# Patient Record
Sex: Female | Born: 1935 | Race: White | Hispanic: No | Smoking: Former smoker
Health system: Southern US, Community
[De-identification: ages and names within clinical notes are randomized; demographics above are authoritative.]

## PROBLEM LIST (undated history)

## (undated) DIAGNOSIS — E785 Hyperlipidemia, unspecified: Secondary | ICD-10-CM

## (undated) DIAGNOSIS — R32 Unspecified urinary incontinence: Secondary | ICD-10-CM

## (undated) DIAGNOSIS — J449 Chronic obstructive pulmonary disease, unspecified: Secondary | ICD-10-CM

## (undated) DIAGNOSIS — E039 Hypothyroidism, unspecified: Secondary | ICD-10-CM

## (undated) DIAGNOSIS — I5032 Chronic diastolic (congestive) heart failure: Secondary | ICD-10-CM

## (undated) DIAGNOSIS — I10 Essential (primary) hypertension: Secondary | ICD-10-CM

---

## 2020-01-04 LAB — PULMONARY FUNCTION TEST

## 2021-02-20 ENCOUNTER — Ambulatory Visit (INDEPENDENT_AMBULATORY_CARE_PROVIDER_SITE_OTHER): Payer: Medicare Other | Admitting: Orthopaedic Surgery

## 2021-02-20 ENCOUNTER — Encounter: Payer: Self-pay | Admitting: Orthopaedic Surgery

## 2021-02-20 ENCOUNTER — Ambulatory Visit (INDEPENDENT_AMBULATORY_CARE_PROVIDER_SITE_OTHER): Payer: Medicare Other

## 2021-02-20 ENCOUNTER — Other Ambulatory Visit: Payer: Self-pay

## 2021-02-20 DIAGNOSIS — G8929 Other chronic pain: Secondary | ICD-10-CM | POA: Diagnosis not present

## 2021-02-20 DIAGNOSIS — M545 Low back pain, unspecified: Secondary | ICD-10-CM

## 2021-02-20 NOTE — Progress Notes (Signed)
Office Visit Note   Patient: Valerie Bowman           Date of Birth: October 17, 1935           MRN: [id number] Visit Date: 02/20/2021              Requested by: No referring provider defined for this encounter. PCP: No primary care provider on file.   Assessment & Plan: Visit Diagnoses:  1. Chronic right-sided low back pain, unspecified whether sciatica present   2. Chronic right-sided low back pain without sciatica     Plan: Valerie Bowman is 85 years old and accompanied by her daughter.  She is presently a resident at assisted living at the [facility] facility.  She notes she has a chronic history of instability and balance issues and uses a walker but is complaining of right-sided low back pain.  She does not have any radicular pain.  X-rays demonstrate diffuse degenerative changes lumbar spine with significant narrowing of the disc spaces without listhesis.  I suspect her pain is related to her lumbar spine and the degenerative changes.  We will try a course of physical therapy at [facility] and obtain an MRI scan of the lumbar spine as she might be a candidate for an epidural steroid injection.  All questions were answered  Follow-Up Instructions: Return After MRI scan lumbar spine.   Orders:  Orders Placed This Encounter  Procedures   XR Lumbar Spine 2-3 Views   XR Pelvis 1-2 Views   No orders of the defined types were placed in this encounter.     Procedures: No procedures performed   Clinical Data: No additional findings.   Subjective: Chief Complaint  Patient presents with   Lower Back - Pain  Patient presents today for right lower back pain. She said that it has been hurting for a year. No known injury. Her pain stays in her back, but is worse with standing. She get relief with sitting and lying down. No previous back surgery. She lives at [facility]. She takes over the counter pain medicine as needed.   HPI  Review of Systems   Objective: Vital Signs:  There were no vitals taken for this visit.  Physical Exam Constitutional:      Appearance: She is well-developed.  Eyes:     Pupils: Pupils are equal, round, and reactive to light.  Pulmonary:     Effort: Pulmonary effort is normal.  Skin:    General: Skin is warm and dry.  Neurological:     Mental Status: She is alert and oriented to person, place, and time.  Psychiatric:        Behavior: Behavior normal.    Ortho Exam awake alert and oriented x3.  Comfortable sitting.  Does use in a walker for ambulation for balance problems.  Straight leg raise negative.  Reflexes are symmetrical.  Does have some altered sensibility at the tips of her toes related to neuropathy but appear to have good strength.  No pain with range of motion of either hip or loss of motion.  No knee pain.  No localized areas of tenderness about either thigh or leg.  Very minimal percussible tenderness of lumbar spine.  No pain over either SI joint  Specialty Comments:  No specialty comments available.  Imaging: XR Lumbar Spine 2-3 Views  Result Date: 02/20/2021 AP and lateral lumbar spine demonstrates a very minimal right-sided degenerative scoliosis.  There is diffuse degenerative disc disease throughout the  lumbar spine with anterior and posterior osteophytes and subchondral sclerosis.  All consistent with advanced degenerative arthritis.  No evidence of compression fracture.  Diffuse calcification of the abdominal aorta without aneurysmal dilatation.  XR Pelvis 1-2 Views  Result Date: 02/20/2021 AP pelvis demonstrates mild degenerative changes of the right and left hip.  No ectopic calcification or acute changes.  Very minimal decrease in the joint space on the right.  There is some bony protuberance of the acetabulum    PMFS History: Patient Active Problem List   Diagnosis Date Noted   Low back pain 02/20/2021   History reviewed. No pertinent past medical history.  History reviewed. No pertinent family  history.  History reviewed. No pertinent surgical history. Social History   Occupational History   Not on file  Tobacco Use   Smoking status: Not on file   Smokeless tobacco: Not on file  Substance and Sexual Activity   Alcohol use: Not on file   Drug use: Not on file   Sexual activity: Not on file

## 2021-02-20 NOTE — Addendum Note (Signed)
Addended by: Wendi Maya on: 02/20/2021 04:45 PM   Modules accepted: Orders

## 2021-02-27 ENCOUNTER — Other Ambulatory Visit: Payer: Self-pay

## 2021-02-27 ENCOUNTER — Encounter (HOSPITAL_COMMUNITY): Payer: Self-pay

## 2021-02-27 ENCOUNTER — Inpatient Hospital Stay (HOSPITAL_COMMUNITY)
Admission: EM | Admit: 2021-02-27 | Discharge: 2021-03-01 | DRG: 190 | Disposition: A | Discharge status: Home / Self Care | Payer: Medicare Other | Attending: Family Medicine | Admitting: Family Medicine

## 2021-02-27 ENCOUNTER — Emergency Department (HOSPITAL_COMMUNITY): Payer: Medicare Other

## 2021-02-27 DIAGNOSIS — E785 Hyperlipidemia, unspecified: Secondary | ICD-10-CM | POA: Diagnosis present

## 2021-02-27 DIAGNOSIS — I10 Essential (primary) hypertension: Secondary | ICD-10-CM | POA: Diagnosis present

## 2021-02-27 DIAGNOSIS — I110 Hypertensive heart disease with heart failure: Secondary | ICD-10-CM | POA: Diagnosis present

## 2021-02-27 DIAGNOSIS — Z87891 Personal history of nicotine dependence: Secondary | ICD-10-CM | POA: Diagnosis not present

## 2021-02-27 DIAGNOSIS — J9601 Acute respiratory failure with hypoxia: Secondary | ICD-10-CM | POA: Diagnosis present

## 2021-02-27 DIAGNOSIS — J449 Chronic obstructive pulmonary disease, unspecified: Secondary | ICD-10-CM | POA: Diagnosis present

## 2021-02-27 DIAGNOSIS — Z79890 Hormone replacement therapy (postmenopausal): Secondary | ICD-10-CM | POA: Diagnosis not present

## 2021-02-27 DIAGNOSIS — E039 Hypothyroidism, unspecified: Secondary | ICD-10-CM | POA: Diagnosis present

## 2021-02-27 DIAGNOSIS — J00 Acute nasopharyngitis [common cold]: Secondary | ICD-10-CM | POA: Diagnosis present

## 2021-02-27 DIAGNOSIS — Z66 Do not resuscitate: Secondary | ICD-10-CM | POA: Diagnosis present

## 2021-02-27 DIAGNOSIS — I5032 Chronic diastolic (congestive) heart failure: Secondary | ICD-10-CM | POA: Diagnosis present

## 2021-02-27 DIAGNOSIS — Z9981 Dependence on supplemental oxygen: Secondary | ICD-10-CM

## 2021-02-27 DIAGNOSIS — R0602 Shortness of breath: Secondary | ICD-10-CM | POA: Diagnosis present

## 2021-02-27 DIAGNOSIS — Z20822 Contact with and (suspected) exposure to covid-19: Secondary | ICD-10-CM | POA: Diagnosis present

## 2021-02-27 DIAGNOSIS — J441 Chronic obstructive pulmonary disease with (acute) exacerbation: Principal | ICD-10-CM | POA: Diagnosis present

## 2021-02-27 HISTORY — DX: Hyperlipidemia, unspecified: E78.5

## 2021-02-27 HISTORY — DX: Unspecified urinary incontinence: R32

## 2021-02-27 HISTORY — DX: Chronic obstructive pulmonary disease, unspecified: J44.9

## 2021-02-27 HISTORY — DX: Chronic diastolic (congestive) heart failure: I50.32

## 2021-02-27 HISTORY — DX: Hypothyroidism, unspecified: E03.9

## 2021-02-27 HISTORY — DX: Essential (primary) hypertension: I10

## 2021-02-27 LAB — RESP PANEL BY RT-PCR (FLU A&B, COVID) ARPGX2
Influenza A by PCR: NEGATIVE
Influenza B by PCR: NEGATIVE
SARS Coronavirus 2 by RT PCR: NEGATIVE

## 2021-02-27 LAB — CBC WITH DIFFERENTIAL/PLATELET
Abs Immature Granulocytes: 0.03 10*3/uL (ref 0.00–0.07)
Basophils Absolute: 0.1 10*3/uL (ref 0.0–0.1)
Basophils Relative: 1 %
Eosinophils Absolute: 0.4 10*3/uL (ref 0.0–0.5)
Eosinophils Relative: 4 %
HCT: 37.7 % (ref 36.0–46.0)
Hemoglobin: 12.2 g/dL (ref 12.0–15.0)
Immature Granulocytes: 0 %
Lymphocytes Relative: 17 %
Lymphs Abs: 1.6 10*3/uL (ref 0.7–4.0)
MCH: 31.4 pg (ref 26.0–34.0)
MCHC: 32.4 g/dL (ref 30.0–36.0)
MCV: 96.9 fL (ref 80.0–100.0)
Monocytes Absolute: 0.7 10*3/uL (ref 0.1–1.0)
Monocytes Relative: 8 %
Neutro Abs: 6.5 10*3/uL (ref 1.7–7.7)
Neutrophils Relative %: 70 %
Platelets: 237 10*3/uL (ref 150–400)
RBC: 3.89 MIL/uL (ref 3.87–5.11)
RDW: 14.2 % (ref 11.5–15.5)
WBC: 9.3 10*3/uL (ref 4.0–10.5)
nRBC: 0 % (ref 0.0–0.2)

## 2021-02-27 LAB — BASIC METABOLIC PANEL
Anion gap: 7 (ref 5–15)
BUN: 21 mg/dL (ref 8–23)
CO2: 32 mmol/L (ref 22–32)
Calcium: 9 mg/dL (ref 8.9–10.3)
Chloride: 96 mmol/L — ABNORMAL LOW (ref 98–111)
Creatinine, Ser: 0.64 mg/dL (ref 0.44–1.00)
GFR, Estimated: >60 mL/min (ref >60)
Glucose, Bld: 116 mg/dL — ABNORMAL HIGH (ref 70–99)
Potassium: 3.8 mmol/L (ref 3.5–5.1)
Sodium: 135 mmol/L (ref 135–145)

## 2021-02-27 LAB — BRAIN NATRIURETIC PEPTIDE: B Natriuretic Peptide: 72.7 pg/mL (ref 0.0–100.0)

## 2021-02-27 LAB — TROPONIN I (HIGH SENSITIVITY)
Troponin I (High Sensitivity): 4 ng/L (ref <18)
Troponin I (High Sensitivity): 4 ng/L (ref <18)

## 2021-02-27 IMAGING — DX DG CHEST 1V PORT
1 series · 1 of 1 positions shown · non-contrast
Comparison: None.

CLINICAL DATA: Shortness of breath and wheezing

EXAM:
PORTABLE CHEST 1 VIEW

[chest ap]
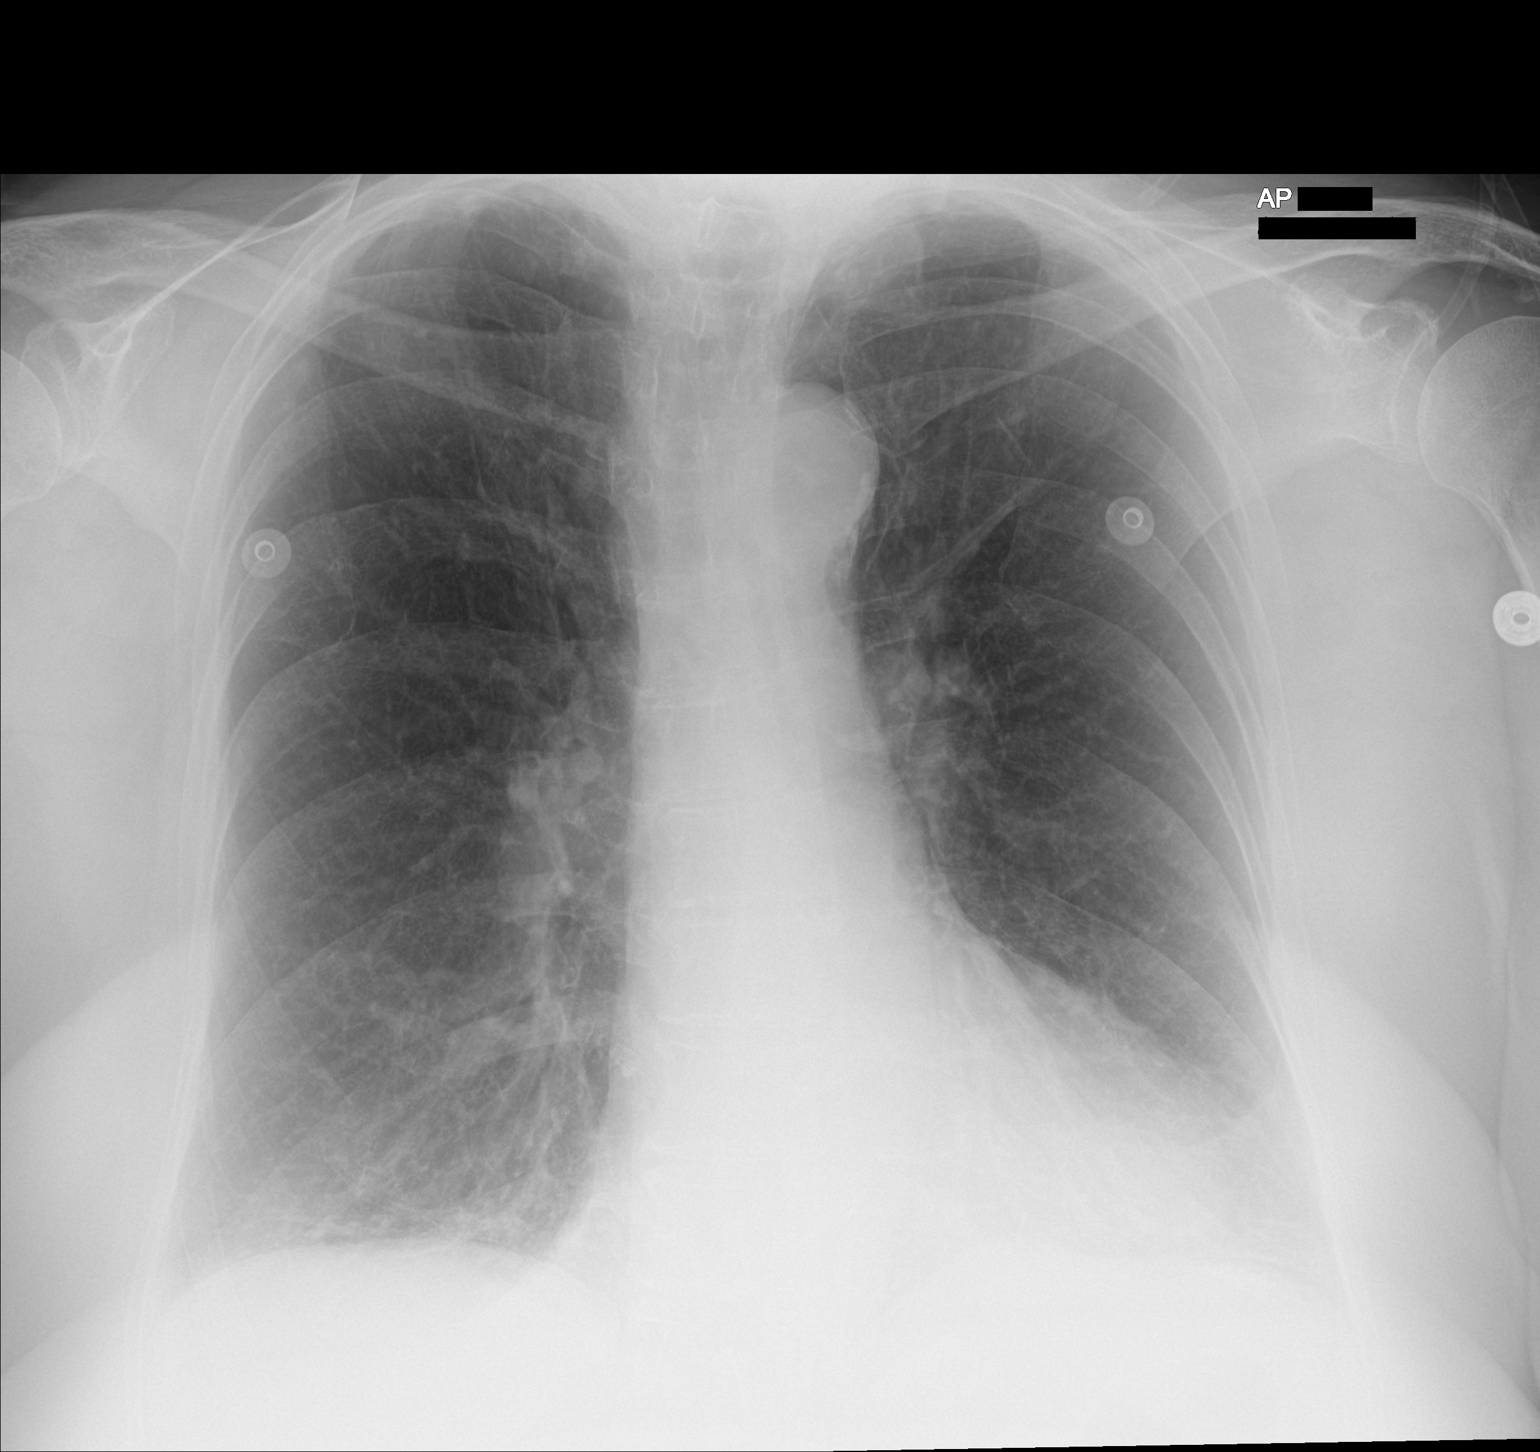

[1 of 1 positions shown; findings below may reference images not displayed]

FINDINGS: Cardiac shadow is within normal limits. Aortic calcifications are
noted. Lungs are hyper aerated bilaterally. No focal infiltrate or
sizable effusion is seen. Mild apical scarring is noted bilaterally.
IMPRESSION: No acute abnormality noted. Changes of COPD without acute
abnormality.

## 2021-02-27 MED ORDER — SODIUM CHLORIDE 0.9 % IV SOLN
500.0000 mg | INTRAVENOUS | Status: DC
  Administered 2021-02-27: 23:00:00 500 mg via INTRAVENOUS
  Filled 2021-02-27: qty 5

## 2021-02-27 MED ORDER — ASPIRIN 81 MG PO CHEW: 81.0000 mg | CHEWABLE_TABLET | Freq: Once | ORAL | Status: DC

## 2021-02-27 MED ORDER — ACETAMINOPHEN 650 MG RE SUPP: 650.0000 mg | Freq: Four times a day (QID) | RECTAL | Status: DC | PRN

## 2021-02-27 MED ORDER — ALBUTEROL SULFATE (2.5 MG/3ML) 0.083% IN NEBU
2.5000 mg | INHALATION_SOLUTION | Freq: Once | RESPIRATORY_TRACT | Status: AC
  Administered 2021-02-27: 21:00:00 2.5 mg via RESPIRATORY_TRACT
  Filled 2021-02-27: qty 3

## 2021-02-27 MED ORDER — METHYLPREDNISOLONE SODIUM SUCC 125 MG IJ SOLR
80.0000 mg | Freq: Four times a day (QID) | INTRAMUSCULAR | Status: DC
  Administered 2021-02-28 (×2): 80 mg via INTRAVENOUS
  Filled 2021-02-27 (×2): qty 2

## 2021-02-27 MED ORDER — IPRATROPIUM-ALBUTEROL 0.5-2.5 (3) MG/3ML IN SOLN
3.0000 mL | Freq: Four times a day (QID) | RESPIRATORY_TRACT | Status: DC
  Filled 2021-02-27: qty 3

## 2021-02-27 MED ORDER — ENALAPRIL MALEATE 10 MG PO TABS
20.0000 mg | ORAL_TABLET | Freq: Two times a day (BID) | ORAL | Status: DC
  Administered 2021-02-28 – 2021-03-01 (×3): 20 mg via ORAL
  Filled 2021-02-27 (×4): qty 2

## 2021-02-27 MED ORDER — ACETAMINOPHEN 325 MG PO TABS: 650.0000 mg | ORAL_TABLET | Freq: Four times a day (QID) | ORAL | Status: DC | PRN

## 2021-02-27 MED ORDER — ALBUTEROL SULFATE (2.5 MG/3ML) 0.083% IN NEBU: 2.5000 mg | INHALATION_SOLUTION | RESPIRATORY_TRACT | Status: DC | PRN

## 2021-02-27 NOTE — ED Triage Notes (Signed)
EMS reports from [facility], c/o SOB since last night, Hx of COPD. Used inhalers today with some relief. Pt uses 2ltrs at night. Wheezes noted.  BP 170/74 HR 90 RR 24 Sp02 96  18ga LAC 5mg  Albuterol 0.5mg  Atrovent 125mg  Solumedrol given enroute with effect

## 2021-02-27 NOTE — ED Provider Notes (Signed)
[facility] EMERGENCY DEPARTMENT Provider Note  CSN: [id number] Arrival date & time: 02/27/21 1803    History Chief Complaint  Patient presents with   Shortness of Breath    Valerie Bowman is a 85 y.o. female who recently moved from [place] to ALF at [facility] here with increasing SOB and wheezing since last night. She is unsure if she was running a fever or coughing. She has history of COPD, on oxygen at night, Stiolto and albuterol. She was given DuoNeb and solumedrol with EMS and reports improvement. Per paperwork from ALF, she was hypoxic on room air earlier today, now 93% on RA. Denies CP, no prior cardiac history.    History reviewed. No pertinent past medical history.  History reviewed. No pertinent surgical history.  History reviewed. No pertinent family history.      Home Medications Prior to Admission medications   Not on File     Allergies    Patient has no allergy information on record.   Review of Systems   Review of Systems A comprehensive review of systems was completed and negative except as noted in HPI.    Physical Exam BP (!) 151/76    Pulse 85    Temp 97.9 F (36.6 C) (Oral)    Resp 19    Ht 5\' 7"  (1.702 m)    Wt 85.3 kg    SpO2 93%    BMI 29.44 kg/m   Physical Exam Vitals and nursing note reviewed.  Constitutional:      Appearance: Normal appearance.  HENT:     Head: Normocephalic and atraumatic.     Nose: Nose normal.     Mouth/Throat:     Mouth: Mucous membranes are moist.  Eyes:     Extraocular Movements: Extraocular movements intact.     Conjunctiva/sclera: Conjunctivae normal.  Cardiovascular:     Rate and Rhythm: Normal rate.  Pulmonary:     Effort: Pulmonary effort is normal.     Breath sounds: Decreased breath sounds, wheezing and rhonchi present.  Abdominal:     General: Abdomen is flat.     Palpations: Abdomen is soft.     Tenderness: There is no abdominal tenderness.  Musculoskeletal:        General: No swelling.  Normal range of motion.     Cervical back: Neck supple.     Right lower leg: No edema.     Left lower leg: No edema.  Skin:    General: Skin is warm and dry.  Neurological:     General: No focal deficit present.     Mental Status: She is alert.  Psychiatric:        Mood and Affect: Mood normal.     ED Results / Procedures / Treatments   Labs (all labs ordered are listed, but only abnormal results are displayed) Labs Reviewed  BASIC METABOLIC PANEL - Abnormal; Notable for the following components:      Result Value   Chloride 96 (*)    Glucose, Bld 116 (*)    All other components within normal limits  RESP PANEL BY RT-PCR (FLU A&B, COVID) [id number]  BRAIN NATRIURETIC PEPTIDE  CBC WITH DIFFERENTIAL/PLATELET  TROPONIN I (HIGH SENSITIVITY)  TROPONIN I (HIGH SENSITIVITY)    EKG EKG Interpretation  Date/Time:  Tuesday February 27 2021 19:39:36 EST Ventricular Rate:  87 PR Interval:  175 QRS Duration: 96 QT Interval:  367 QTC Calculation: 442 R Axis:   64 Text Interpretation: Sinus rhythm  Normal ECG No old tracing to compare Confirmed by Susy Frizzle [phone]) on 02/27/2021 8:25:33 PM  Radiology DG Chest Port 1 View  Result Date: 02/27/2021 CLINICAL DATA:  Shortness of breath and wheezing EXAM: PORTABLE CHEST 1 VIEW COMPARISON:  None. FINDINGS: Cardiac shadow is within normal limits. Aortic calcifications are noted. Lungs are hyper aerated bilaterally. No focal infiltrate or sizable effusion is seen. Mild apical scarring is noted bilaterally. IMPRESSION: No acute abnormality noted. Changes of COPD without acute abnormality. Electronically Signed   By: Alcide Clever M.D.   On: 02/27/2021 19:18    Procedures Procedures  Medications Ordered in the ED Medications  albuterol (PROVENTIL) (2.5 MG/3ML) 0.083% nebulizer solution 2.5 mg (2.5 mg Nebulization Given 02/27/21 2054)     MDM Rules/Calculators/A&P MDM Patient with SOB and cough. Will check labs, CXR, EKG, Covid swab.  She appears more comfortable now after nebs enroute.   ED Course  I have reviewed the triage vital signs and the nursing notes.  Pertinent labs & imaging results that were available during my care of the patient were reviewed by me and considered in my medical decision making (see chart for details).  Clinical Course as of 02/27/21 2207  Tue Feb 27, 2021  1930 CXR is clear. CBC is normal.  [CS]  2024 BMP, Trop and BNP are neg. Covid/Flu neg.  [CS]  2033 Wheezing is improved, borderline SpO2 93% on 2L [place]. Air movement is diminished. Will give another neb and then evaluate for dispo.  [CS]  2137 Repeat Trop remains neg.  [CS]  2142 Patient reports dyspnea with walking, SpO2 down to 88% with ambulation. She typically does not require oxygen except when asleep so this is an increase in her usual O2 requirement. Will discuss admission with Hospitalist.  [CS]  2206 Spoke with Dr. Arlean Hopping, Hospitalist, who will evaluate for admission.  [CS]    Clinical Course User Index [CS] [name], MD    Final Clinical Impression(s) / ED Diagnoses Final diagnoses:  COPD exacerbation [facility])    Rx / DC Orders ED Discharge Orders     None        [name], MD 02/27/21 2207

## 2021-02-27 NOTE — H&P (Signed)
History and Physical    PLEASE NOTE THAT DRAGON DICTATION SOFTWARE WAS USED IN THE CONSTRUCTION OF THIS NOTE.   Valerie Bowman ZOX:[id number] DOB: 1935/07/04 DOA: 02/27/2021  PCP: Herschel Senegal, MD  Patient coming from: home   I have personally briefly reviewed patient's old medical records in [facility]  Chief Complaint: Shortness of breath  HPI: Valerie Bowman is a 85 y.o. female with medical history significant for COPD, essential hypertension, chronic diastolic heart failure, hyperlipidemia, who is admitted to [facility] on 02/27/2021 with acute COPD exacerbation associated with acute hypoxic respiratory distress after presenting from home to [facility] ED complaining of shortness of breath.   The patient reports 2 days of progressive shortness of breath associated with new onset wheezing as well as new onset nonproductive cough.  Denies any associated orthopnea, PND, or new onset peripheral edema.  She also denies any associated chest pain, diaphoresis, palpitations, nausea, vomiting.  No recent hemoptysis, new lower extremity erythema, or calf tenderness.  She also denies any associated subjective fever, chills, rigors, or generalized myalgias.  No recent trauma or rash.   She confirms a known history of COPD and reports good compliance with her outpatient respiratory regimen which includes scheduled Stioloto as well as as needed albuterol inhaler.  She confirms that she is a former smoker, having completely quit smoking in 1995 after previously smoking approximately pack per day over the preceding 30 years.  Over the last 2 days, she notes increasing frequency of use of her prn albuterol inhaler, but notes no significant improvement in her shortness of breath with this measure.    The patient reports that she uses 2 L nasal cannula at night only, denying any baseline diurnal supplemental oxygen requirements.  Of note, the patient recently moved to [place] from the state of  [place] in order to be closer to her daughter.      ED Course:  Vital signs in the ED were notable for the following: Afebrile; heart rate 82-95; blood pressure 126/67 -151/76; respiratory rate 15-20, oxygen saturation initially noted to be 88% on room air, with subsequent provement to 93 to 97% on 2 L nasal cannula.  Labs were notable for the following: BMP notable for bicarbonate 3.  BNP 73, without prior data point of the lower point of comparison.  High-sensitivity troponin I x2 values were both found to be 4.  CBC notable for platelets of 2300, hemoglobin 12.2.  COVID-19/[place] PCR found to be negative.  Imaging and additional notable ED work-up: Sinus rhythm with heart rate 87, normal intervals, no evidence of T wave or ST changes.  Chest x-ray shows no evidence of acute cardiopulmonary process, including no evidence of infiltrate, edema, effusion, or pneumothorax.  While in the ED, the following were administered: Albuterol nebulizer x1.  Subsequently, the patient is admitted to the med telemetry unit at [facility] for further evaluation management of acute COPD exacerbation associated with acute hypoxic respiratory distress.     Review of Systems: As per HPI otherwise 10 point review of systems negative.   Past Medical History:  Diagnosis Date   Acquired hypothyroidism    Chronic diastolic heart failure (HCC)    COPD (chronic obstructive pulmonary disease) (HCC)    Essential hypertension    Hyperlipidemia    Urinary incontinence     History reviewed. No pertinent surgical history.  Social History:  reports that she quit smoking about 27 years ago. Her smoking use included cigarettes. She has a  30.00 pack-year smoking history. She has never used smokeless tobacco. She reports that she does not currently use alcohol. She reports that she does not use drugs.   Not on File  History reviewed. No pertinent family history.   Home medications: Outpatient medications include  prn albuterol, enalapril, Norvasc, daily baby aspirin, Synthroid, Toprol-XL.   Objective    Physical Exam: Vitals:   02/27/21 2000 02/27/21 2030 02/27/21 2100 02/27/21 2134  BP: 126/67 (!) 147/72 129/64 (!) 151/76  Pulse: 85 84 84 85  Resp: 20 15 19 19   Temp:      TempSrc:      SpO2: 92% 94% 97% 93%  Weight:      Height:        General: appears to be stated age; alert, oriented; mildly increased work of breathing noted Skin: warm, dry, no rash Head:  AT/[place] Mouth:  Oral mucosa membranes appear moist, normal dentition Neck: supple; trachea midline Heart:  RRR; did not appreciate any M/R/G Lungs: Bilateral expiratory wheezes appreciated, otherwise, CTAB, did not appreciate any, rales, or rhonchi Abdomen: + BS; soft, ND, NT Vascular: 2+ pedal pulses b/l; 2+ radial pulses b/l Extremities: no peripheral edema, no muscle wasting Neuro: strength and sensation intact in upper and lower extremities b/l     Labs on Admission: I have personally reviewed following labs and imaging studies  CBC: Recent Labs  Lab 02/27/21 1829  WBC 9.3  NEUTROABS 6.5  HGB 12.2  HCT 37.7  MCV 96.9  PLT 237   Basic Metabolic Panel: Recent Labs  Lab 02/27/21 1829  NA 135  K 3.8  CL 96*  CO2 32  GLUCOSE 116*  BUN 21  CREATININE 0.64  CALCIUM 9.0   GFR: Estimated Creatinine Clearance: 59.8 mL/min (by C-G formula based on SCr of 0.64 mg/dL). Liver Function Tests: No results for input(s): AST, ALT, ALKPHOS, BILITOT, PROT, ALBUMIN in the last 168 hours. No results for input(s): LIPASE, AMYLASE in the last 168 hours. No results for input(s): AMMONIA in the last 168 hours. Coagulation Profile: No results for input(s): INR, PROTIME in the last 168 hours. Cardiac Enzymes: No results for input(s): CKTOTAL, CKMB, CKMBINDEX, TROPONINI in the last 168 hours. BNP (last 3 results) No results for input(s): PROBNP in the last 8760 hours. HbA1C: No results for input(s): HGBA1C in the last 72  hours. CBG: No results for input(s): GLUCAP in the last 168 hours. Lipid Profile: No results for input(s): CHOL, HDL, LDLCALC, TRIG, CHOLHDL, LDLDIRECT in the last 72 hours. Thyroid Function Tests: No results for input(s): TSH, T4TOTAL, FREET4, T3FREE, THYROIDAB in the last 72 hours. Anemia Panel: No results for input(s): VITAMINB12, FOLATE, FERRITIN, TIBC, IRON, RETICCTPCT in the last 72 hours. Urine analysis: No results found for: COLORURINE, APPEARANCEUR, LABSPEC, PHURINE, GLUCOSEU, HGBUR, BILIRUBINUR, KETONESUR, PROTEINUR, UROBILINOGEN, NITRITE, LEUKOCYTESUR  Radiological Exams on Admission: DG Chest Port 1 View  Result Date: 02/27/2021 CLINICAL DATA:  Shortness of breath and wheezing EXAM: PORTABLE CHEST 1 VIEW COMPARISON:  None. FINDINGS: Cardiac shadow is within normal limits. Aortic calcifications are noted. Lungs are hyper aerated bilaterally. No focal infiltrate or sizable effusion is seen. Mild apical scarring is noted bilaterally. IMPRESSION: No acute abnormality noted. Changes of COPD without acute abnormality. Electronically Signed   By: 03/01/2021 M.D.   On: 02/27/2021 19:18     EKG: Independently reviewed, with result as described above.    Assessment/Plan    Principal Problem:   Acute exacerbation of chronic obstructive pulmonary disease (  COPD) (HCC) Active Problems:   SOB (shortness of breath)   Acquired hypothyroidism   Essential hypertension   Chronic diastolic heart failure (HCC)     #) Acute COPD exacerbation: In the setting of a known history of COPD, the patient presents with 2 days of progressive shortness of breath with associated new onset wheezing, with chest x-ray showed no evidence of acute cardiac pulmonary process.  In the context of no known baseline diet enteral supplemental oxygen requirements, this presentation is also associated with acute hypoxic respiratory distress, as further detailed below.  Unclear etiology leading to her exacerbation,  with clear chest x-ray on presentation, underlying infectious etiology, and compliance with home respiratory regimen, as above.  She confirms that she is a former smoker, having completely quit smoking greater than 25 years ago.   Plan: Scheduled DuoNebs, as needed albuterol nebulizers, Solu-Medrol, azithromycin.  Check VBG.  Monitor on telemetry.  Monitor continuous pulse oximetry.  Add on procalcitonin.  Repeat BMP/CBC in the morning.  Add on serum magnesium level.  Check serum phosphorus level.       #) Acute hypoxic respiratory distress: in the context of acute respiratory symptoms and no known baseline diurnal supplemental O2 requirements, presenting O2 sat of 88% on room air, with subsequent improvement into the mid 90s on 2 L nasal cannula, thereby meeting criteria for acute hypoxic respiratory distress as opposed to acute hypoxic respiratory failure at this time. Appears to be on basis of acute COPD exacerbation, as above.  Presenting CXR shows no evidence of acute cardiopulmonary process, including no evidence of infiltrate, edema, effusion, or pneumothorax.   In terms of other considered etiologies, ACS appears less likely at this time in the absence of any recent CP and in the context of negative troponin and presenting EKG showing no e/o acute ischemic process. No clinical or radiographic evidence to suggest acutely decompensated heart failure at this time. Clinically, presentation is less suggestive of acute PE at this time. COVID-19/Influenza PCR were negative when checked today.   Plan: further evaluation/management of presenting acute COPD exacerbation, as above, including scheduled nebs, as needed nebs, steroids. Monitor continuous pulse ox with prn supplemental O2 to maintain O2 sats greater than or equal to 92%. monitor on telemetry. CMP/CBC in the AM. Check serum Mg and Phos levels. Check blood gas.  incentive spirometry.  Add on procalcitonin level.       #) Essential  Hypertension: documented h/o such, with outpatient antihypertensive regimen including enalapril and Norvasc.  SBP's in the ED today: 120s to 150s mmHg.   Plan: Close monitoring of subsequent BP via routine VS. resume home antihypertensive medications       #) Chronic diastolic heart failure: Per chart review, I found documentation of a history of chronic diastolic heart failure although been unable to locate specific prior echocardiogram results.  Patient conveys that she is on no scheduled diuretic medications at home. No clinical, laboratory, or radiographic evidence to suggest acutely decompensated heart failure at this time.     Plan: monitor strict I's & O's and daily weights. Repeat BMP in AM. Check serum mag level.        #) acquired hypothyroidism: documented h/o such, on Synthroid as outpatient.   Plan: cont home Synthroid.      DVT prophylaxis: SCD's   Code Status: DNR (per my discussions with the patient this evening) Family Communication: I discussed the patient's case with her daughter, who is present at bedside Disposition Plan: Per Rounding  Team Consults called: none;  Admission status: Inpatient; med telemetry  Warrants inpatient status on basis of further evaluation and management of acute hypoxic respiratory distress in setting of acute COPD exacerbation requiring escalation of respiratory management given progression of her shortness of breath as an outpatient and spite of increased frequency of prn albuterol use, including provision of supplemental oxygen in the context of no known baseline diet and renal O2 demands.   PLEASE NOTE THAT DRAGON DICTATION SOFTWARE WAS USED IN THE CONSTRUCTION OF THIS NOTE.   Chaney Born Nickholas Goldston DO [facility] From 7PM - 7AM   02/27/2021, 10:12 PM

## 2021-02-27 NOTE — ED Notes (Signed)
Per pt, pt ambulates w/ RW on RA @ baseline. Pt able to ambulate w limited assist on RA. O2 sat > 91%. Noted O2 desat to 88% post-ambulation. Pt placed back on 2L. O2 sat up to 95%.

## 2021-02-28 DIAGNOSIS — I10 Essential (primary) hypertension: Secondary | ICD-10-CM | POA: Diagnosis present

## 2021-02-28 DIAGNOSIS — E039 Hypothyroidism, unspecified: Secondary | ICD-10-CM | POA: Diagnosis present

## 2021-02-28 DIAGNOSIS — I5032 Chronic diastolic (congestive) heart failure: Secondary | ICD-10-CM | POA: Diagnosis present

## 2021-02-28 DIAGNOSIS — R0602 Shortness of breath: Secondary | ICD-10-CM | POA: Diagnosis present

## 2021-02-28 LAB — COMPREHENSIVE METABOLIC PANEL
ALT: 16 U/L (ref 0–44)
AST: 19 U/L (ref 15–41)
Albumin: 3.6 g/dL (ref 3.5–5.0)
Alkaline Phosphatase: 59 U/L (ref 38–126)
Anion gap: 7 (ref 5–15)
BUN: 24 mg/dL — ABNORMAL HIGH (ref 8–23)
CO2: 32 mmol/L (ref 22–32)
Calcium: 8.9 mg/dL (ref 8.9–10.3)
Chloride: 97 mmol/L — ABNORMAL LOW (ref 98–111)
Creatinine, Ser: 0.68 mg/dL (ref 0.44–1.00)
GFR, Estimated: >60 mL/min (ref >60)
Glucose, Bld: 146 mg/dL — ABNORMAL HIGH (ref 70–99)
Potassium: 4.2 mmol/L (ref 3.5–5.1)
Sodium: 136 mmol/L (ref 135–145)
Total Bilirubin: 0.7 mg/dL (ref 0.3–1.2)
Total Protein: 6.7 g/dL (ref 6.5–8.1)

## 2021-02-28 LAB — CBC WITH DIFFERENTIAL/PLATELET
Abs Immature Granulocytes: 0.02 10*3/uL (ref 0.00–0.07)
Basophils Absolute: 0 10*3/uL (ref 0.0–0.1)
Basophils Relative: 0 %
Eosinophils Absolute: 0 10*3/uL (ref 0.0–0.5)
Eosinophils Relative: 0 %
HCT: 37.6 % (ref 36.0–46.0)
Hemoglobin: 12.1 g/dL (ref 12.0–15.0)
Immature Granulocytes: 0 %
Lymphocytes Relative: 5 %
Lymphs Abs: 0.4 10*3/uL — ABNORMAL LOW (ref 0.7–4.0)
MCH: 30.9 pg (ref 26.0–34.0)
MCHC: 32.2 g/dL (ref 30.0–36.0)
MCV: 96.2 fL (ref 80.0–100.0)
Monocytes Absolute: 0.1 10*3/uL (ref 0.1–1.0)
Monocytes Relative: 1 %
Neutro Abs: 7.3 10*3/uL (ref 1.7–7.7)
Neutrophils Relative %: 94 %
Platelets: 190 10*3/uL (ref 150–400)
RBC: 3.91 MIL/uL (ref 3.87–5.11)
RDW: 14.1 % (ref 11.5–15.5)
WBC: 7.8 10*3/uL (ref 4.0–10.5)
nRBC: 0 % (ref 0.0–0.2)

## 2021-02-28 LAB — BLOOD GAS, VENOUS
Acid-Base Excess: 5.9 mmol/L — ABNORMAL HIGH (ref 0.0–2.0)
Bicarbonate: 32.1 mmol/L — ABNORMAL HIGH (ref 20.0–28.0)
O2 Saturation: 96 %
Patient temperature: 98.6
pCO2, Ven: 56 mmHg (ref 44.0–60.0)
pH, Ven: 7.376 (ref 7.250–7.430)
pO2, Ven: 90.3 mmHg — ABNORMAL HIGH (ref 32.0–45.0)

## 2021-02-28 LAB — PROCALCITONIN: Procalcitonin: <0.1 ng/mL

## 2021-02-28 LAB — PHOSPHORUS: Phosphorus: 3.2 mg/dL (ref 2.5–4.6)

## 2021-02-28 LAB — MAGNESIUM: Magnesium: 2.1 mg/dL (ref 1.7–2.4)

## 2021-02-28 MED ORDER — ORAL CARE MOUTH RINSE
15.0000 mL | Freq: Two times a day (BID) | OROMUCOSAL | Status: DC
  Administered 2021-03-01: 12:00:00 15 mL via OROMUCOSAL

## 2021-02-28 MED ORDER — AZITHROMYCIN 250 MG PO TABS
500.0000 mg | ORAL_TABLET | Freq: Every day | ORAL | Status: DC
  Administered 2021-02-28: 21:00:00 500 mg via ORAL
  Filled 2021-02-28: qty 2

## 2021-02-28 MED ORDER — SENNA 8.6 MG PO TABS
1.0000 | ORAL_TABLET | Freq: Two times a day (BID) | ORAL | Status: DC
Start: 2021-02-28 — End: 2021-03-01
  Administered 2021-02-28 – 2021-03-01 (×3): 8.6 mg via ORAL
  Filled 2021-02-28 (×3): qty 1

## 2021-02-28 MED ORDER — METOPROLOL SUCCINATE ER 50 MG PO TB24
50.0000 mg | ORAL_TABLET | Freq: Every day | ORAL | Status: DC
  Administered 2021-02-28: 10:00:00 50 mg via ORAL
  Filled 2021-02-28 (×2): qty 1

## 2021-02-28 MED ORDER — ASPIRIN EC 81 MG PO TBEC
81.0000 mg | DELAYED_RELEASE_TABLET | Freq: Every day | ORAL | Status: DC
Start: 2021-02-28 — End: 2021-02-28

## 2021-02-28 MED ORDER — METHYLPREDNISOLONE SODIUM SUCC 125 MG IJ SOLR
80.0000 mg | Freq: Two times a day (BID) | INTRAMUSCULAR | Status: DC
  Administered 2021-02-28 – 2021-03-01 (×2): 80 mg via INTRAVENOUS
  Filled 2021-02-28 (×2): qty 2

## 2021-02-28 MED ORDER — POLYETHYLENE GLYCOL 3350 17 G PO PACK
17.0000 g | PACK | Freq: Every day | ORAL | Status: DC
  Filled 2021-02-28: qty 1

## 2021-02-28 MED ORDER — CHLORHEXIDINE GLUCONATE 0.12 % MT SOLN
15.0000 mL | Freq: Two times a day (BID) | OROMUCOSAL | Status: DC
  Administered 2021-02-28 – 2021-03-01 (×2): 15 mL via OROMUCOSAL
  Filled 2021-02-28 (×2): qty 15

## 2021-02-28 MED ORDER — AMLODIPINE BESYLATE 5 MG PO TABS
5.0000 mg | ORAL_TABLET | Freq: Every day | ORAL | Status: DC
  Administered 2021-02-28 – 2021-03-01 (×2): 5 mg via ORAL
  Filled 2021-02-28 (×2): qty 1

## 2021-02-28 MED ORDER — IPRATROPIUM-ALBUTEROL 0.5-2.5 (3) MG/3ML IN SOLN
3.0000 mL | Freq: Three times a day (TID) | RESPIRATORY_TRACT | Status: DC
  Administered 2021-02-28 – 2021-03-01 (×4): 3 mL via RESPIRATORY_TRACT
  Filled 2021-02-28 (×4): qty 3

## 2021-02-28 MED ORDER — LEVOTHYROXINE SODIUM 125 MCG PO TABS
125.0000 ug | ORAL_TABLET | Freq: Every day | ORAL | Status: DC
  Administered 2021-02-28 – 2021-03-01 (×2): 125 ug via ORAL
  Filled 2021-02-28 (×2): qty 1

## 2021-02-28 MED ORDER — METHYLPREDNISOLONE SODIUM SUCC 40 MG IJ SOLR: 40.0000 mg | Freq: Four times a day (QID) | INTRAMUSCULAR | Status: DC

## 2021-02-28 MED ORDER — ENALAPRIL MALEATE 10 MG PO TABS: 20.0000 mg | ORAL_TABLET | Freq: Two times a day (BID) | ORAL | Status: DC

## 2021-02-28 NOTE — ED Notes (Signed)
ED TO INPATIENT HANDOFF REPORT  ED Nurse Name and Phone #: Ned Clines Name/Age/Gender Valerie Bowman 85 y.o. female Room/Bed: RESB/RESB  Code Status   Code Status: DNR  Home/SNF/Other Nursing Home Patient oriented to: self, place, and time Is this baseline? Yes   Triage Complete: Triage complete  Chief Complaint Acute exacerbation of chronic obstructive pulmonary disease (COPD) (HCC) [J44.1]  Triage Note EMS reports from [facility] [place], c/o SOB since last night, Hx of COPD. Used inhalers today with some relief. Pt uses 2ltrs at night. Wheezes noted.  BP 170/74 HR 90 RR 24 Sp02 96  18ga LAC 5mg  Albuterol 0.5mg  Atrovent 125mg  Solumedrol given enroute with effect   Allergies Allergies  Allergen Reactions   Penicillins     Other reaction(s): Other (see comments) Childhood    Sulfa Antibiotics     Level of Care/Admitting Diagnosis ED Disposition     ED Disposition  Admit   Condition  --   Comment  Hospital Area: [facility] [[id number]]  Level of Care: Telemetry [5]  Admit to tele based on following criteria: Monitor for Ischemic changes  May admit patient to or [facility] if equivalent level of care is available:: No  Covid Evaluation: Confirmed COVID Negative  Diagnosis: Acute exacerbation of chronic obstructive pulmonary disease (COPD) [facility]) [phone]  Admitting Physician: [facility] [[id number]]  Attending Physician: Angie Fava [[id number]]  Estimated length of stay: past midnight tomorrow  Certification:: I certify this patient will need inpatient services for at least 2 midnights          B Medical/Surgery History Past Medical History:  Diagnosis Date   Acquired hypothyroidism    Chronic diastolic heart failure (HCC)    COPD (chronic obstructive pulmonary disease) (HCC)    Essential hypertension    Hyperlipidemia    Urinary incontinence    History reviewed. No pertinent surgical history.   A IV  Location/Drains/Wounds Patient Lines/Drains/Airways Status     Active Line/Drains/Airways     Name Placement date Placement time Site Days   Peripheral IV 02/27/21 18 G Anterior;Left;Proximal Forearm 02/27/21  1816  Forearm  1   External Urinary Catheter 02/28/21  0218  --  less than 1            Intake/Output Last 24 hours  Intake/Output Summary (Last 24 hours) at 02/28/2021 1359 Last data filed at 02/28/2021 03/02/2021 Gross per 24 hour  Intake 250 ml  Output --  Net 250 ml    Labs/Imaging Results for orders placed or performed during the hospital encounter of 02/27/21 (from the past 48 hour(s))  Brain natriuretic peptide     Status: None   Collection Time: 02/27/21  6:29 PM  Result Value Ref Range   B Natriuretic Peptide 72.7 0.0 - 100.0 pg/mL    Comment: Performed at [facility], [address] [place], [place]  Basic metabolic panel     Status: Abnormal   Collection Time: 02/27/21  6:29 PM  Result Value Ref Range   Sodium 135 135 - 145 mmol/L   Potassium 3.8 3.5 - 5.1 mmol/L   Chloride 96 (L) 98 - 111 mmol/L   CO2 32 22 - 32 mmol/L   Glucose, Bld 116 (H) 70 - 99 mg/dL    Comment: Glucose reference range applies only to samples taken after fasting for at least 8 hours.   BUN 21 8 - 23 mg/dL   Creatinine, Ser 37106 0.44 -  1.00 mg/dL   Calcium 9.0 8.9 - 16.1 mg/dL   GFR, Estimated >09 >60 mL/min    Comment: (NOTE) Calculated using the CKD-EPI Creatinine Equation (2021)    Anion gap 7 5 - 15    Comment: Performed at [facility], [address] [place], [place] [address]  Troponin I (High Sensitivity)     Status: None   Collection Time: 02/27/21  6:29 PM  Result Value Ref Range   Troponin I (High Sensitivity) 4 <18 ng/L    Comment: (NOTE) Elevated high sensitivity troponin I (hsTnI) values and significant  changes across serial measurements may suggest ACS but many other  chronic and acute conditions are known to  elevate hsTnI results.  Refer to the "Links" section for chest pain algorithms and additional  guidance. Performed at [facility], [address] [place], [place] [address]   CBC with Differential     Status: None   Collection Time: 02/27/21  6:29 PM  Result Value Ref Range   WBC 9.3 4.0 - 10.5 K/uL   RBC 3.89 3.87 - 5.11 MIL/uL   Hemoglobin 12.2 12.0 - 15.0 g/dL   HCT 47.8 29.5 - 62.1 %   MCV 96.9 80.0 - 100.0 fL   MCH 31.4 26.0 - 34.0 pg   MCHC 32.4 30.0 - 36.0 g/dL   RDW 30.8 65.7 - 84.6 %   Platelets 237 150 - 400 K/uL   nRBC 0.0 0.0 - 0.2 %   Neutrophils Relative % 70 %   Neutro Abs 6.5 1.7 - 7.7 K/uL   Lymphocytes Relative 17 %   Lymphs Abs 1.6 0.7 - 4.0 K/uL   Monocytes Relative 8 %   Monocytes Absolute 0.7 0.1 - 1.0 K/uL   Eosinophils Relative 4 %   Eosinophils Absolute 0.4 0.0 - 0.5 K/uL   Basophils Relative 1 %   Basophils Absolute 0.1 0.0 - 0.1 K/uL   Immature Granulocytes 0 %   Abs Immature Granulocytes 0.03 0.00 - 0.07 K/uL    Comment: Performed at [facility], [address] [place], [place] [address]  Resp Panel by RT-PCR (Flu A&B, Covid) Nasopharyngeal Swab     Status: None   Collection Time: 02/27/21  6:29 PM   Specimen: Nasopharyngeal Swab; Nasopharyngeal(NP) swabs in vial transport medium  Result Value Ref Range   SARS Coronavirus 2 by RT PCR NEGATIVE NEGATIVE    Comment: (NOTE) SARS-CoV-2 target nucleic acids are NOT DETECTED.  The SARS-CoV-2 RNA is generally detectable in upper respiratory specimens during the acute phase of infection. The lowest concentration of SARS-CoV-2 viral copies this assay can detect is 138 copies/mL. A negative result does not preclude SARS-Cov-2 infection and should not be used as the sole basis for treatment or other patient management decisions. A negative result may occur with  improper specimen collection/handling, submission of specimen other than nasopharyngeal swab, presence  of viral mutation(s) within the areas targeted by this assay, and inadequate number of viral copies(<138 copies/mL). A negative result must be combined with clinical observations, patient history, and epidemiological information. The expected result is Negative.  Fact Sheet for Patients:  BloggerCourse.com  Fact Sheet for Healthcare Providers:  SeriousBroker.it  This test is no t yet approved or cleared by the [place] FDA and  has been authorized for detection and/or diagnosis of SARS-CoV-2 by FDA under an Emergency Use Authorization (EUA). This EUA will remain  in effect (meaning this test can be used) for  the duration of the COVID-19 declaration under Section 564(b)(1) of the Act, 21 U.S.C.section 360bbb-3(b)(1), unless the authorization is terminated  or revoked sooner.       Influenza A by PCR NEGATIVE NEGATIVE   Influenza B by PCR NEGATIVE NEGATIVE    Comment: (NOTE) The Xpert Xpress SARS-CoV-2/FLU/RSV plus assay is intended as an aid in the diagnosis of influenza from Nasopharyngeal swab specimens and should not be used as a sole basis for treatment. Nasal washings and aspirates are unacceptable for Xpert Xpress SARS-CoV-2/FLU/RSV testing.  Fact Sheet for Patients: BloggerCourse.com  Fact Sheet for Healthcare Providers: SeriousBroker.it  This test is not yet approved or cleared by the [place] FDA and has been authorized for detection and/or diagnosis of SARS-CoV-2 by FDA under an Emergency Use Authorization (EUA). This EUA will remain in effect (meaning this test can be used) for the duration of the COVID-19 declaration under Section 564(b)(1) of the Act, 21 U.S.C. section 360bbb-3(b)(1), unless the authorization is terminated or revoked.  Performed at [facility], [address], [place], [place] [address]   Troponin I (High  Sensitivity)     Status: None   Collection Time: 02/27/21  8:55 PM  Result Value Ref Range   Troponin I (High Sensitivity) 4 <18 ng/L    Comment: (NOTE) Elevated high sensitivity troponin I (hsTnI) values and significant  changes across serial measurements may suggest ACS but many other  chronic and acute conditions are known to elevate hsTnI results.  Refer to the "Links" section for chest pain algorithms and additional  guidance. Performed at [facility], [address] [place], [place] [address]   Phosphorus     Status: None   Collection Time: 02/28/21  4:30 AM  Result Value Ref Range   Phosphorus 3.2 2.5 - 4.6 mg/dL    Comment: Performed at [facility], [address] [place], [place] [address]  Magnesium     Status: None   Collection Time: 02/28/21  4:30 AM  Result Value Ref Range   Magnesium 2.1 1.7 - 2.4 mg/dL    Comment: Performed at [facility], [address] [place], [place] [address]  Comprehensive metabolic panel     Status: Abnormal   Collection Time: 02/28/21  4:30 AM  Result Value Ref Range   Sodium 136 135 - 145 mmol/L   Potassium 4.2 3.5 - 5.1 mmol/L   Chloride 97 (L) 98 - 111 mmol/L   CO2 32 22 - 32 mmol/L   Glucose, Bld 146 (H) 70 - 99 mg/dL    Comment: Glucose reference range applies only to samples taken after fasting for at least 8 hours.   BUN 24 (H) 8 - 23 mg/dL   Creatinine, Ser 0.93 0.44 - 1.00 mg/dL   Calcium 8.9 8.9 - 26.7 mg/dL   Total Protein 6.7 6.5 - 8.1 g/dL   Albumin 3.6 3.5 - 5.0 g/dL   AST 19 15 - 41 U/L   ALT 16 0 - 44 U/L   Alkaline Phosphatase 59 38 - 126 U/L   Total Bilirubin 0.7 0.3 - 1.2 mg/dL   GFR, Estimated >12 >45 mL/min    Comment: (NOTE) Calculated using the CKD-EPI Creatinine Equation (2021)    Anion gap 7 5 - 15    Comment: Performed at [facility], [address] [place], [place] [address]  CBC with Differential/Platelet     Status:  Abnormal   Collection Time: 02/28/21  4:30 AM  Result Value Ref Range   WBC 7.8 4.0 - 10.5 K/uL   RBC 3.91 3.87 - 5.11 MIL/uL   Hemoglobin 12.1 12.0 - 15.0 g/dL   HCT 19.1 47.8 - 29.5 %   MCV 96.2 80.0 - 100.0 fL   MCH 30.9 26.0 - 34.0 pg   MCHC 32.2 30.0 - 36.0 g/dL   RDW 62.1 30.8 - 65.7 %   Platelets 190 150 - 400 K/uL   nRBC 0.0 0.0 - 0.2 %   Neutrophils Relative % 94 %   Neutro Abs 7.3 1.7 - 7.7 K/uL   Lymphocytes Relative 5 %   Lymphs Abs 0.4 (L) 0.7 - 4.0 K/uL   Monocytes Relative 1 %   Monocytes Absolute 0.1 0.1 - 1.0 K/uL   Eosinophils Relative 0 %   Eosinophils Absolute 0.0 0.0 - 0.5 K/uL   Basophils Relative 0 %   Basophils Absolute 0.0 0.0 - 0.1 K/uL   Immature Granulocytes 0 %   Abs Immature Granulocytes 0.02 0.00 - 0.07 K/uL    Comment: Performed at [facility] [address], [place], [place] [address]  Blood gas, venous     Status: Abnormal   Collection Time: 02/28/21  4:30 AM  Result Value Ref Range   pH, Ven 7.376 7.250 - 7.430   pCO2, Ven 56.0 44.0 - 60.0 mmHg   pO2, Ven 90.3 (H) 32.0 - 45.0 mmHg   Bicarbonate 32.1 (H) 20.0 - 28.0 mmol/L   Acid-Base Excess 5.9 (H) 0.0 - 2.0 mmol/L   O2 Saturation 96.0 %   Patient temperature 98.6     Comment: Performed at [facility], [address], [place], [place] [address]  Procalcitonin - Baseline     Status: None   Collection Time: 02/28/21  4:30 AM  Result Value Ref Range   Procalcitonin <0.10 ng/mL    Comment:        Interpretation: PCT (Procalcitonin) <= 0.5 ng/mL: Systemic infection (sepsis) is not likely. Local bacterial infection is possible. (NOTE)       Sepsis PCT Algorithm           Lower Respiratory Tract                                      Infection PCT Algorithm    ----------------------------     ----------------------------         PCT < 0.25 ng/mL                PCT < 0.10 ng/mL          Strongly encourage             Strongly discourage    discontinuation of antibiotics    initiation of antibiotics    ----------------------------     -----------------------------       PCT 0.25 - 0.50 ng/mL            PCT 0.10 - 0.25 ng/mL               OR       >80% decrease in PCT            Discourage initiation of  antibiotics      Encourage discontinuation           of antibiotics    ----------------------------     -----------------------------         PCT >= 0.50 ng/mL              PCT 0.26 - 0.50 ng/mL               AND        <80% decrease in PCT             Encourage initiation of                                             antibiotics       Encourage continuation           of antibiotics    ----------------------------     -----------------------------        PCT >= 0.50 ng/mL                  PCT > 0.50 ng/mL               AND         increase in PCT                  Strongly encourage                                      initiation of antibiotics    Strongly encourage escalation           of antibiotics                                     -----------------------------                                           PCT <= 0.25 ng/mL                                                 OR                                        > 80% decrease in PCT                                      Discontinue / Do not initiate                                             antibiotics  Performed at [facility], [address] [place], [place] [address]    DG Chest Port 1 View  Result Date: 02/27/2021 CLINICAL DATA:  Shortness of breath and wheezing EXAM: PORTABLE CHEST 1 VIEW COMPARISON:  None. FINDINGS: Cardiac shadow is within normal limits. Aortic calcifications are noted. Lungs are hyper aerated bilaterally. No focal infiltrate or sizable effusion is seen. Mild apical scarring is noted bilaterally. IMPRESSION: No acute abnormality noted. Changes of COPD without acute abnormality.  Electronically Signed   By: Alcide Clever M.D.   On: 02/27/2021 19:18    Pending Labs Unresulted Labs (From admission, onward)    None       Vitals/Pain Today's Vitals   02/28/21 1130 02/28/21 1201 02/28/21 1300 02/28/21 1359  BP: 138/75 (!) 156/76 (!) 149/75 138/73  Pulse: 73 75 78 82  Resp: (!) 22 (!) 23 (!) 22 19  Temp:  98.2 F (36.8 C)  98.5 F (36.9 C)  TempSrc:  Oral  Oral  SpO2: 97% 97% 96% 95%  Weight:      Height:      PainSc:  0-No pain  0-No pain    Isolation Precautions No active isolations  Medications Medications  acetaminophen (TYLENOL) tablet 650 mg (has no administration in time range)    Or  acetaminophen (TYLENOL) suppository 650 mg (has no administration in time range)  albuterol (PROVENTIL) (2.5 MG/3ML) 0.083% nebulizer solution 2.5 mg (has no administration in time range)  methylPREDNISolone sodium succinate (SOLU-MEDROL) 125 mg/2 mL injection 80 mg (80 mg Intravenous Given 02/28/21 1158)  enalapril (VASOTEC) tablet 20 mg (20 mg Oral Given 02/28/21 0933)  aspirin chewable tablet 81 mg (81 mg Oral Patient Refused/Not Given 02/28/21 0103)  ipratropium-albuterol (DUONEB) 0.5-2.5 (3) MG/3ML nebulizer solution 3 mL (3 mLs Nebulization Given 02/28/21 1332)  amLODipine (NORVASC) tablet 5 mg (5 mg Oral Given 02/28/21 0932)  levothyroxine (SYNTHROID) tablet 125 mcg (125 mcg Oral Given 02/28/21 0635)  metoprolol succinate (TOPROL-XL) 24 hr tablet 50 mg (50 mg Oral Given 02/28/21 0937)  polyethylene glycol (MIRALAX / GLYCOLAX) packet 17 g (17 g Oral Not Given 02/28/21 0937)  senna (SENOKOT) tablet 8.6 mg (8.6 mg Oral Given 02/28/21 0936)  azithromycin (ZITHROMAX) tablet 500 mg (has no administration in time range)  albuterol (PROVENTIL) (2.5 MG/3ML) 0.083% nebulizer solution 2.5 mg (2.5 mg Nebulization Given 02/27/21 2054)    Mobility walks Low fall risk       R Recommendations: See Admitting Provider Note  Report given to:   Additional Notes:

## 2021-02-28 NOTE — Progress Notes (Signed)
PROGRESS NOTE    Valerie Bowman  OMV:[id number] DOB: [birth year] DOA: 02/27/2021 PCP: Herschel Senegal, MD    Brief Narrative:  This 85 years old female with PMH significant for COPD, essential hypertension, chronic diastolic heart failure, hyperlipidemia presented in the ED with complaints of severe shortness of breath requiring oxygen. Patient is admitted for COPD exacerbation.  Assessment & Plan:   Principal Problem:   Acute exacerbation of chronic obstructive pulmonary disease (COPD) (HCC) Active Problems:   SOB (shortness of breath)   Acquired hypothyroidism   Essential hypertension   Chronic diastolic heart failure (HCC)   Acute hypoxic respiratory failure secondary to acute exacerbation of COPD: Patient presented with 2 days of progressive shortness of breath with associated cough. Chest x-ray showed no evidence of acute cardiopulmonary process.   Continue supplemental oxygen to keep saturation above 94%. Patient uses oxygen only at night at baseline. Continue DuoNeb scheduled and as needed. Continue IV Solu-Medrol 40 mg every 8 hours. Continue IV Zithromax for COPD.  Essential hypertension: Continue amlodipine, Vasotec, metoprolol.  Chronic diastolic heart failure: Monitor daily weight, intake output charting. Presentation does not seem CHF exacerbation.  Hypothyroidism: Continue levothyroxine  DVT prophylaxis:  SCDs Code Status:DNR Family Communication: Daughter at bed side. Disposition Plan:   Status is: Inpatient  Remains inpatient appropriate because:  Admitted for COPD exacerbation requiring IV solumedrol, supplemental oxygen and antibiotics.  Consultants:   None  Procedures: None  Antimicrobials:   Anti-infectives (From admission, onward)    Start     Dose/Rate Route Frequency Ordered Stop   02/28/21 2200  azithromycin (ZITHROMAX) tablet 500 mg        500 mg Oral Daily 02/28/21 0938 03/04/21 2159   02/27/21 2230  azithromycin (ZITHROMAX) 500 mg in  sodium chloride 0.9 % 250 mL IVPB  Status:  Discontinued        500 mg 250 mL/hr over 60 Minutes Intravenous Every 24 hours 02/27/21 2227 02/28/21 6283       Subjective: Patient was seen and examined at bedside.  Overnight events noted.   Patient reports feeling very much improved but feels generally weak.  Objective: Vitals:   02/28/21 1100 02/28/21 1130 02/28/21 1201 02/28/21 1300  BP: (!) 147/71 138/75 (!) 156/76 (!) 149/75  Pulse: 75 73 75 78  Resp: 20 (!) 22 (!) 23 (!) 22  Temp:   98.2 F (36.8 C)   TempSrc:   Oral   SpO2: 97% 97% 97% 96%  Weight:      Height:        Intake/Output Summary (Last 24 hours) at 02/28/2021 1359 Last data filed at 02/28/2021 6629 Gross per 24 hour  Intake 250 ml  Output --  Net 250 ml   Filed Weights   02/27/21 1940 02/28/21 0700  Weight: 85.3 kg 85.3 kg    Examination:  General exam: Appears comfortable, not in any acute distress. Respiratory system: Clear to auscultation bilaterally, respiratory effort normal, RR 15 Cardiovascular system: S1-S2 heard, regular rate and rhythm, no murmur. Gastrointestinal system: Abdomen is soft, nontender, nondistended, BS+ Central nervous system: Alert and oriented x 3. No focal neurological deficits. Extremities: No edema, no cyanosis, no clubbing. Skin: No rashes, lesions or ulcers Psychiatry: Judgement and insight appear normal. Mood & affect appropriate.     Data Reviewed: I have personally reviewed following labs and imaging studies  CBC: Recent Labs  Lab 02/27/21 1829 02/28/21 0430  WBC 9.3 7.8  NEUTROABS 6.5 7.3  HGB 12.2 12.1  HCT  37.7 37.6  MCV 96.9 96.2  PLT 237 190   Basic Metabolic Panel: Recent Labs  Lab 02/27/21 1829 02/28/21 0430  NA 135 136  K 3.8 4.2  CL 96* 97*  CO2 32 32  GLUCOSE 116* 146*  BUN 21 24*  CREATININE 0.64 0.68  CALCIUM 9.0 8.9  MG  --  2.1  PHOS  --  3.2   GFR: Estimated Creatinine Clearance: 59.8 mL/min (by C-G formula based on SCr of  0.68 mg/dL). Liver Function Tests: Recent Labs  Lab 02/28/21 0430  AST 19  ALT 16  ALKPHOS 59  BILITOT 0.7  PROT 6.7  ALBUMIN 3.6   No results for input(s): LIPASE, AMYLASE in the last 168 hours. No results for input(s): AMMONIA in the last 168 hours. Coagulation Profile: No results for input(s): INR, PROTIME in the last 168 hours. Cardiac Enzymes: No results for input(s): CKTOTAL, CKMB, CKMBINDEX, TROPONINI in the last 168 hours. BNP (last 3 results) No results for input(s): PROBNP in the last 8760 hours. HbA1C: No results for input(s): HGBA1C in the last 72 hours. CBG: No results for input(s): GLUCAP in the last 168 hours. Lipid Profile: No results for input(s): CHOL, HDL, LDLCALC, TRIG, CHOLHDL, LDLDIRECT in the last 72 hours. Thyroid Function Tests: No results for input(s): TSH, T4TOTAL, FREET4, T3FREE, THYROIDAB in the last 72 hours. Anemia Panel: No results for input(s): VITAMINB12, FOLATE, FERRITIN, TIBC, IRON, RETICCTPCT in the last 72 hours. Sepsis Labs: Recent Labs  Lab 02/28/21 0430  PROCALCITON <0.10    Recent Results (from the past 240 hour(s))  Resp Panel by RT-PCR (Flu A&B, Covid) Nasopharyngeal Swab     Status: None   Collection Time: 02/27/21  6:29 PM   Specimen: Nasopharyngeal Swab; Nasopharyngeal(NP) swabs in vial transport medium  Result Value Ref Range Status   SARS Coronavirus 2 by RT PCR NEGATIVE NEGATIVE Final    Comment: (NOTE) SARS-CoV-2 target nucleic acids are NOT DETECTED.  The SARS-CoV-2 RNA is generally detectable in upper respiratory specimens during the acute phase of infection. The lowest concentration of SARS-CoV-2 viral copies this assay can detect is 138 copies/mL. A negative result does not preclude SARS-Cov-2 infection and should not be used as the sole basis for treatment or other patient management decisions. A negative result may occur with  improper specimen collection/handling, submission of specimen other than  nasopharyngeal swab, presence of viral mutation(s) within the areas targeted by this assay, and inadequate number of viral copies(<138 copies/mL). A negative result must be combined with clinical observations, patient history, and epidemiological information. The expected result is Negative.  Fact Sheet for Patients:  BloggerCourse.com  Fact Sheet for Healthcare Providers:  SeriousBroker.it  This test is no t yet approved or cleared by the [place] FDA and  has been authorized for detection and/or diagnosis of SARS-CoV-2 by FDA under an Emergency Use Authorization (EUA). This EUA will remain  in effect (meaning this test can be used) for the duration of the COVID-19 declaration under Section 564(b)(1) of the Act, 21 U.S.C.section 360bbb-3(b)(1), unless the authorization is terminated  or revoked sooner.       Influenza A by PCR NEGATIVE NEGATIVE Final   Influenza B by PCR NEGATIVE NEGATIVE Final    Comment: (NOTE) The Xpert Xpress SARS-CoV-2/FLU/RSV plus assay is intended as an aid in the diagnosis of influenza from Nasopharyngeal swab specimens and should not be used as a sole basis for treatment. Nasal washings and aspirates are unacceptable for Xpert Xpress SARS-CoV-2/FLU/RSV  testing.  Fact Sheet for Patients: BloggerCourse.com  Fact Sheet for Healthcare Providers: SeriousBroker.it  This test is not yet approved or cleared by the [place] FDA and has been authorized for detection and/or diagnosis of SARS-CoV-2 by FDA under an Emergency Use Authorization (EUA). This EUA will remain in effect (meaning this test can be used) for the duration of the COVID-19 declaration under Section 564(b)(1) of the Act, 21 U.S.C. section 360bbb-3(b)(1), unless the authorization is terminated or revoked.  Performed at [facility], [address], [place], [place] [address]     Radiology Studies: [facility] Chest Port 1 View  Result Date: 02/27/2021 CLINICAL DATA:  Shortness of breath and wheezing EXAM: PORTABLE CHEST 1 VIEW COMPARISON:  None. FINDINGS: Cardiac shadow is within normal limits. Aortic calcifications are noted. Lungs are hyper aerated bilaterally. No focal infiltrate or sizable effusion is seen. Mild apical scarring is noted bilaterally. IMPRESSION: No acute abnormality noted. Changes of COPD without acute abnormality. Electronically Signed   By: Alcide Clever M.D.   On: 02/27/2021 19:18    Scheduled Meds:  amLODipine  5 mg Oral Daily   aspirin  81 mg Oral Once   azithromycin  500 mg Oral Daily   enalapril  20 mg Oral BID   ipratropium-albuterol  3 mL Nebulization TID   levothyroxine  125 mcg Oral Q0600   methylPREDNISolone (SOLU-MEDROL) injection  80 mg Intravenous Q6H   metoprolol succinate  50 mg Oral Daily   polyethylene glycol  17 g Oral Daily   senna  1 tablet Oral BID   Continuous Infusions:   LOS: 1 day    Time spent: 35 mins    Sayla Golonka, MD [facility]   If 7PM-7AM, please contact night-coverage

## 2021-03-01 ENCOUNTER — Telehealth: Payer: Self-pay | Admitting: Orthopaedic Surgery

## 2021-03-01 LAB — SARS CORONAVIRUS 2 BY RT PCR (HOSPITAL ORDER, PERFORMED IN ~~LOC~~ HOSPITAL LAB): SARS Coronavirus 2: NEGATIVE

## 2021-03-01 MED ORDER — ALBUTEROL SULFATE HFA 108 (90 BASE) MCG/ACT IN AERS: 1.0000 | INHALATION_SPRAY | Freq: Four times a day (QID) | RESPIRATORY_TRACT | 2 refills | Status: DC | PRN

## 2021-03-01 MED ORDER — AZITHROMYCIN 250 MG PO TABS: ORAL_TABLET | ORAL | 0 refills | Status: AC

## 2021-03-01 MED ORDER — AZITHROMYCIN 250 MG PO TABS: ORAL_TABLET | ORAL | 0 refills | Status: DC

## 2021-03-01 MED ORDER — PREDNISONE 20 MG PO TABS: 40.0000 mg | ORAL_TABLET | Freq: Every day | ORAL | 0 refills | Status: DC

## 2021-03-01 MED ORDER — PREDNISONE 20 MG PO TABS: 40.0000 mg | ORAL_TABLET | Freq: Every day | ORAL | 0 refills | Status: AC

## 2021-03-01 NOTE — Evaluation (Signed)
Physical Therapy Evaluation Patient Details Name: Valerie Bowman MRN: [id number] DOB: October 23, 1935 Today's Date: 03/01/2021  History of Present Illness  Pt is a 85 y.o. female presenting to ED with acute COPD exacerbation associated with acute hypoxic respiratory distress.  PMH significant for COPD, essential hypertension, chronic diastolic heart failure, hyperlipidemia.  Clinical Impression  Pt is an 85 y.o. female with above HPI resulting in the deficits listed below (see PT Problem List). Pt performed sit to stand transfers with MIN guard for safety and cues for safe hand placement. Pt ambulated total of ~118ft with MIN guard progressing to supervision towards end for safety. O2 reading 89-92% on RA during ambulation, with O2 sat 96% with seated rest following. Pt reports living at ALF at baseline and receives assist with ADLs.  Pt will benefit from skilled PT to maximize functional mobility to increase independence during stay, but anticipate no follow up PT needs upon d/c.         Recommendations for follow up therapy are one component of a multi-disciplinary discharge planning process, led by the attending physician.  Recommendations may be updated based on patient status, additional functional criteria and insurance authorization.  Follow Up Recommendations No PT follow up    Assistance Recommended at Discharge Frequent or constant Supervision/Assistance  Functional Status Assessment Patient has had a recent decline in their functional status and demonstrates the ability to make significant improvements in function in a reasonable and predictable amount of time.  Equipment Recommendations  None recommended by PT (pt owns RW)    Recommendations for Other Services       Precautions / Restrictions Precautions Precautions: Fall Restrictions Weight Bearing Restrictions: No      Mobility  Bed Mobility Overal bed mobility: Needs Assistance Bed Mobility: Supine to Sit;Sit to Supine      Supine to sit: Supervision;HOB elevated Sit to supine: Supervision;HOB elevated   General bed mobility comments: supervision for safety    Transfers Overall transfer level: Needs assistance Equipment used: Rolling walker (2 wheels) Transfers: Sit to/from Stand Sit to Stand: Min guard;Supervision           General transfer comment: MIN guard for safety, pt with use of B UEs to power up from EOB and grab bars for rise from toilet.    Ambulation/Gait Ambulation/Gait assistance: Min guard;Supervision Gait Distance (Feet): 140 Feet Assistive device: Rolling walker (2 wheels) Gait Pattern/deviations: Step-through pattern;Decreased stride length Gait velocity: decr     General Gait Details: No overt LOB observed, stable with use of RW. Discussed good stability and promotion of independence with use of RW vs. cane at this time. Pt reports she used to use cane at times but has moved ot using RW at baseline.  Stairs            Wheelchair Mobility    Modified Rankin (Stroke Patients Only)       Balance Overall balance assessment: Needs assistance Sitting-balance support: Feet supported Sitting balance-Leahy Scale: Good     Standing balance support: Single extremity supported;Bilateral upper extremity supported Standing balance-Leahy Scale: Fair Standing balance comment: able to stand and perform pericare with at least single UE support required                             Pertinent Vitals/Pain Pain Assessment: No/denies pain    Home Living Family/patient expects to be discharged to:: Assisted living  Home Equipment: Agricultural consultant (2 wheels);Cane - single point;Shower seat - built in;Grab bars - toilet;Grab bars - tub/shower Additional Comments: [facility]    Prior Function Prior Level of Function : Needs assist               ADLs Comments: assist with dressing and bathing     Hand Dominance   Dominant Hand:  Right    Extremity/Trunk Assessment   Upper Extremity Assessment Upper Extremity Assessment: Overall WFL for tasks assessed    Lower Extremity Assessment Lower Extremity Assessment: Generalized weakness    Cervical / Trunk Assessment Cervical / Trunk Assessment: Normal  Communication   Communication: No difficulties  Cognition Arousal/Alertness: Awake/alert Behavior During Therapy: WFL for tasks assessed/performed Overall Cognitive Status: Within Functional Limits for tasks assessed                                          General Comments      Exercises     Assessment/Plan    PT Assessment Patient needs continued PT services  PT Problem List Decreased strength;Decreased activity tolerance;Decreased balance;Decreased mobility       PT Treatment Interventions DME instruction;Gait training;Functional mobility training;Therapeutic activities;Therapeutic exercise;Balance training;Patient/family education    PT Goals (Current goals can be found in the Care Plan section)  Acute Rehab PT Goals Patient Stated Goal: Go home and be up and moving more PT Goal Formulation: With patient Time For Goal Achievement: 03/15/21 Potential to Achieve Goals: Good    Frequency Min 3X/week   Barriers to discharge        Co-evaluation               AM-PAC PT "6 Clicks" Mobility  Outcome Measure Help needed turning from your back to your side while in a flat bed without using bedrails?: None Help needed moving from lying on your back to sitting on the side of a flat bed without using bedrails?: A Little Help needed moving to and from a bed to a chair (including a wheelchair)?: A Little Help needed standing up from a chair using your arms (e.g., wheelchair or bedside chair)?: A Little Help needed to walk in hospital room?: A Little Help needed climbing 3-5 steps with a railing? : A Little 6 Click Score: 19    End of Session Equipment Utilized During  Treatment: Gait belt Activity Tolerance: Patient tolerated treatment well Patient left: in bed;with call bell/phone within reach;with bed alarm set Nurse Communication: Mobility status PT Visit Diagnosis: Unsteadiness on feet (R26.81)    Time: 9518-8416 PT Time Calculation (min) (ACUTE ONLY): 29 min   Charges:   PT Evaluation $PT Eval Low Complexity: 1 Low PT Treatments $Therapeutic Activity: 8-22 mins        Lyman Speller PT, DPT  Acute Rehabilitation Services  Office [phone]  03/01/2021, 12:18 PM

## 2021-03-01 NOTE — Discharge Summary (Signed)
Physician Discharge Summary  Valerie Bowman QZR:[id number] DOB: [birth year] DOA: 02/27/2021  PCP: Herschel Senegal, MD  Admit date: 02/27/2021  Discharge date: 03/01/2021  Admitted From: Home  Disposition:  Home  Recommendations for Outpatient Follow-up:  Follow up with PCP in 1-2 weeks. Please obtain BMP/CBC in one week. Advised to take zithromax for three more days. Advised prednisone 40 mg daily for three more days.  Home Health: None Equipment/Devices: Home oxygen  Discharge Condition: Stable CODE STATUS: DNR Diet recommendation: Heart Healthy   Brief Summary / Hospital Course: This 85 years old female with PMH significant for COPD, essential hypertension, chronic diastolic heart failure, hyperlipidemia presented in the ED with complaints of severe shortness of breath requiring continuous oxygen.  Patient uses oxygen only at night, reports having common cold causing precipitation of her COPD.  She has been using inhalers without any significant improvement. Patient was admitted for COPD exacerbation.  Patient was started on IV Solu-Medrol and continued on scheduled and as needed nebulizations.  Patient has significantly improved with continuous oxygen and COPD management.  She was also given Zithromax for possible bronchitis.  Patient weaned down to room air.  She has ambulated with therapist without any needs.  Patient needs to continue oxygen at home.  Patient feels better  and wants  to be discharged.  Patient is being discharged home on prednisone and Zithromax for 3 days.  She was managed for below problems.   Discharge Diagnoses:  Principal Problem:   Acute exacerbation of chronic obstructive pulmonary disease (COPD) (HCC) Active Problems:   SOB (shortness of breath)   Acquired hypothyroidism   Essential hypertension   Chronic diastolic heart failure (HCC)  Acute hypoxic respiratory failure secondary to acute exacerbation of COPD: Patient presented with 2 days of  progressive shortness of breath with associated cough. Chest x-ray showed no evidence of acute cardiopulmonary process.   Continue supplemental oxygen to keep saturation above 94%. Patient uses oxygen only at night at baseline. Continue DuoNeb scheduled and as needed. Continue IV Solu-Medrol 40 mg every 8 hours. Continue IV Zithromax for COPD. Patient feels much better,  wheezing has resolved.  She wants to be discharged. Patient being discharged home on Zithromax and prednisone for 3 days.   Essential hypertension: Continue amlodipine, Vasotec, metoprolol.   Chronic diastolic heart failure: Monitor daily weight, intake output charting. Presentation does not seem CHF exacerbation.   Hypothyroidism: Continue levothyroxine  Discharge Instructions  Discharge Instructions     Call MD for:  difficulty breathing, headache or visual disturbances   Complete by: As directed    Call MD for:  persistant dizziness or light-headedness   Complete by: As directed    Call MD for:  persistant nausea and vomiting   Complete by: As directed    Diet - low sodium heart healthy   Complete by: As directed    Diet Carb Modified   Complete by: As directed    Discharge instructions   Complete by: As directed    Advised to follow up PCP in one week. Advised to take zithromax for three more days. Advised prednisone 40 mg daily for three more days.   Increase activity slowly   Complete by: As directed       Allergies as of 03/01/2021       Reactions   Penicillins    Other reaction(s): Other (see comments) Childhood   Sulfa Antibiotics         Medication List     STOP taking  these medications    ibuprofen 200 MG tablet Commonly known as: ADVIL   magnesium hydroxide 400 MG/5ML suspension Commonly known as: MILK OF MAGNESIA       TAKE these medications    acetaminophen 500 MG tablet Commonly known as: TYLENOL Take 500 mg by mouth every 6 (six) hours as needed for mild pain,  fever or headache.   albuterol 108 (90 Base) MCG/ACT inhaler Commonly known as: VENTOLIN HFA Inhale 1 puff into the lungs every 6 (six) hours as needed for wheezing or shortness of breath.   amLODipine 5 MG tablet Commonly known as: NORVASC Take 5 mg by mouth daily.   aspirin EC 81 MG tablet Take 81 mg by mouth daily. Swallow whole.   azithromycin 250 MG tablet Commonly known as: Zithromax Z-Pak Take1 tablet (250 mg) once daily on Days 2 through 5.   enalapril 20 MG tablet Commonly known as: VASOTEC Take 20 mg by mouth 2 (two) times daily.   levothyroxine 125 MCG tablet Commonly known as: SYNTHROID Take 125 mcg by mouth daily at 6 (six) AM.   metoprolol succinate 50 MG 24 hr tablet Commonly known as: TOPROL-XL Take 50 mg by mouth daily. Take with or immediately following a meal.   multivitamin with minerals Tabs tablet Take 1 tablet by mouth daily.   polyethylene glycol 17 g packet Commonly known as: MIRALAX / GLYCOLAX Take 17 g by mouth daily.   polyvinyl alcohol 1.4 % ophthalmic solution Commonly known as: LIQUIFILM TEARS Place 1 drop into both eyes 2 (two) times daily.   predniSONE 20 MG tablet Commonly known as: DELTASONE Take 2 tablets (40 mg total) by mouth daily for 3 days.   senna 8.6 MG Tabs tablet Commonly known as: SENOKOT Take 1 tablet by mouth 2 (two) times daily.   Tiotropium Bromide-Olodaterol 2.5-2.5 MCG/ACT Aers Inhale 2 puffs into the lungs daily.        Follow-up Information     Herschel Senegal, MD Follow up in 1 week(s).   Specialties: Internal Medicine, Geriatric Medicine Contact information: [address] [place] [address] [phone]                Allergies  Allergen Reactions   Penicillins     Other reaction(s): Other (see comments) Childhood    Sulfa Antibiotics     Consultations: None   Procedures/Studies: DG Chest Port 1 View  Result Date: 02/27/2021 CLINICAL DATA:  Shortness of breath and wheezing  EXAM: PORTABLE CHEST 1 VIEW COMPARISON:  None. FINDINGS: Cardiac shadow is within normal limits. Aortic calcifications are noted. Lungs are hyper aerated bilaterally. No focal infiltrate or sizable effusion is seen. Mild apical scarring is noted bilaterally. IMPRESSION: No acute abnormality noted. Changes of COPD without acute abnormality. Electronically Signed   By: Alcide Clever M.D.   On: 02/27/2021 19:18   XR Lumbar Spine 2-3 Views  Result Date: 02/20/2021 AP and lateral lumbar spine demonstrates a very minimal right-sided degenerative scoliosis.  There is diffuse degenerative disc disease throughout the lumbar spine with anterior and posterior osteophytes and subchondral sclerosis.  All consistent with advanced degenerative arthritis.  No evidence of compression fracture.  Diffuse calcification of the abdominal aorta without aneurysmal dilatation.  XR Pelvis 1-2 Views  Result Date: 02/20/2021 AP pelvis demonstrates mild degenerative changes of the right and left hip.  No ectopic calcification or acute changes.  Very minimal decrease in the joint space on the right.  There is some bony protuberance of the  acetabulum     Subjective: Patient was seen and examined at bedside.  Overnight events noted.   Patient reports feeling much better.  Wheezing has resolved.  She wants to be discharged.  Discharge Exam: Vitals:   03/01/21 0407 03/01/21 0812  BP: 137/70   Pulse: 69   Resp: 19   Temp: 97.6 F (36.4 C)   SpO2: 95% 91%   Vitals:   02/28/21 2327 03/01/21 0407 03/01/21 0500 03/01/21 0812  BP: (!) 148/74 137/70    Pulse: 74 69    Resp: 16 19    Temp: 97.8 F (36.6 C) 97.6 F (36.4 C)    TempSrc:      SpO2: 95% 95%  91%  Weight:   86.5 kg   Height:        General: Pt is alert, awake, not in acute distress Cardiovascular: RRR, S1/S2 +, no rubs, no gallops Respiratory: CTA bilaterally, no wheezing, no rhonchi Abdominal: Soft, NT, ND, bowel sounds + Extremities: no edema, no  cyanosis    The results of significant diagnostics from this hospitalization (including imaging, microbiology, ancillary and laboratory) are listed below for reference.     Microbiology: Recent Results (from the past 240 hour(s))  Resp Panel by RT-PCR (Flu A&B, Covid) Nasopharyngeal Swab     Status: None   Collection Time: 02/27/21  6:29 PM   Specimen: Nasopharyngeal Swab; Nasopharyngeal(NP) swabs in vial transport medium  Result Value Ref Range Status   SARS Coronavirus 2 by RT PCR NEGATIVE NEGATIVE Final    Comment: (NOTE) SARS-CoV-2 target nucleic acids are NOT DETECTED.  The SARS-CoV-2 RNA is generally detectable in upper respiratory specimens during the acute phase of infection. The lowest concentration of SARS-CoV-2 viral copies this assay can detect is 138 copies/mL. A negative result does not preclude SARS-Cov-2 infection and should not be used as the sole basis for treatment or other patient management decisions. A negative result may occur with  improper specimen collection/handling, submission of specimen other than nasopharyngeal swab, presence of viral mutation(s) within the areas targeted by this assay, and inadequate number of viral copies(<138 copies/mL). A negative result must be combined with clinical observations, patient history, and epidemiological information. The expected result is Negative.  Fact Sheet for Patients:  BloggerCourse.com  Fact Sheet for Healthcare Providers:  SeriousBroker.it  This test is no t yet approved or cleared by the [place] FDA and  has been authorized for detection and/or diagnosis of SARS-CoV-2 by FDA under an Emergency Use Authorization (EUA). This EUA will remain  in effect (meaning this test can be used) for the duration of the COVID-19 declaration under Section 564(b)(1) of the Act, 21 U.S.C.section 360bbb-3(b)(1), unless the authorization is terminated  or revoked  sooner.       Influenza A by PCR NEGATIVE NEGATIVE Final   Influenza B by PCR NEGATIVE NEGATIVE Final    Comment: (NOTE) The Xpert Xpress SARS-CoV-2/FLU/RSV plus assay is intended as an aid in the diagnosis of influenza from Nasopharyngeal swab specimens and should not be used as a sole basis for treatment. Nasal washings and aspirates are unacceptable for Xpert Xpress SARS-CoV-2/FLU/RSV testing.  Fact Sheet for Patients: BloggerCourse.com  Fact Sheet for Healthcare Providers: SeriousBroker.it  This test is not yet approved or cleared by the [place] FDA and has been authorized for detection and/or diagnosis of SARS-CoV-2 by FDA under an Emergency Use Authorization (EUA). This EUA will remain in effect (meaning this test can be used) for the duration  of the COVID-19 declaration under Section 564(b)(1) of the Act, 21 U.S.C. section 360bbb-3(b)(1), unless the authorization is terminated or revoked.  Performed at [facility], [address], [place], [place] [address]      Labs: BNP (last 3 results) Recent Labs    02/27/21 1829  BNP 72.7   Basic Metabolic Panel: Recent Labs  Lab 02/27/21 1829 02/28/21 0430  NA 135 136  K 3.8 4.2  CL 96* 97*  CO2 32 32  GLUCOSE 116* 146*  BUN 21 24*  CREATININE 0.64 0.68  CALCIUM 9.0 8.9  MG  --  2.1  PHOS  --  3.2   Liver Function Tests: Recent Labs  Lab 02/28/21 0430  AST 19  ALT 16  ALKPHOS 59  BILITOT 0.7  PROT 6.7  ALBUMIN 3.6   No results for input(s): LIPASE, AMYLASE in the last 168 hours. No results for input(s): AMMONIA in the last 168 hours. CBC: Recent Labs  Lab 02/27/21 1829 02/28/21 0430  WBC 9.3 7.8  NEUTROABS 6.5 7.3  HGB 12.2 12.1  HCT 37.7 37.6  MCV 96.9 96.2  PLT 237 190   Cardiac Enzymes: No results for input(s): CKTOTAL, CKMB, CKMBINDEX, TROPONINI in the last 168 hours. BNP: Invalid input(s): POCBNP CBG: No  results for input(s): GLUCAP in the last 168 hours. D-Dimer No results for input(s): DDIMER in the last 72 hours. Hgb A1c No results for input(s): HGBA1C in the last 72 hours. Lipid Profile No results for input(s): CHOL, HDL, LDLCALC, TRIG, CHOLHDL, LDLDIRECT in the last 72 hours. Thyroid function studies No results for input(s): TSH, T4TOTAL, T3FREE, THYROIDAB in the last 72 hours.  Invalid input(s): FREET3 Anemia work up No results for input(s): VITAMINB12, FOLATE, FERRITIN, TIBC, IRON, RETICCTPCT in the last 72 hours. Urinalysis No results found for: COLORURINE, APPEARANCEUR, LABSPEC, PHURINE, GLUCOSEU, HGBUR, BILIRUBINUR, KETONESUR, PROTEINUR, UROBILINOGEN, NITRITE, LEUKOCYTESUR Sepsis Labs Invalid input(s): PROCALCITONIN,  WBC,  LACTICIDVEN Microbiology Recent Results (from the past 240 hour(s))  Resp Panel by RT-PCR (Flu A&B, Covid) Nasopharyngeal Swab     Status: None   Collection Time: 02/27/21  6:29 PM   Specimen: Nasopharyngeal Swab; Nasopharyngeal(NP) swabs in vial transport medium  Result Value Ref Range Status   SARS Coronavirus 2 by RT PCR NEGATIVE NEGATIVE Final    Comment: (NOTE) SARS-CoV-2 target nucleic acids are NOT DETECTED.  The SARS-CoV-2 RNA is generally detectable in upper respiratory specimens during the acute phase of infection. The lowest concentration of SARS-CoV-2 viral copies this assay can detect is 138 copies/mL. A negative result does not preclude SARS-Cov-2 infection and should not be used as the sole basis for treatment or other patient management decisions. A negative result may occur with  improper specimen collection/handling, submission of specimen other than nasopharyngeal swab, presence of viral mutation(s) within the areas targeted by this assay, and inadequate number of viral copies(<138 copies/mL). A negative result must be combined with clinical observations, patient history, and epidemiological information. The expected result is  Negative.  Fact Sheet for Patients:  BloggerCourse.com  Fact Sheet for Healthcare Providers:  SeriousBroker.it  This test is no t yet approved or cleared by the [place] FDA and  has been authorized for detection and/or diagnosis of SARS-CoV-2 by FDA under an Emergency Use Authorization (EUA). This EUA will remain  in effect (meaning this test can be used) for the duration of the COVID-19 declaration under Section 564(b)(1) of the Act, 21 U.S.C.section 360bbb-3(b)(1), unless the authorization is terminated  or revoked sooner.       Influenza A by PCR NEGATIVE NEGATIVE Final   Influenza B by PCR NEGATIVE NEGATIVE Final    Comment: (NOTE) The Xpert Xpress SARS-CoV-2/FLU/RSV plus assay is intended as an aid in the diagnosis of influenza from Nasopharyngeal swab specimens and should not be used as a sole basis for treatment. Nasal washings and aspirates are unacceptable for Xpert Xpress SARS-CoV-2/FLU/RSV testing.  Fact Sheet for Patients: BloggerCourse.com  Fact Sheet for Healthcare Providers: SeriousBroker.it  This test is not yet approved or cleared by the [place] FDA and has been authorized for detection and/or diagnosis of SARS-CoV-2 by FDA under an Emergency Use Authorization (EUA). This EUA will remain in effect (meaning this test can be used) for the duration of the COVID-19 declaration under Section 564(b)(1) of the Act, 21 U.S.C. section 360bbb-3(b)(1), unless the authorization is terminated or revoked.  Performed at [facility], [address] [place], [place] [address]      Time coordinating discharge: Over 30 minutes  SIGNED:   Cipriano Bunker, MD  [facility] 03/01/2021, 12:34 PM Pager   If 7PM-7AM, please contact night-coverage

## 2021-03-01 NOTE — Discharge Instructions (Signed)
Advised to follow up PCP in one week. Advised to take zithromax for three more days. Advised prednisone 40 mg daily for three more days.

## 2021-03-01 NOTE — NC FL2 (Signed)
[place] MEDICAID FL2 LEVEL OF CARE SCREENING TOOL     IDENTIFICATION  Patient Name: Valerie Bowman Birthdate: Jun 24, 1935 Sex: female Admission Date (Current Location): 02/27/2021  [facility] and [place] Number:  Producer, television/film/video and Address:  [facility],  [address], [place] [address]      Provider Number: [id number]  Attending Physician Name and Address:  Cipriano Bunker, MD  Relative Name and Phone Number:  Buel Ream (Daughter)   [phone]    Current Level of Care: Hospital Recommended Level of Care: Assisted Living Facility Prior Approval Number:    Date Approved/Denied:   PASRR Number:    Discharge Plan: Domiciliary (Rest home) [facility])    Current Diagnoses: Patient Active Problem List   Diagnosis Date Noted   SOB (shortness of breath) 02/28/2021   Acquired hypothyroidism    Essential hypertension    Chronic diastolic heart failure (HCC)    Acute exacerbation of chronic obstructive pulmonary disease (COPD) (HCC) 02/27/2021   Low back pain 02/20/2021    Orientation RESPIRATION BLADDER Height & Weight     Self, Time, Situation, Place  O2 (2L [place] while sleeping) Continent Weight: 86.5 kg Height:  5\' 7"  (170.2 cm)  BEHAVIORAL SYMPTOMS/MOOD NEUROLOGICAL BOWEL NUTRITION STATUS      Continent Diet (Regular)  AMBULATORY STATUS COMMUNICATION OF NEEDS Skin   Supervision Verbally Normal                       Personal Care Assistance Level of Assistance  Bathing, Feeding, Dressing Bathing Assistance: Limited assistance Feeding assistance: Independent Dressing Assistance: Independent     Functional Limitations Info  Sight, Hearing, Speech Sight Info: Adequate Hearing Info: Adequate Speech Info: Adequate    SPECIAL CARE FACTORS FREQUENCY                       Contractures Contractures Info: Not present    Additional Factors Info  Code Status, Allergies Code Status Info: DNR Allergies Info: Penicillins, Sulfa  Antibiotics           Current Medications (03/01/2021):  This is the current hospital active medication list Current Facility-Administered Medications  Medication Dose Route Frequency Provider Last Rate Last Admin   acetaminophen (TYLENOL) tablet 650 mg  650 mg Oral Q6H PRN Howerter, Justin B, DO       Or   acetaminophen (TYLENOL) suppository 650 mg  650 mg Rectal Q6H PRN Howerter, Justin B, DO       albuterol (PROVENTIL) (2.5 MG/3ML) 0.083% nebulizer solution 2.5 mg  2.5 mg Nebulization Q4H PRN Howerter, Justin B, DO       amLODipine (NORVASC) tablet 5 mg  5 mg Oral Daily Howerter, Justin B, DO   5 mg at 02/28/21 0932   aspirin chewable tablet 81 mg  81 mg Oral Once Howerter, Justin B, DO       azithromycin (ZITHROMAX) tablet 500 mg  500 mg Oral Daily 03/02/21, RPH   500 mg at 02/28/21 2116   chlorhexidine (PERIDEX) 0.12 % solution 15 mL  15 mL Mouth Rinse BID Howerter, Justin B, DO   15 mL at 02/28/21 2116   enalapril (VASOTEC) tablet 20 mg  20 mg Oral BID Howerter, Justin B, DO   20 mg at 02/28/21 2116   ipratropium-albuterol (DUONEB) 0.5-2.5 (3) MG/3ML nebulizer solution 3 mL  3 mL Nebulization TID Howerter, Justin B, DO   3 mL at 03/01/21 [phone]  levothyroxine (SYNTHROID) tablet 125 mcg  125 mcg Oral Q0600 Howerter, Justin B, DO   125 mcg at 03/01/21 0506   MEDLINE mouth rinse  15 mL Mouth Rinse q12n4p Howerter, Justin B, DO       methylPREDNISolone sodium succinate (SOLU-MEDROL) 125 mg/2 mL injection 80 mg  80 mg Intravenous Q12H Cipriano Bunker, MD   80 mg at 02/28/21 2230   metoprolol succinate (TOPROL-XL) 24 hr tablet 50 mg  50 mg Oral Daily Howerter, Justin B, DO   50 mg at 02/28/21 6213   polyethylene glycol (MIRALAX / GLYCOLAX) packet 17 g  17 g Oral Daily Cipriano Bunker, MD       senna Mancel Parsons) tablet 8.6 mg  1 tablet Oral BID Cipriano Bunker, MD   8.6 mg at 02/28/21 2116     Discharge Medications: Please see discharge summary for a list of discharge  medications.  Relevant Imaging Results:  Relevant Lab Results:   Additional Information    Ida Rogue, LCSW

## 2021-03-01 NOTE — Progress Notes (Signed)
Pt report having some hallucinations during the night and she wants to know which of her medications can contribute to that. Dionne Bucy RN

## 2021-03-01 NOTE — Telephone Encounter (Signed)
Called patient left message to return call to schedule an MRI review with Dr. Cleophas Dunker after 03/18/2021

## 2021-03-01 NOTE — TOC Transition Note (Signed)
Transition of Care [facility]) - CM/SW Discharge Note   Patient Details  Name: [name] MRN: [id number] Date of Birth: August 20, 1935  Transition of Care [facility]) CM/SW Contact:  Ida Rogue, LCSW Phone Number: 03/01/2021, 11:29 AM   Clinical Narrative:   Patient who is stable for discharge will transfer back to [facility].  Daughter will transport.  FL2 and D/C summary faxed to [phone].  Nursing, please call report to [phone], ask for med tech or nurse. TOC sign off.     Final next level of care: Assisted Living Barriers to Discharge: No Barriers Identified   Patient Goals and CMS Choice        Discharge Placement                       Discharge Plan and Services                                     Social Determinants of Health (SDOH) Interventions     Readmission Risk Interventions No flowsheet data found.

## 2021-03-01 NOTE — Plan of Care (Signed)

## 2021-03-18 ENCOUNTER — Ambulatory Visit
Admit: 2021-03-18 | Discharge: 2021-03-18 | Disposition: A | Discharge status: Home / Self Care | Payer: Medicare Other | Attending: Orthopaedic Surgery | Admitting: Orthopaedic Surgery

## 2021-03-18 ENCOUNTER — Other Ambulatory Visit: Payer: Self-pay

## 2021-03-18 DIAGNOSIS — G8929 Other chronic pain: Secondary | ICD-10-CM

## 2021-03-18 IMAGING — MR MR LUMBAR SPINE W/O CM
4 of 5 series · 27 of 48 positions shown · non-contrast
Comparison: None.

CLINICAL DATA: Lower back pain

EXAM:
MRI LUMBAR SPINE WITHOUT CONTRAST
TECHNIQUE: Multiplanar, multisequence MR imaging of the lumbar spine was
performed. No intravenous contrast was administered.

[Series 3: T2 · sagittal · 4.0mm · 1.09mm/px · 7 of 17 slices shown (1 of 2)]
[im 1/17]
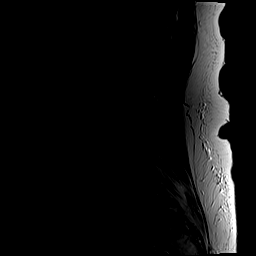
[im 3/17]
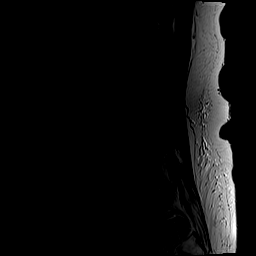
[im 6/17]
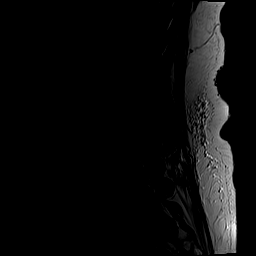
[im 9/17]
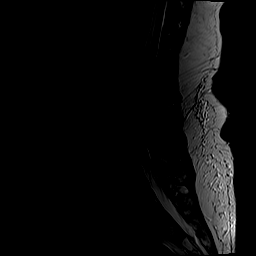
[im 11/17]
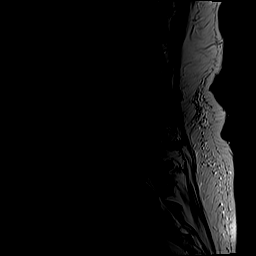
[im 14/17]
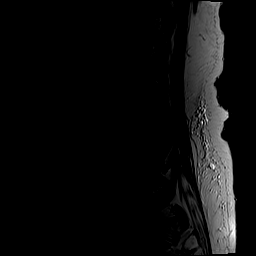
[im 17/17]
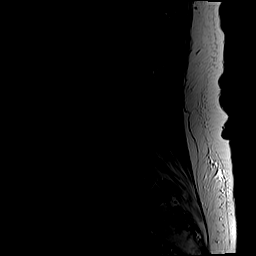

[Series 5: T1 · sagittal · 4.0mm · 1.09mm/px · 6 of 17 slices shown (1 of 2)]
[im 1/17]
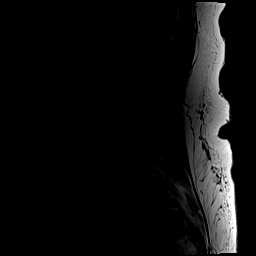
[im 4/17]
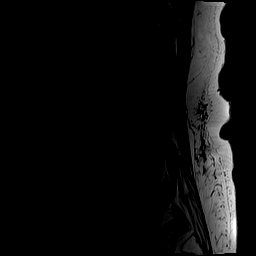
[im 7/17]
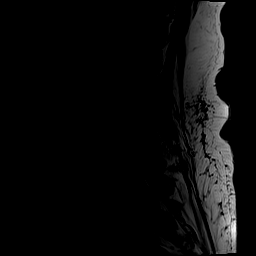
[im 10/17]
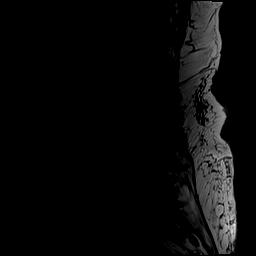
[im 13/17]
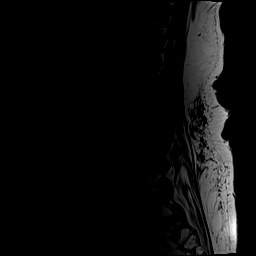
[im 17/17]
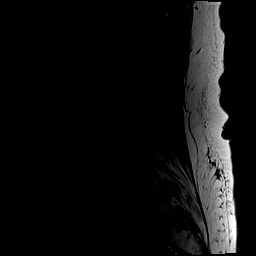

[Series 6: T2 · axial · 4.0mm · 0.39mm/px · z∈[-73,+139]mm · 8 of 38 slices shown (2 of 2)]
[im 1/38]
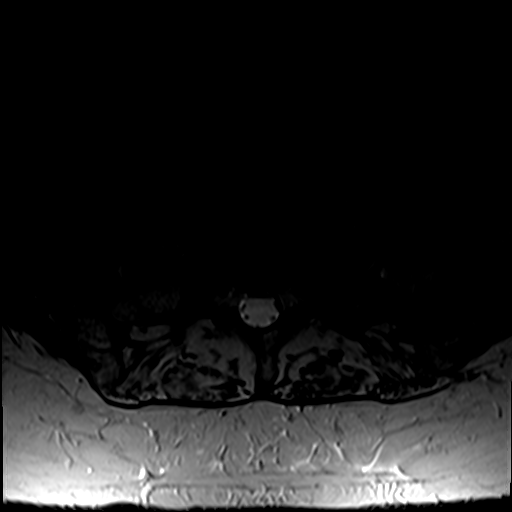
[im 6/38]
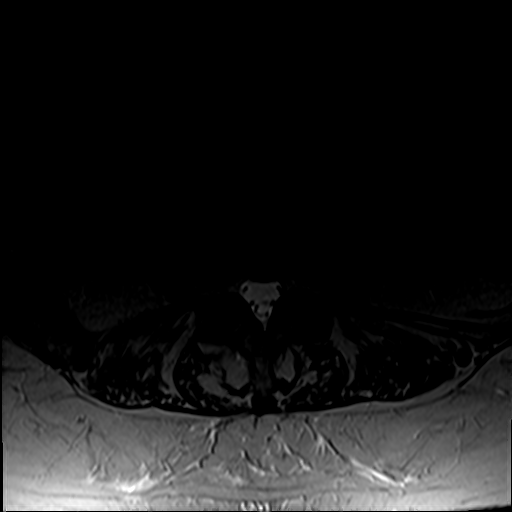
[im 12/38]
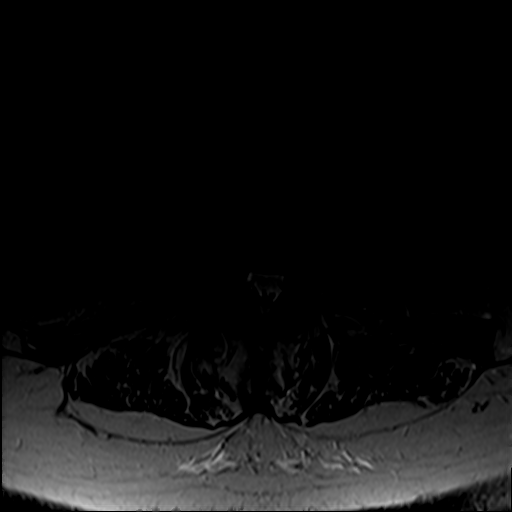
[im 18/38]
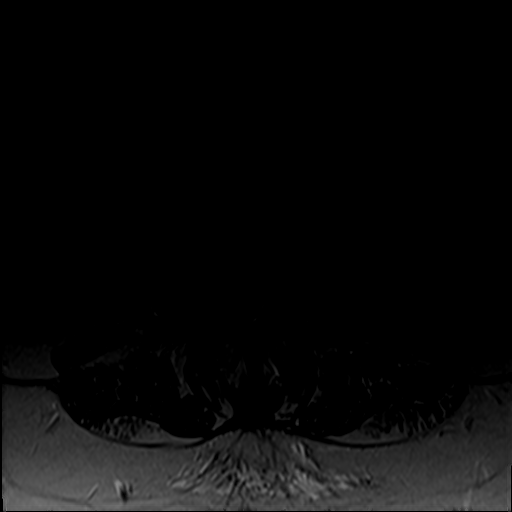
[im 20/38]
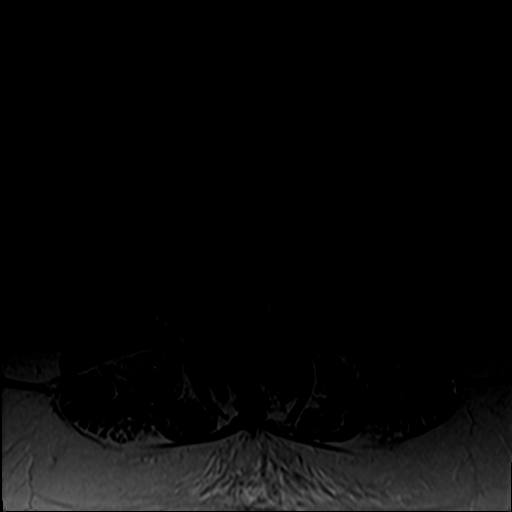
[im 26/38]
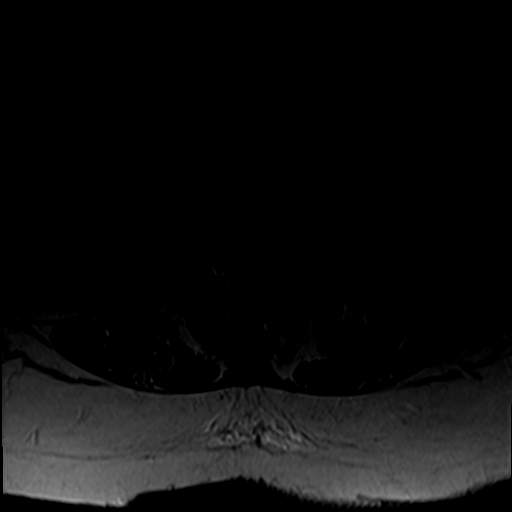
[im 32/38]
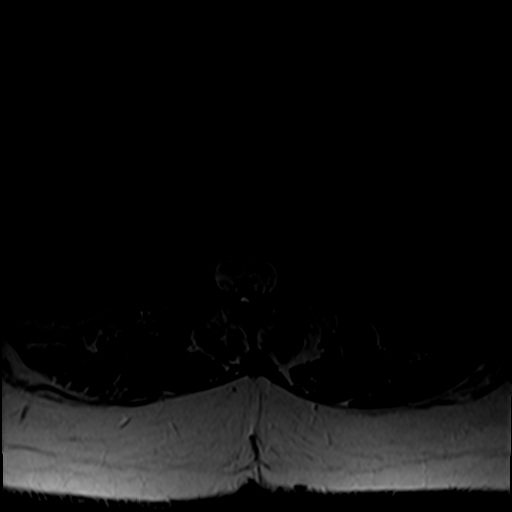
[im 38/38]
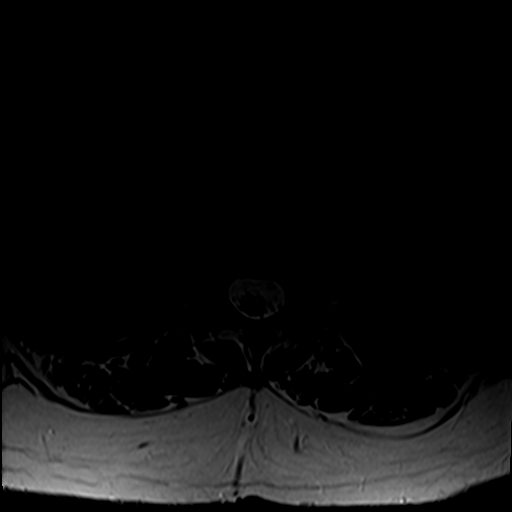

[Series 7: T1 · axial · 4.0mm · 0.39mm/px · z∈[-73,+111]mm · 6 of 38 slices shown (2 of 2)]
[im 1/38]
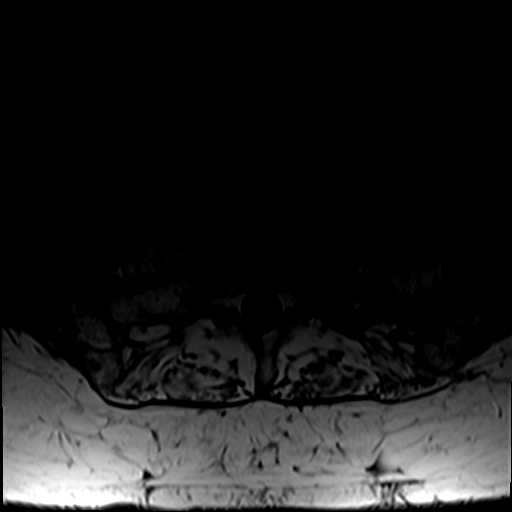
[im 6/38]
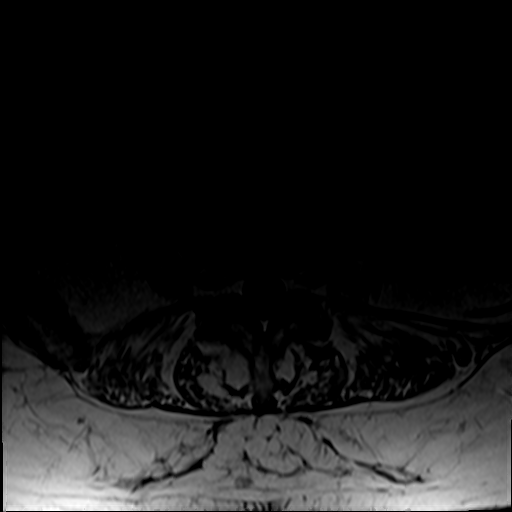
[im 12/38]
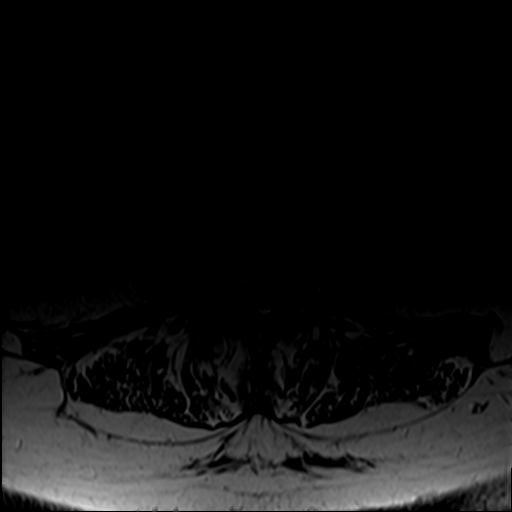
[im 18/38]
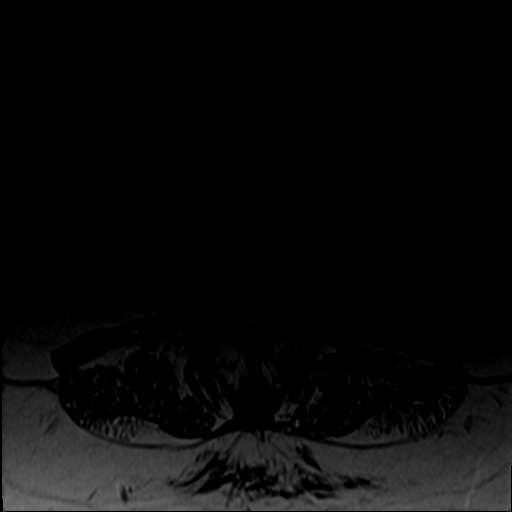
[im 20/38]
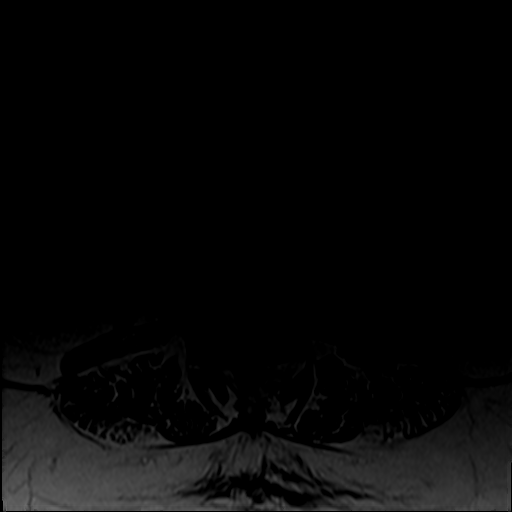
[im 32/38]
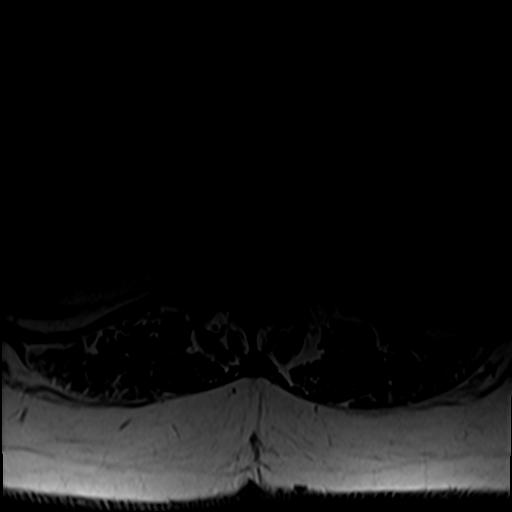

[27 of 48 positions shown; findings below may reference images not displayed]

FINDINGS: Segmentation: There is a transitional lumbosacral vertebrae with
partially lumbarized S1 and rudimentary disc at S1-S2. These are
numbered accordingly. Independent review of these levels should be
performed prior to any planned intervention.

Alignment:  Physiologic.

Vertebrae: There is no evidence of acute fracture, discitis, or
aggressive osseous lesion. There are degenerative endplate changes,
most prominent at L2-L3.

Conus medullaris and cauda equina: Conus extends to the L1-L2 level.
Conus and cauda equina appear normal.

Paraspinal and other soft tissues: Mild paraspinal muscle atrophy.
No other significant paraspinal abnormality.

Disc levels:

T11-T12: No significant spinal canal or neural foraminal narrowing.

T12-L1: No significant spinal canal or neural foraminal narrowing.

L1-L2: No significant spinal canal or neural foraminal narrowing.

L2-L3: Severe left-sided disc height loss with broad-based disc
bulging and endplate spurring. Bilateral facet arthropathy and
ligamentum flavum hypertrophy. There is mild spinal canal stenosis,
moderate left and no significant right neural foraminal narrowing.

L3-L4: Broad-based disc bulging, ligamentum flavum hypertrophy, and
bilateral facet arthropathy results in mild-to-moderate spinal canal
stenosis, mild right and moderate left neural foraminal narrowing.
There is potential impingement of the exiting left-sided nerve root
(sagittal T2 image 12).

L4-L5: Severe right-sided disc height loss with degenerative
endplate spurring and mild disc bulging. Ligamentum flavum
hypertrophy and bilateral facet arthropathy right worse than left.
Mild spinal canal stenosis. Moderate-severe right and no significant
left neural foraminal narrowing. There is potentially impingement of
the exiting right-sided nerve root.

L5-S1: Severe disc height loss with Modic type 2 degenerative
endplate change, endplate spurring and minimal disc bulging. Mild
bilateral facet arthropathy and ligamentum flavum hypertrophy. No
significant spinal canal stenosis. No significant neural foraminal
narrowing.
IMPRESSION: Multilevel degenerative changes of the lumbar spine, as summarized
below:

L2-L3: Mild spinal canal stenosis. Moderate left neural foraminal
narrowing.

L3-L4: Mild-to-moderate spinal canal stenosis. Mild right and
moderate left neural foraminal narrowing. Potential impingement of
the exiting left-sided nerve root.

L4-L5: Mild spinal canal stenosis. Moderate-severe right neural
foraminal narrowing with potential impingement of the exiting
right-sided nerve root.

L5-S1: Severe degenerative disc disease without significant spinal
canal or neural foraminal stenosis.

Transitional lumbosacral vertebrae is present. Independent review of
these levels should be performed prior to any planned intervention.

## 2021-03-20 ENCOUNTER — Ambulatory Visit (INDEPENDENT_AMBULATORY_CARE_PROVIDER_SITE_OTHER): Payer: Medicare Other

## 2021-03-20 ENCOUNTER — Encounter: Payer: Self-pay | Admitting: Orthopaedic Surgery

## 2021-03-20 ENCOUNTER — Ambulatory Visit (INDEPENDENT_AMBULATORY_CARE_PROVIDER_SITE_OTHER): Payer: Medicare Other | Admitting: Orthopaedic Surgery

## 2021-03-20 ENCOUNTER — Other Ambulatory Visit: Payer: Self-pay

## 2021-03-20 DIAGNOSIS — M25511 Pain in right shoulder: Secondary | ICD-10-CM

## 2021-03-20 DIAGNOSIS — M545 Low back pain, unspecified: Secondary | ICD-10-CM

## 2021-03-20 DIAGNOSIS — G8929 Other chronic pain: Secondary | ICD-10-CM

## 2021-03-20 NOTE — Progress Notes (Signed)
Office Visit Note   Patient: Valerie Bowman           Date of Birth: [birth year]           MRN: [id number] Visit Date: 03/20/2021              Requested by: Herschel Senegal, MD [address] [place],  [place] [address] PCP: Herschel Senegal, MD   Assessment & Plan: Visit Diagnoses:  1. Chronic right shoulder pain   2. Chronic right-sided low back pain without sciatica     Plan: Valerie Bowman had an MRI scan of her lumbar spine demonstrating diffuse degenerative changes throughout the lumbar spine associated with central and foraminal stenosis.  There was no acute change or evidence of malignancy.  She is oxygen dependent.  She notes that she is getting some therapy at [facility] where she resides but not sure if it is related to her back.  She has more trouble when she is standing or trying to walk with her low back and very minimal discomfort in her legs.  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  This has been evaluated elsewhere in the past.  She is not taking any specific medicine for that.  Long discussion regarding the pathology in her lumbar spine and treatment options.  She would like to try the therapy and I will give her prescription for the therapist at [facility].  I have also discussed an epidural steroid injection and she wants to think about it.  Seems to be fine just sitting down when she has her pain which alleviates her pain. Since she relates having some chronic issues with her right shoulder.  She has had some difficulty raising her arm over her head associated with pain in the anterior lateral subacromial region and into the deltoid muscle.  X-rays demonstrate some degenerative change at the [facility] joint and prominence of the inferior acromion which might predispose her to having a rotator cuff tear but I did not see any acute changes or degenerative arthritis of the glenohumeral joint.  Long discussion regarding potential causes of her pain and even the  possibility of an MRI scan.  Or even a cortisone injection .,she preferred just to give it time .did not have any further questions  Follow-Up Instructions: Return if symptoms worsen or fail to improve.   Orders:  Orders Placed This Encounter  Procedures   XR Shoulder Right   No orders of the defined types were placed in this encounter.     Procedures: No procedures performed   Clinical Data: No additional findings.   Subjective: Chief Complaint  Patient presents with   Lower Back - Follow-up    MRI review  Patient presents today for follow up on her lower back. She had an MRI and is here today for her results.  Also relates having some chronic issues with her right shoulder particularly with overhead activity.  Not having any specific neck pain  HPI  Review of Systems   Objective: Vital Signs: There were no vitals taken for this visit.  Physical Exam Constitutional:      Appearance: She is well-developed.  Pulmonary:     Effort: Pulmonary effort is normal.  Skin:    General: Skin is warm and dry.  Neurological:     Mental Status: She is alert and oriented to person, place, and time.  Psychiatric:        Behavior: Behavior normal.  Ortho Exam oxygen dependent.  Nasal cannula in place.  Did not appear to be short of breath.  Alert and oriented x3.  Straight leg raise negative.  There was some very mild percussible tenderness of the lumbar spine.  She did not have any pain with range of motion of either hip.  She will bit more pain on the in the right paralumbar sacral area than on the left but no masses.  Right shoulder with painful arc of motion.  Positive impingement and discomfort along the anterior lateral subacromial region biceps appears to be intact but there is a lot of atrophy about the shoulder.  She is able to grip and release with good sensation of her right hand  Specialty Comments:  No specialty comments available.  Imaging: No results  found.   PMFS History: Patient Active Problem List   Diagnosis Date Noted   Pain in right shoulder 03/20/2021   SOB (shortness of breath) 02/28/2021   Acquired hypothyroidism    Essential hypertension    Chronic diastolic heart failure (HCC)    Acute exacerbation of chronic obstructive pulmonary disease (COPD) (HCC) 02/27/2021   Low back pain 02/20/2021   Past Medical History:  Diagnosis Date   Acquired hypothyroidism    Chronic diastolic heart failure (HCC)    COPD (chronic obstructive pulmonary disease) (HCC)    Essential hypertension    Hyperlipidemia    Urinary incontinence     History reviewed. No pertinent family history.  History reviewed. No pertinent surgical history. Social History   Occupational History   Not on file  Tobacco Use   Smoking status: Former    Packs/day: 1.00    Years: 30.00    Pack years: 30.00    Types: Cigarettes    Quit date: 1995    Years since quitting: 28.0   Smokeless tobacco: Never  Substance and Sexual Activity   Alcohol use: Not Currently   Drug use: Never   Sexual activity: Not on file

## 2021-04-15 ENCOUNTER — Encounter: Payer: Self-pay | Admitting: Orthopaedic Surgery

## 2021-04-16 NOTE — Telephone Encounter (Signed)
Please call patient and schedule an appt for a cortisone injection

## 2021-04-23 ENCOUNTER — Emergency Department (HOSPITAL_COMMUNITY): Payer: Medicare Other

## 2021-04-23 ENCOUNTER — Emergency Department (HOSPITAL_BASED_OUTPATIENT_CLINIC_OR_DEPARTMENT_OTHER): Payer: Medicare Other

## 2021-04-23 ENCOUNTER — Emergency Department (HOSPITAL_COMMUNITY)
Admission: EM | Admit: 2021-04-23 | Discharge: 2021-04-23 | Disposition: A | Discharge status: Home / Self Care | Payer: Medicare Other | Attending: Emergency Medicine | Admitting: Emergency Medicine

## 2021-04-23 ENCOUNTER — Encounter (HOSPITAL_COMMUNITY): Payer: Self-pay | Admitting: Emergency Medicine

## 2021-04-23 DIAGNOSIS — Z7951 Long term (current) use of inhaled steroids: Secondary | ICD-10-CM | POA: Diagnosis not present

## 2021-04-23 DIAGNOSIS — I70213 Atherosclerosis of native arteries of extremities with intermittent claudication, bilateral legs: Secondary | ICD-10-CM

## 2021-04-23 DIAGNOSIS — Z79899 Other long term (current) drug therapy: Secondary | ICD-10-CM | POA: Insufficient documentation

## 2021-04-23 DIAGNOSIS — J449 Chronic obstructive pulmonary disease, unspecified: Secondary | ICD-10-CM | POA: Insufficient documentation

## 2021-04-23 DIAGNOSIS — I5032 Chronic diastolic (congestive) heart failure: Secondary | ICD-10-CM | POA: Diagnosis not present

## 2021-04-23 DIAGNOSIS — M5442 Lumbago with sciatica, left side: Secondary | ICD-10-CM | POA: Diagnosis not present

## 2021-04-23 DIAGNOSIS — M79605 Pain in left leg: Secondary | ICD-10-CM | POA: Diagnosis not present

## 2021-04-23 DIAGNOSIS — I110 Hypertensive heart disease with heart failure: Secondary | ICD-10-CM | POA: Insufficient documentation

## 2021-04-23 DIAGNOSIS — M545 Low back pain, unspecified: Secondary | ICD-10-CM | POA: Diagnosis present

## 2021-04-23 DIAGNOSIS — Z7982 Long term (current) use of aspirin: Secondary | ICD-10-CM | POA: Insufficient documentation

## 2021-04-23 LAB — CBC
HCT: 38.7 % (ref 36.0–46.0)
Hemoglobin: 12.3 g/dL (ref 12.0–15.0)
MCH: 30.8 pg (ref 26.0–34.0)
MCHC: 31.8 g/dL (ref 30.0–36.0)
MCV: 96.8 fL (ref 80.0–100.0)
Platelets: 178 10*3/uL (ref 150–400)
RBC: 4 MIL/uL (ref 3.87–5.11)
RDW: 13.3 % (ref 11.5–15.5)
WBC: 7.5 10*3/uL (ref 4.0–10.5)
nRBC: 0 % (ref 0.0–0.2)

## 2021-04-23 LAB — BASIC METABOLIC PANEL
Anion gap: 8 (ref 5–15)
BUN: 16 mg/dL (ref 8–23)
CO2: 31 mmol/L (ref 22–32)
Calcium: 8.8 mg/dL — ABNORMAL LOW (ref 8.9–10.3)
Chloride: 98 mmol/L (ref 98–111)
Creatinine, Ser: 0.64 mg/dL (ref 0.44–1.00)
GFR, Estimated: >60 mL/min (ref >60)
Glucose, Bld: 103 mg/dL — ABNORMAL HIGH (ref 70–99)
Potassium: 4.2 mmol/L (ref 3.5–5.1)
Sodium: 137 mmol/L (ref 135–145)

## 2021-04-23 IMAGING — DX DG HIP (WITH OR WITHOUT PELVIS) 2-3V*L*
3 series · 3 of 3 positions shown · non-contrast
Comparison: None.

CLINICAL DATA: Sudden onset low back pain.

EXAM:
DG HIP (WITH OR WITHOUT PELVIS) 2-3V LEFT

[t pelvis ap]
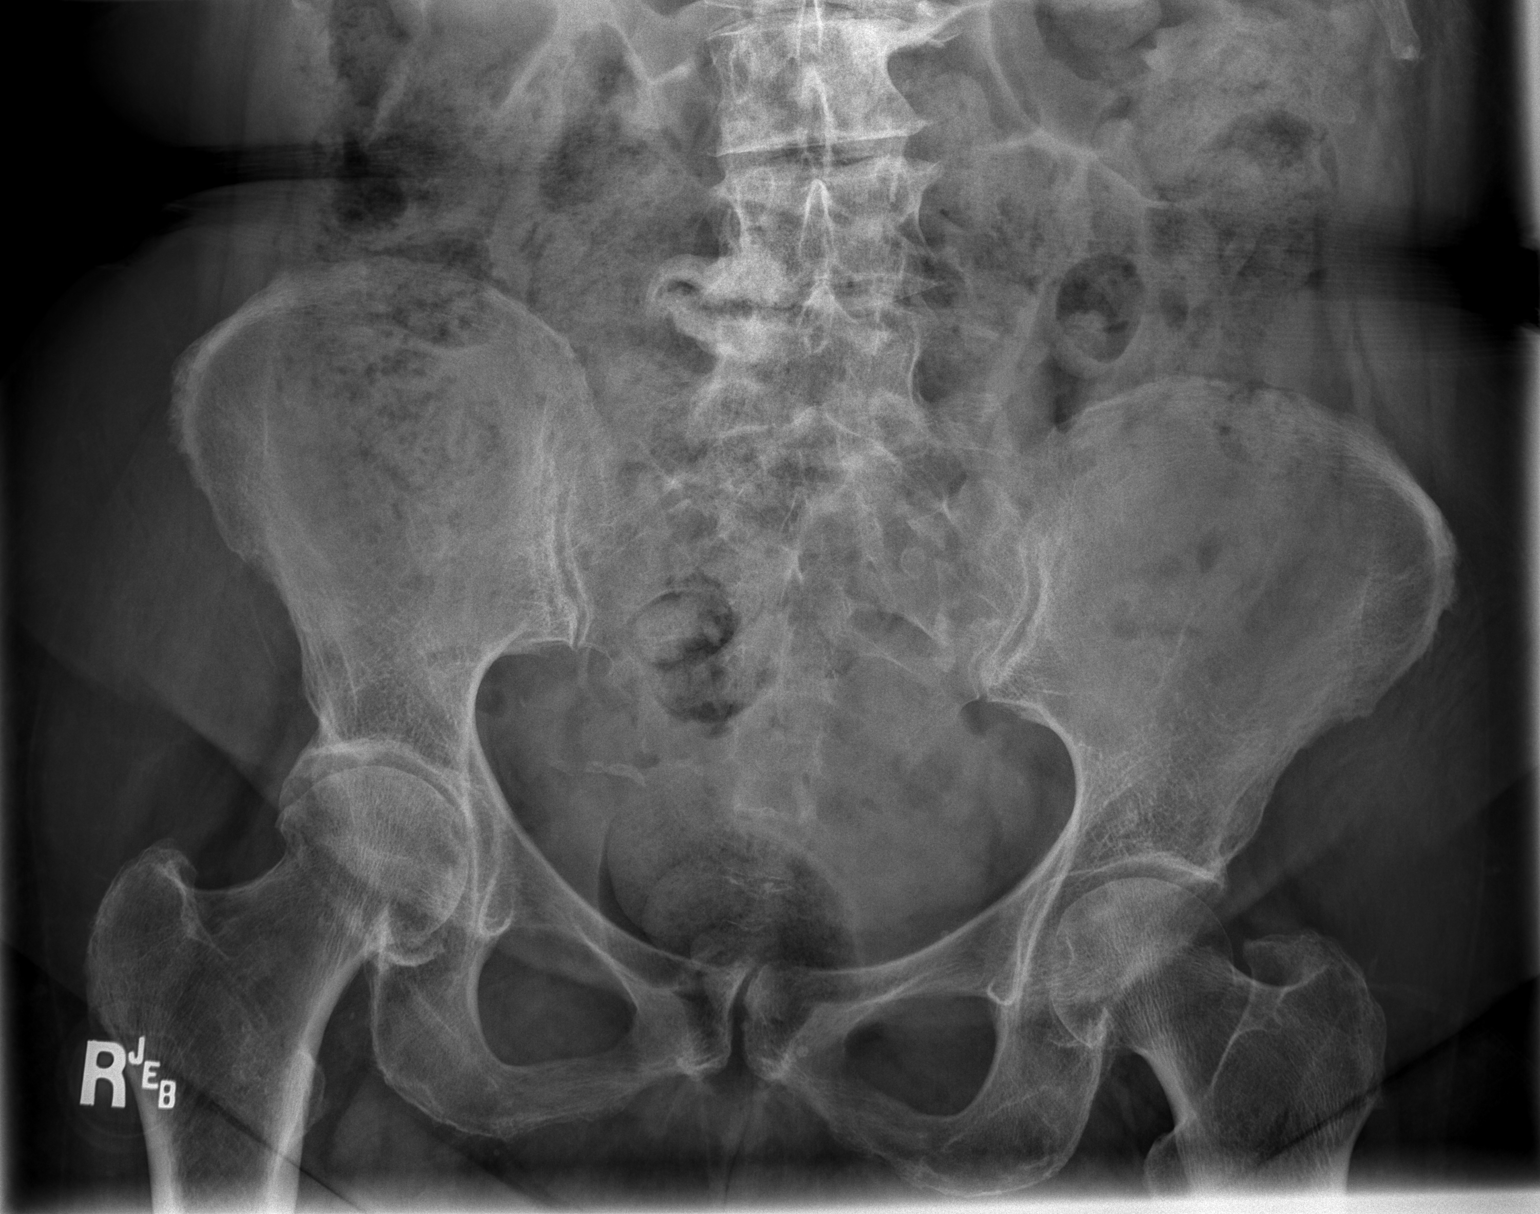

[t hip ap left]
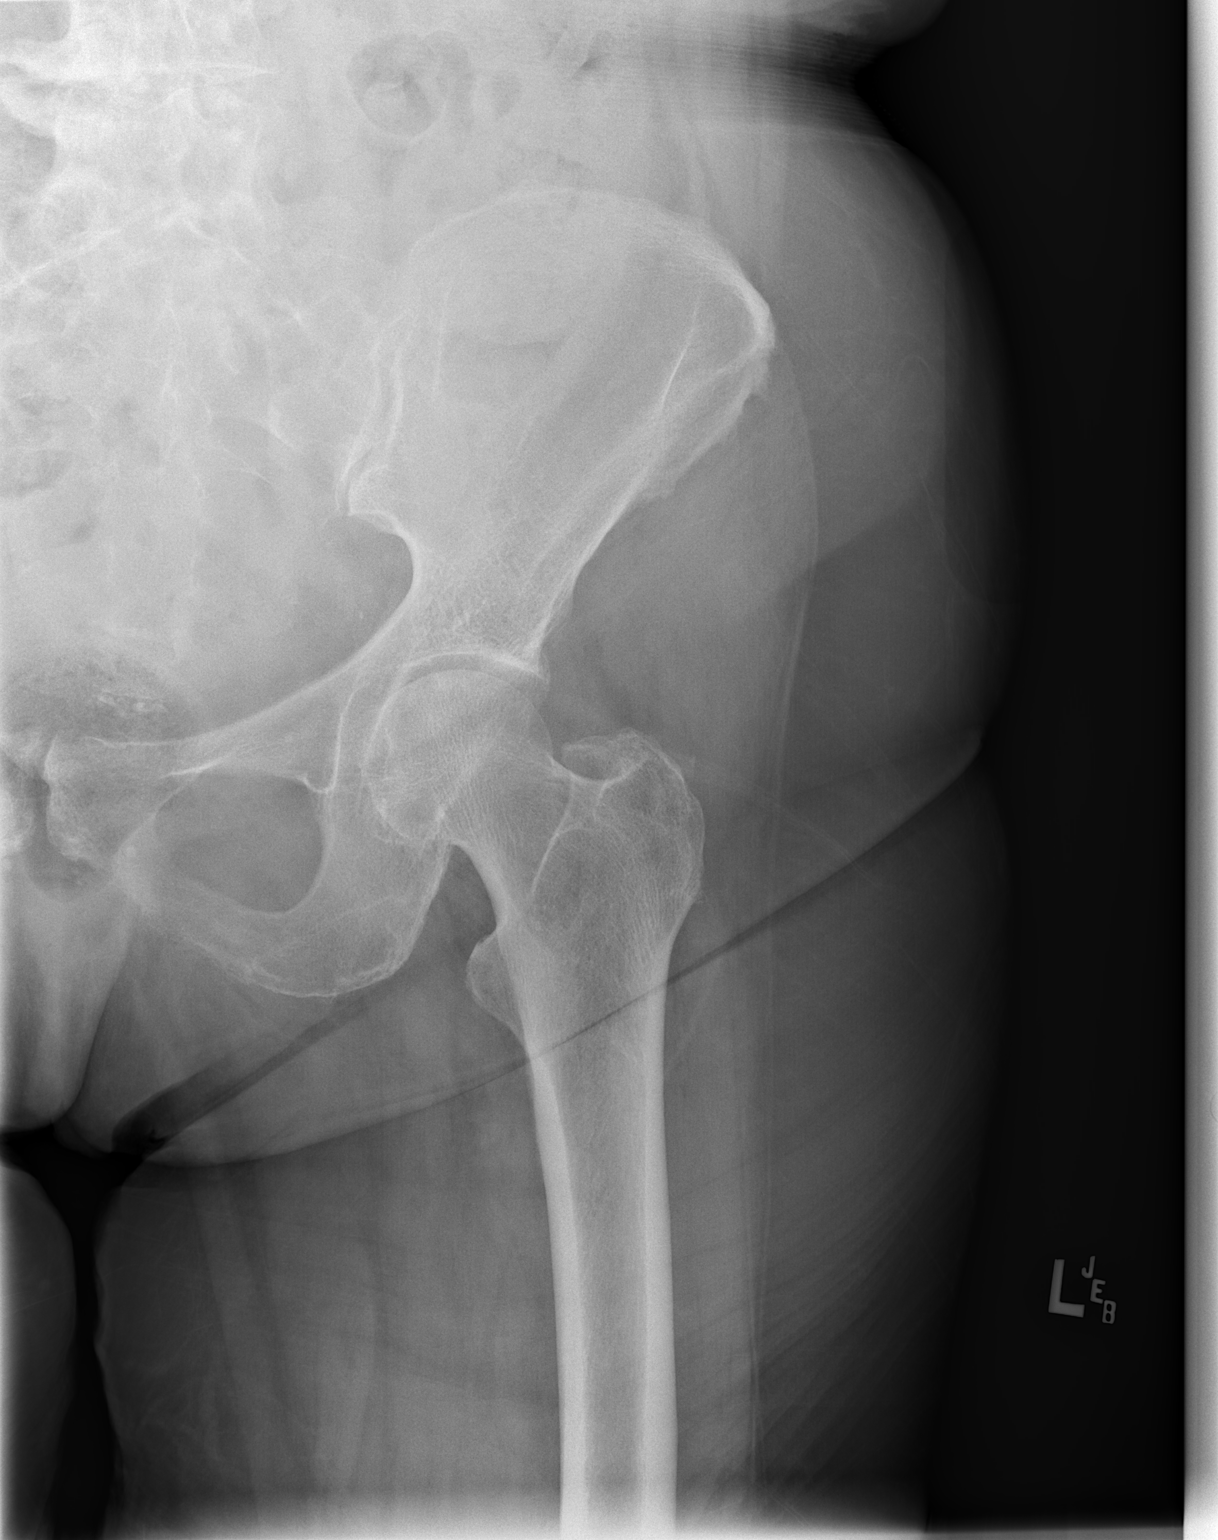

[t hip frog leg left]
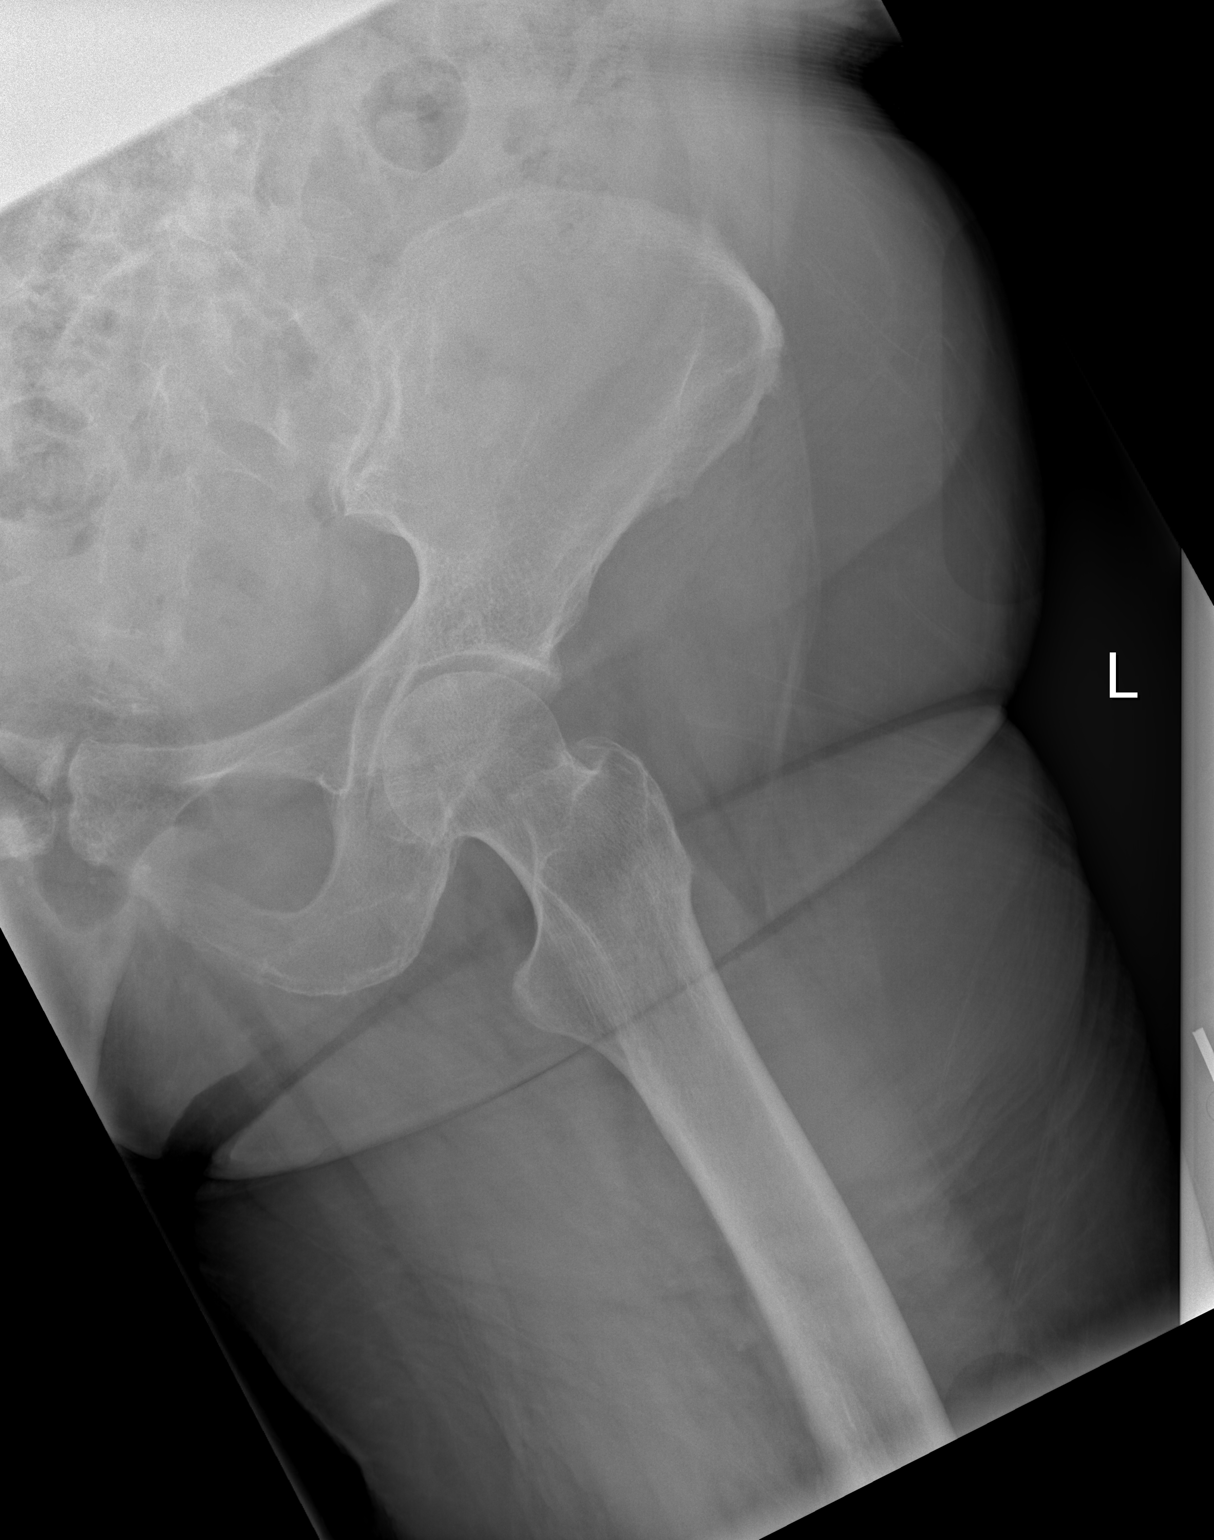

[3 of 3 positions shown; findings below may reference images not displayed]

FINDINGS: No evidence for acute fracture involving the bony anatomy of the
pelvis. SI joints and symphysis pubis are unremarkable. Mild
degenerative changes noted in both hips, right greater than left. AP
and frogleg lateral views of the left hip show no evidence for
proximal femur fracture.
IMPRESSION: Negative.

## 2021-04-23 IMAGING — CT CT HIP*L* W/O CM
2 of 3 series · 17 of 46 positions shown, 19 images · non-contrast
Comparison: Pelvis and left hip radiographs [DATE] AP pelvis
[DATE]

CLINICAL DATA: Hip pain, chronic.  Osteoarthritis suspected.

EXAM:
CT OF THE LEFT HIP WITHOUT CONTRAST
TECHNIQUE: Multidetector CT imaging of the left hip was performed according to
the standard protocol. Multiplanar CT image reconstructions were
also generated.
RADIATION DOSE REDUCTION: This exam was performed according to the
departmental dose-optimization program which includes automated
exposure control, adjustment of the mA and/or kV according to
patient size and/or use of iterative reconstruction technique.

[Series 5: hip 2.0 st · axial · 0.52mm/px · z∈[+654,+842]mm · 14 of 108 slices shown, 16 images]
[im 7/108  soft-tissue]
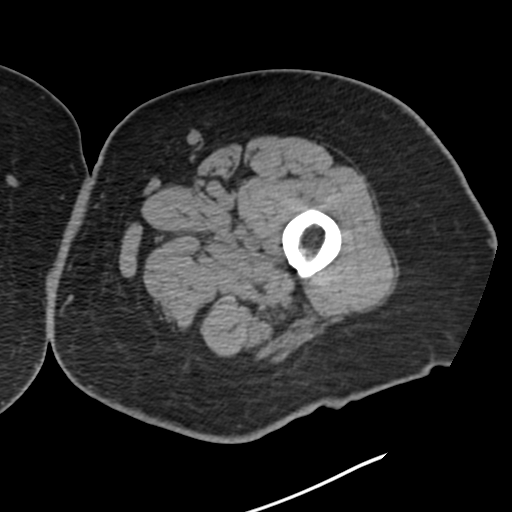
[im 7/108  bone]
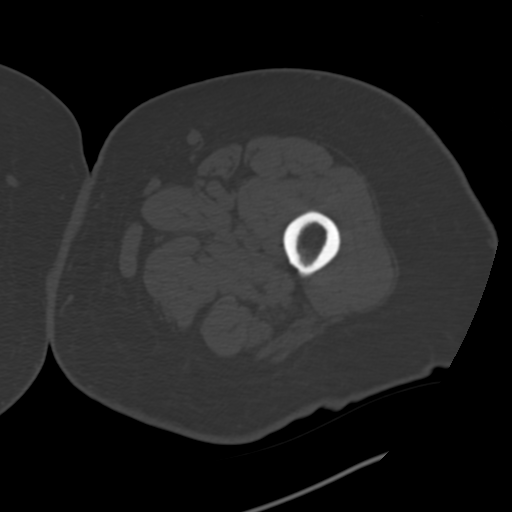
[im 14/108  soft-tissue]
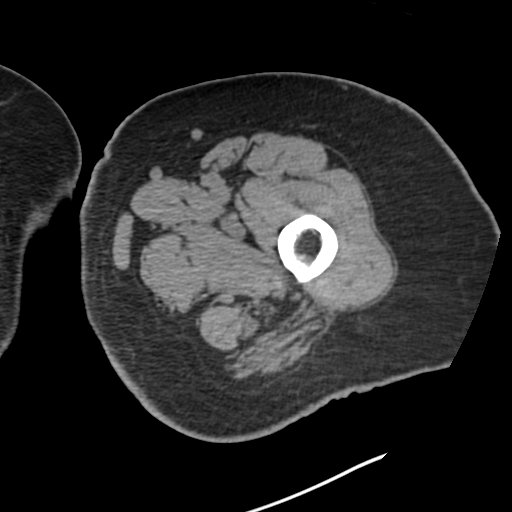
[im 21/108  soft-tissue]
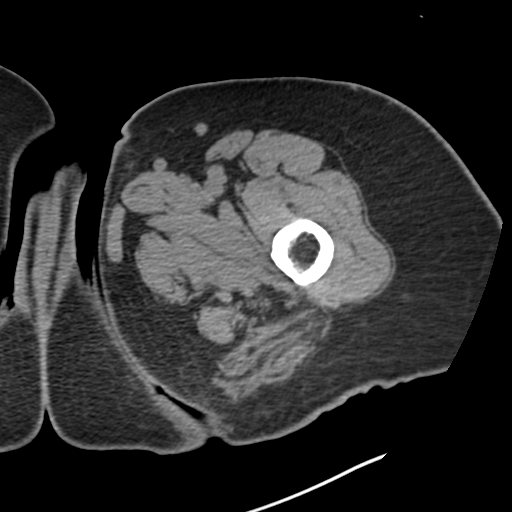
[im 28/108  soft-tissue]
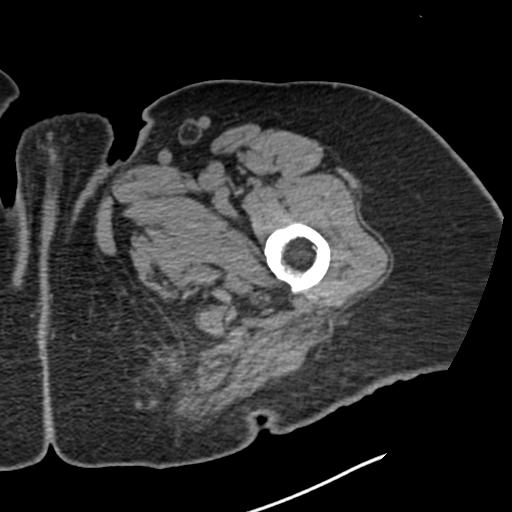
[im 35/108  soft-tissue]
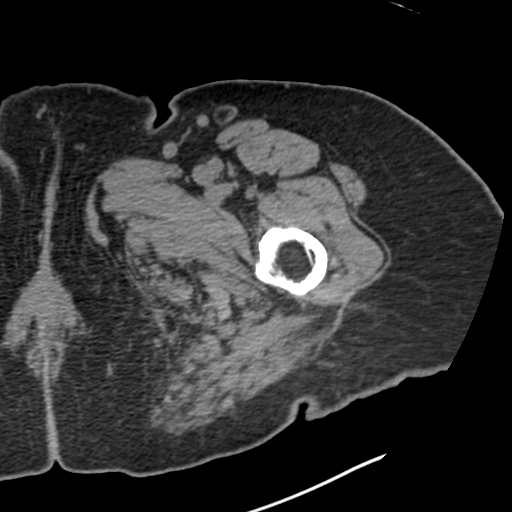
[im 42/108  soft-tissue]
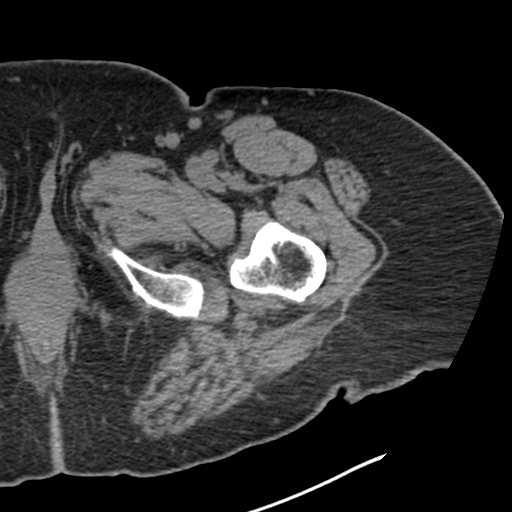
[im 49/108  soft-tissue]
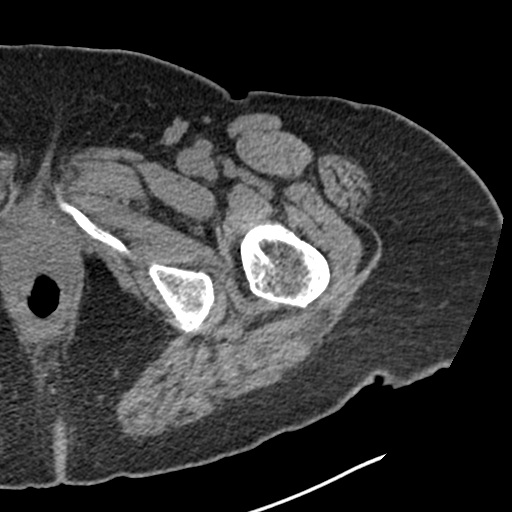
[im 59/108  soft-tissue]
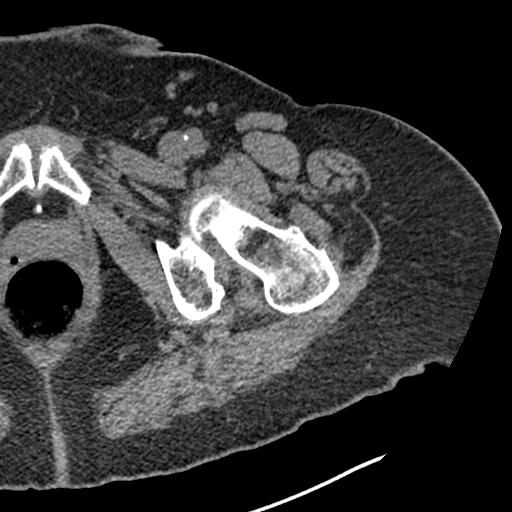
[im 66/108  soft-tissue]
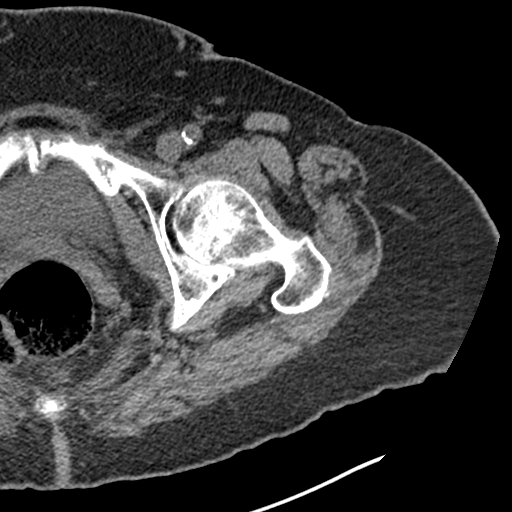
[im 66/108  bone]
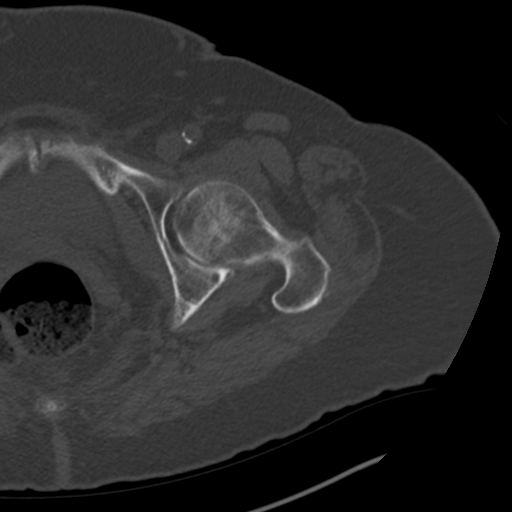
[im 73/108  soft-tissue]
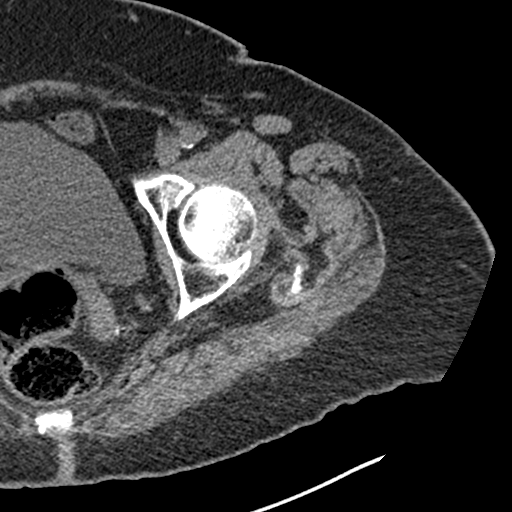
[im 80/108  soft-tissue]
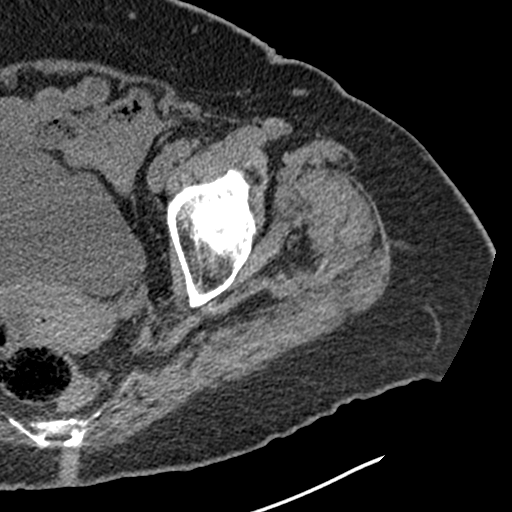
[im 87/108  soft-tissue]
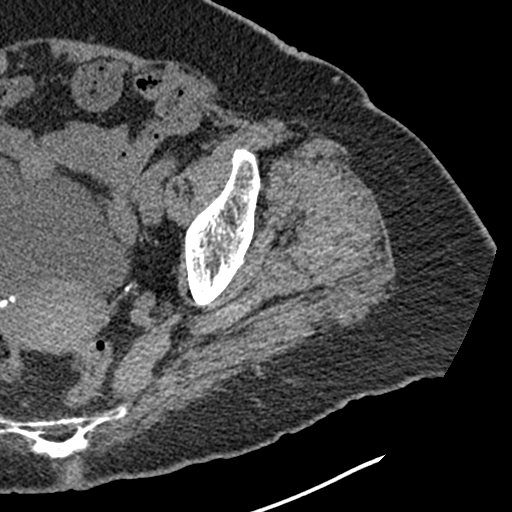
[im 94/108  soft-tissue]
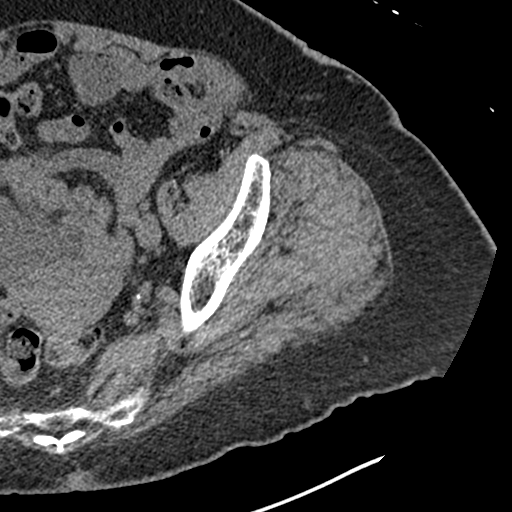
[im 101/108  soft-tissue]
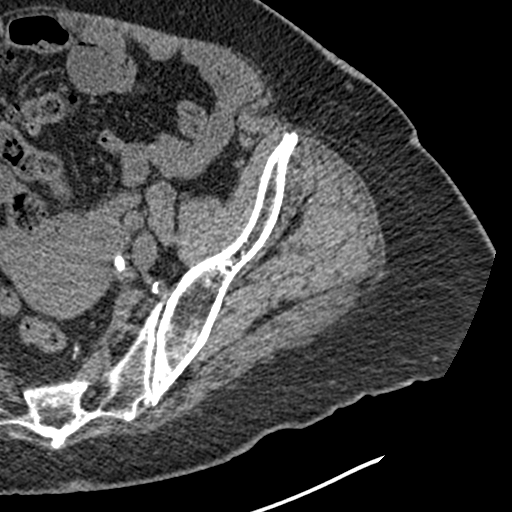

[Series 8: hip 2.0 cor. st · coronal · 0.42mm/px · 3 of 121 slices shown]
[im 41/121  soft-tissue]
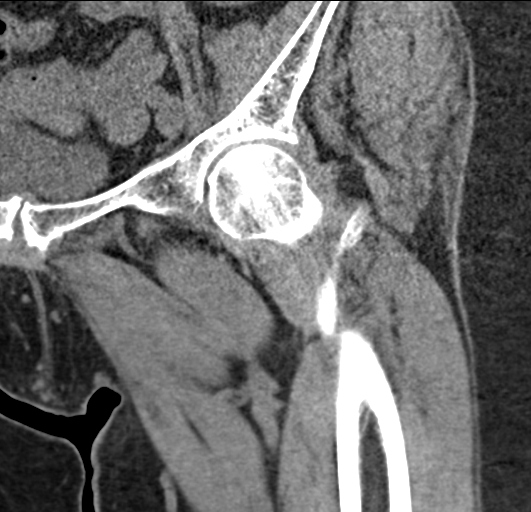
[im 54/121  soft-tissue]
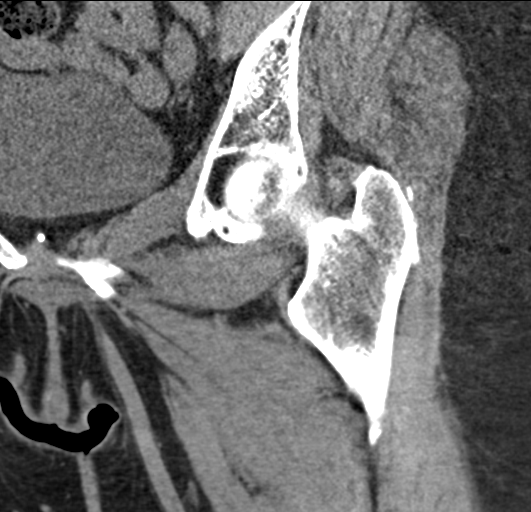
[im 67/121  soft-tissue]
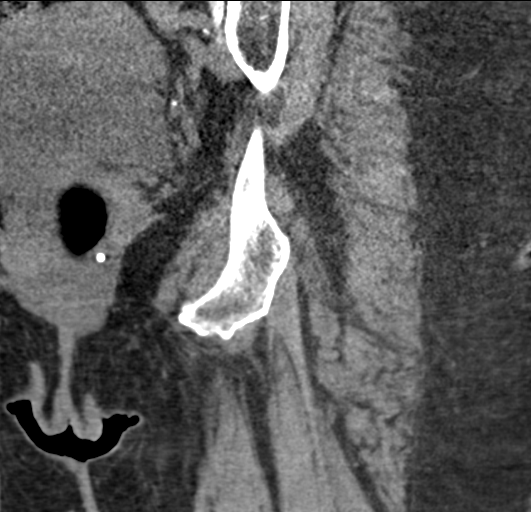

[17 of 46 positions shown; findings below may reference images not displayed]

FINDINGS: Bones/Joint/Cartilage

Mild superomedial left femoroacetabular joint space narrowing. Mild
superolateral left acetabular degenerative osteophytosis.

Moderate pubic symphysis joint space narrowing and peripheral
osteophytosis degenerative change.

Ligaments

Suboptimally assessed by CT.

Muscles and Tendons

Normal size and density.

Soft tissues

No left hip joint effusion.

Incidental note of mild air within the endometrial canal of the
cervix or lower uterine segment (axial series 5, image 26).
IMPRESSION: :
IMPRESSION: 1. Very mild left femoroacetabular osteoarthritis.
2. Moderate degenerative changes of the pubic symphysis.

## 2021-04-23 IMAGING — MR MR LUMBAR SPINE W/O CM
4 of 5 series · 18 of 48 positions shown · non-contrast
Comparison: MRI lumbar spine [DATE]

CLINICAL DATA: Low back pain, symptoms persist with > 6 wks
treatment

EXAM:
MRI LUMBAR SPINE WITHOUT CONTRAST
TECHNIQUE: Multiplanar, multisequence MR imaging of the lumbar spine was
performed. No intravenous contrast was administered.

[Series 3: T2 · sagittal · 4.0mm · 0.55mm/px · 6 of 16 slices shown (1 of 2)]
[im 1/16]
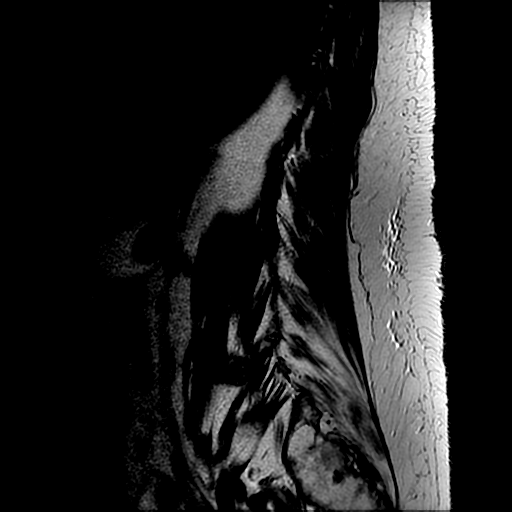
[im 4/16]
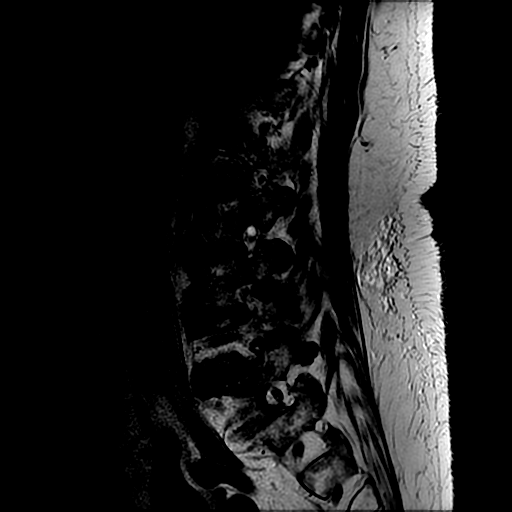
[im 7/16]
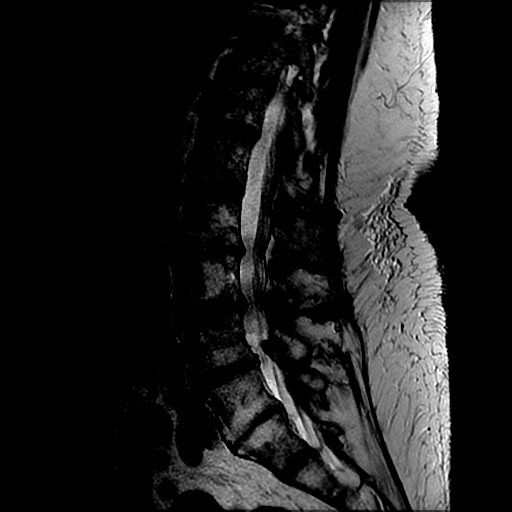
[im 10/16]
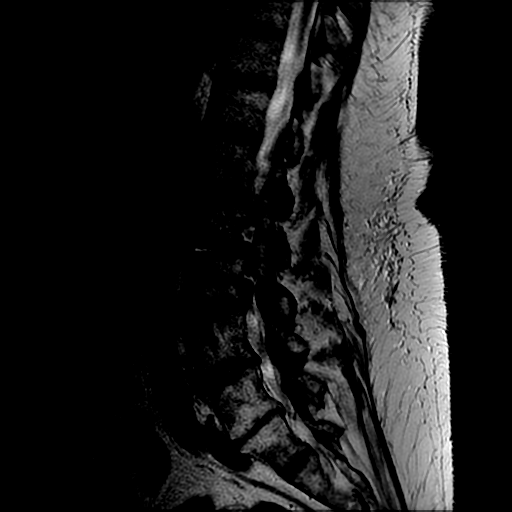
[im 13/16]
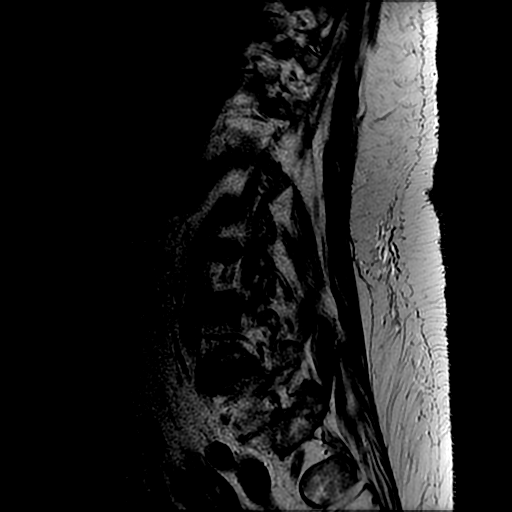
[im 16/16]
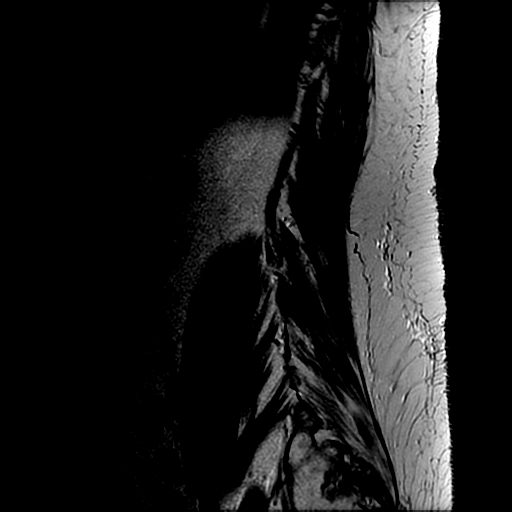

[Series 4: T1 · sagittal · 4.0mm · 0.55mm/px · 3 of 16 slices shown (1 of 2)]
[im 4/16]
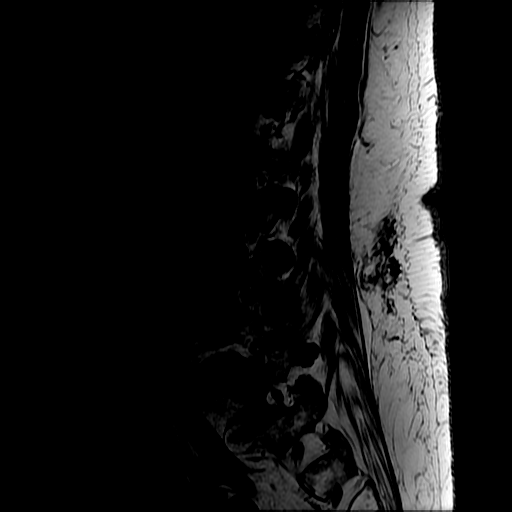
[im 10/16]
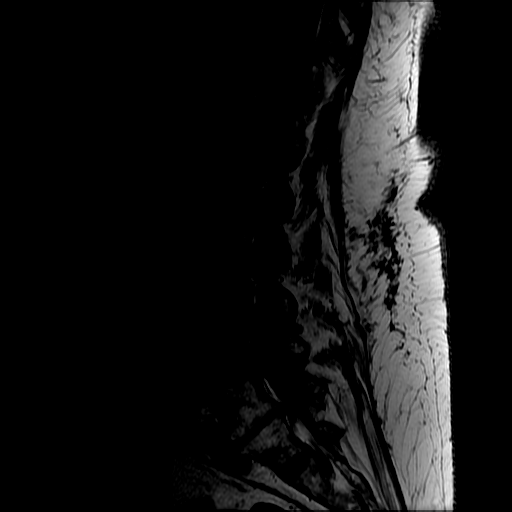
[im 16/16]
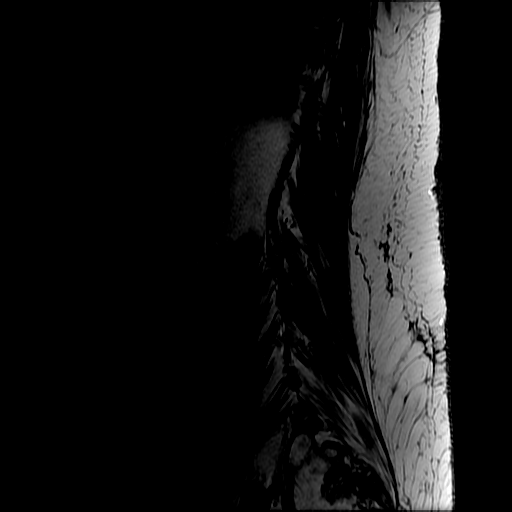

[Series 6: T2 · axial · 4.0mm · 0.39mm/px · z∈[-49,+125]mm · 6 of 37 slices shown (2 of 2)]
[im 1/37]
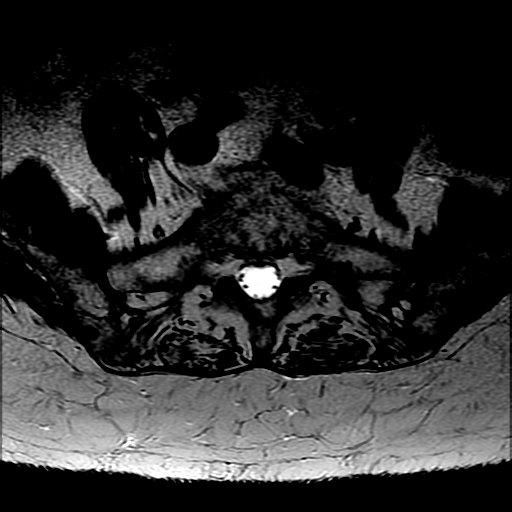
[im 6/37]
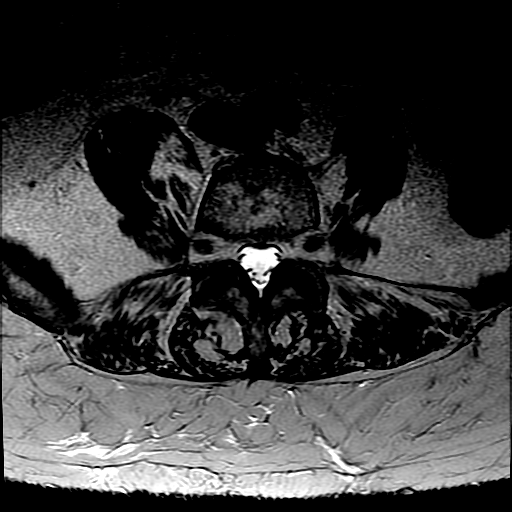
[im 11/37]
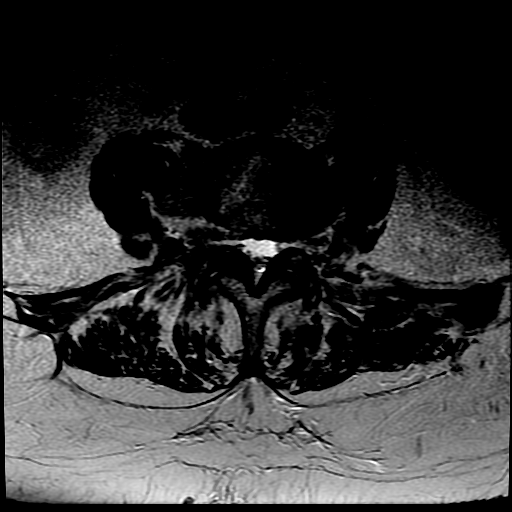
[im 16/37]
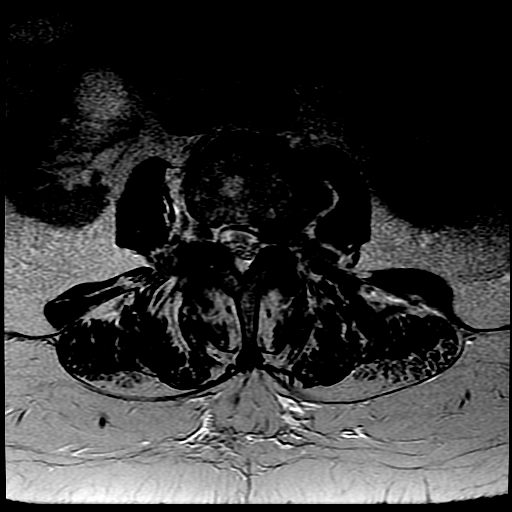
[im 19/37]
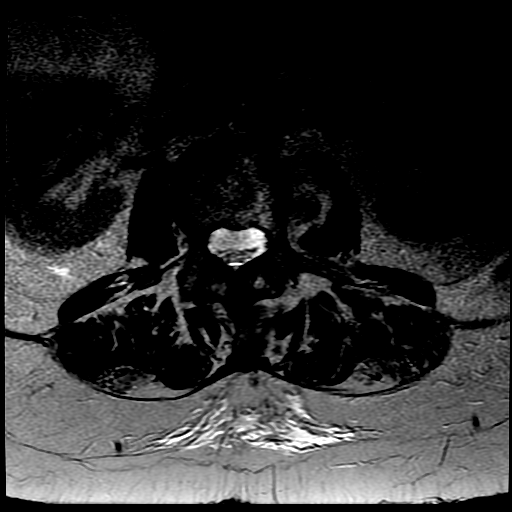
[im 31/37]
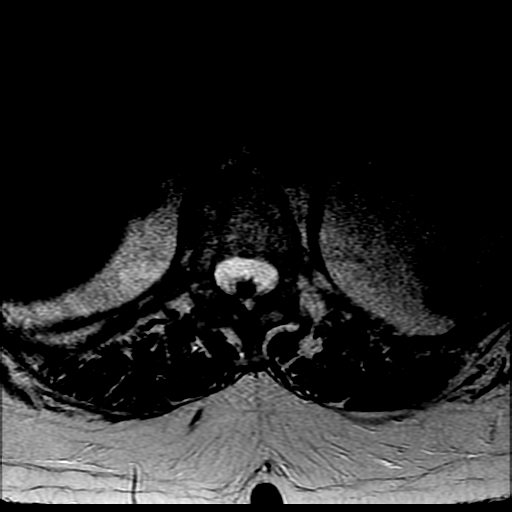

[Series 7: T1 · axial · 4.0mm · 0.39mm/px · z∈[-25,+125]mm · 3 of 37 slices shown (2 of 2)]
[im 6/37]
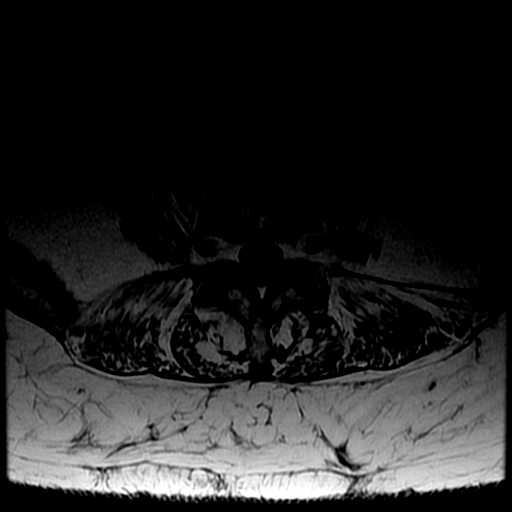
[im 19/37]
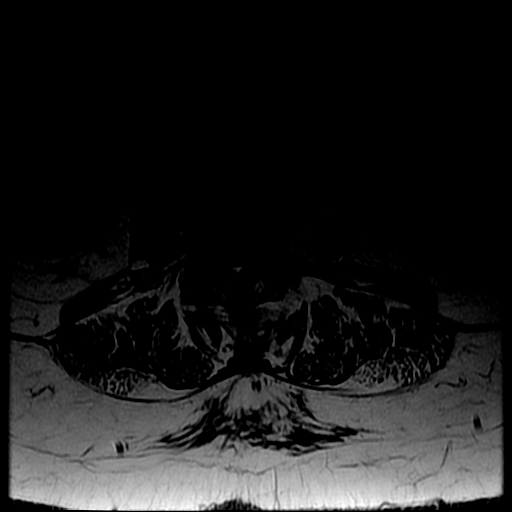
[im 31/37]
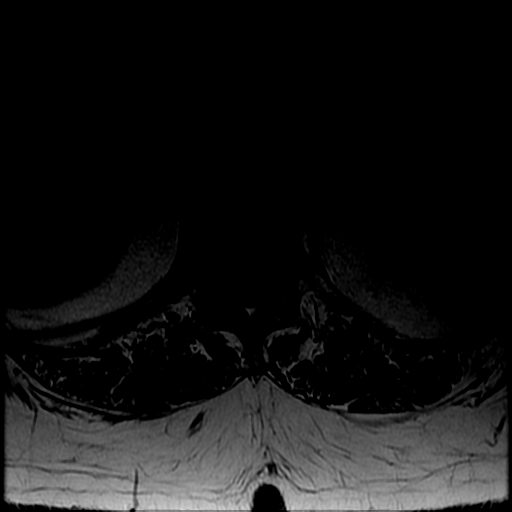

[18 of 48 positions shown; findings below may reference images not displayed]

FINDINGS: Segmentation: There is a transitional lumbosacral vertebrae with
partially lumbarized S1 and rudimentary disc at S1-S2. These are
numbered accordingly. Independent review of these levels should be
performed prior to any planned intervention.

Alignment:  Physiologic.

Vertebrae: No fracture, evidence of discitis, or aggressive bone
lesion. Multilevel degenerative endplate changes, most prominent at
L2-L3 on the left.

Conus medullaris and cauda equina: Conus extends to the L1-L2 level.
Conus and cauda equina appear normal.

Paraspinal and other soft tissues: Mild paraspinal muscle atrophy.
Simple right renal cyst. Cholelithiasis.

Disc levels:

T11-T12: No significant spinal canal or neural foraminal narrowing.

T12-L1: No significant spinal canal or neural foraminal narrowing.

L1-L2: No significant spinal canal or neural foraminal narrowing.

L2-L3: Severe left-sided disc height loss with broad-based disc
bulging endplate spurring. Bilateral facet arthropathy and
ligamentum flavum hypertrophy. Mild spinal canal stenosis. Moderate
left and mild right neural foraminal stenosis. Findings are
unchanged from recent MRI.

L3-L4: Broad-based disc bulging, ligamentum flavum hypertrophy and
right worse than left facet arthropathy result in mild-to-moderate
spinal canal stenosis. There is newly extruded disc material in the
left neural foramina with impingement of the exiting left L3 nerve
root. Mild right neural foraminal stenosis.

L4-L5: Severe right-sided disc height loss with endplate spurring,
mild disc bulging, and bilateral facet arthropathy. Unchanged mild
spinal canal stenosis. Unchanged moderate severe right-sided neural
foraminal stenosis, potentially impinging the exiting right L4 nerve
root. No left neural foraminal stenosis.

L5-S1: Severe disc height loss with endplate spurring and mild left
foraminal disc bulging. Bilateral facet arthropathy, right worse
than left. No significant spinal canal or neural foraminal stenosis.
IMPRESSION: Newly extruded disc material in the left neural foramina at L3-L4
with impingement of the exiting left L3 nerve root. Unchanged
overall mild to moderate spinal canal stenosis and mild right neural
foraminal stenosis at L3-L4.

Unchanged moderate to severe right-sided neural foraminal stenosis
at L4-L5, potentially impinging the exiting right L4 nerve root.
Unchanged mild spinal canal stenosis at L4-L5.

Unchanged mild spinal canal stenosis, moderate left and mild right
neural foraminal stenosis at L2-L3.

Cholelithiasis.

## 2021-04-23 MED ORDER — HYDROMORPHONE HCL 1 MG/ML IJ SOLN
0.5000 mg | Freq: Once | INTRAMUSCULAR | Status: AC
Start: 2021-04-23 — End: 2021-04-23
  Administered 2021-04-23: 0.5 mg via INTRAVENOUS
  Filled 2021-04-23: qty 1

## 2021-04-23 MED ORDER — ONDANSETRON HCL 4 MG/2ML IJ SOLN
4.0000 mg | Freq: Once | INTRAMUSCULAR | Status: AC
  Administered 2021-04-23: 4 mg via INTRAVENOUS
  Filled 2021-04-23: qty 2

## 2021-04-23 MED ORDER — HYDROCODONE-ACETAMINOPHEN 10-325 MG PO TABS: 0.5000 | ORAL_TABLET | Freq: Four times a day (QID) | ORAL | 0 refills | Status: DC | PRN

## 2021-04-23 MED ORDER — HYDROCODONE-ACETAMINOPHEN 5-325 MG PO TABS: 1.0000 | ORAL_TABLET | Freq: Four times a day (QID) | ORAL | 0 refills | Status: DC | PRN

## 2021-04-23 NOTE — ED Provider Notes (Addendum)
[facility] EMERGENCY DEPARTMENT Provider Note   CSN: [id number] Arrival date & time: 04/23/21  1051     History  Chief Complaint  Patient presents with   Back Pain    Valerie Bowman is a 86 y.o. female.  Patient from [facility].  Brought in by [employer].  Patient with a complaint of sudden onset lower back pain radiating to left anterior thigh started 2 days ago.  No fall or injury.  The patient is getting physical therapy there.  Patient was seen by her orthopedic doctor Dr. Cleophas Dunker January 10 with a complaint of shoulder pain and back pain.  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  Had an MRI of her lumbar area January 8.  That showed significant problems at multiple levels.  There was a discussion about steroid injections.  And for the shoulder pain which is not a main issue today there was discussion about MRI and may be steroid injection there.  Patient wanted to hold off and think about that.  It is implies that there was discussion about other therapies as well.  Daughter also relates that there is been some discoloration of the left foot.  Patient states that the pain is quite severe.  Burning sensation to the left anterior thigh.  Past medical history For COPD hypertension hyperlipidemia chronic diastolic heart failure.      Home Medications Prior to Admission medications   Medication Sig Start Date End Date Taking? Authorizing Provider  acetaminophen (TYLENOL) 500 MG tablet Take 500 mg by mouth every 6 (six) hours as needed for mild pain, fever or headache.    [provider]  albuterol (VENTOLIN HFA) 108 (90 Base) MCG/ACT inhaler Inhale 1 puff into the lungs every 6 (six) hours as needed for wheezing or shortness of breath. 03/01/21   Cipriano Bunker, MD  amLODipine (NORVASC) 5 MG tablet Take 5 mg by mouth daily.    [provider]  aspirin EC 81 MG tablet Take 81 mg by mouth daily. Swallow whole.    [provider]  enalapril  (VASOTEC) 20 MG tablet Take 20 mg by mouth 2 (two) times daily.    [provider]  levothyroxine (SYNTHROID) 125 MCG tablet Take 125 mcg by mouth daily at 6 (six) AM.    [provider]  metoprolol succinate (TOPROL-XL) 50 MG 24 hr tablet Take 50 mg by mouth daily. Take with or immediately following a meal.    [provider]  Multiple Vitamin (MULTIVITAMIN WITH MINERALS) TABS tablet Take 1 tablet by mouth daily.    [provider]  polyethylene glycol (MIRALAX / GLYCOLAX) 17 g packet Take 17 g by mouth daily.    [provider]  polyvinyl alcohol (LIQUIFILM TEARS) 1.4 % ophthalmic solution Place 1 drop into both eyes 2 (two) times daily.    [provider]  senna (SENOKOT) 8.6 MG TABS tablet Take 1 tablet by mouth 2 (two) times daily.    [provider]  Tiotropium Bromide-Olodaterol 2.5-2.5 MCG/ACT AERS Inhale 2 puffs into the lungs daily.    [provider]      Allergies    Penicillins and Sulfa antibiotics    Review of Systems   Review of Systems  Constitutional:  Negative for chills and fever.  HENT:  Negative for ear pain and sore throat.   Eyes:  Negative for pain and visual disturbance.  Respiratory:  Negative for cough and shortness of breath.  Cardiovascular:  Negative for chest pain and palpitations.  Gastrointestinal:  Negative for abdominal pain and vomiting.  Genitourinary:  Negative for dysuria and hematuria.  Musculoskeletal:  Positive for back pain. Negative for arthralgias.  Skin:  Negative for color change and rash.  Neurological:  Negative for seizures and syncope.  All other systems reviewed and are negative.  Physical Exam Updated Vital Signs BP (!) 100/57 (BP Location: Left Arm)    Pulse 75    Temp 97.8 F (36.6 C) (Oral)    Resp 18    SpO2 96%  Physical Exam Vitals and nursing note reviewed.  Constitutional:      General: She is not in acute distress.    Appearance: She is  well-developed.  HENT:     Head: Normocephalic and atraumatic.  Eyes:     Conjunctiva/sclera: Conjunctivae normal.  Cardiovascular:     Rate and Rhythm: Normal rate and regular rhythm.     Heart sounds: No murmur heard. Pulmonary:     Effort: Pulmonary effort is normal. No respiratory distress.     Breath sounds: Normal breath sounds.  Abdominal:     Palpations: Abdomen is soft.     Tenderness: There is no abdominal tenderness.  Musculoskeletal:        General: No swelling.     Cervical back: Neck supple.  Skin:    General: Skin is warm and dry.     Capillary Refill: Capillary refill takes less than 2 seconds.  Neurological:     Mental Status: She is alert.  Psychiatric:        Mood and Affect: Mood normal.    ED Results / Procedures / Treatments   Labs (all labs ordered are listed, but only abnormal results are displayed) Labs Reviewed  CBC  BASIC METABOLIC PANEL    EKG None  Radiology CT Hip Left Wo Contrast  Result Date: 04/23/2021 CLINICAL DATA:  Hip pain, chronic.  Osteoarthritis suspected. EXAM: CT OF THE LEFT HIP WITHOUT CONTRAST TECHNIQUE: Multidetector CT imaging of the left hip was performed according to the standard protocol. Multiplanar CT image reconstructions were also generated. RADIATION DOSE REDUCTION: This exam was performed according to the departmental dose-optimization program which includes automated exposure control, adjustment of the mA and/or kV according to patient size and/or use of iterative reconstruction technique. COMPARISON:  Pelvis and left hip radiographs 04/23/2021 AP pelvis 02/20/2021 FINDINGS: Bones/Joint/Cartilage Mild superomedial left femoroacetabular joint space narrowing. Mild superolateral left acetabular degenerative osteophytosis. Moderate pubic symphysis joint space narrowing and peripheral osteophytosis degenerative change. Ligaments Suboptimally assessed by CT. Muscles and Tendons Normal size and density. Soft tissues No left hip  joint effusion. Incidental note of mild air within the endometrial canal of the cervix or lower uterine segment (axial series 5, image 26). IMPRESSION:: IMPRESSION: 1. Very mild left femoroacetabular osteoarthritis. 2. Moderate degenerative changes of the pubic symphysis. Electronically Signed   By: Yvonne Kendall M.D.   On: 04/23/2021 14:08   VAS [name] ABI WITH/WO TBI  Result Date: 04/23/2021  LOWER EXTREMITY DOPPLER STUDY Patient Name:  LURANA KOERNER  Date of Exam:   04/23/2021 Medical Rec #: [id number]   Accession #:    [id number] Date of Birth: 1935/09/27    Patient Gender: F Patient Age:   89 years Exam Location:  [facility] Procedure:      VAS [name] ABI WITH/WO TBI Referring Phys: Andee Poles RAY --------------------------------------------------------------------------------  Indications: Claudication. High Risk Factors: Hypertension, past history of smoking. Other Factors: CHF.  Comparison Study: No previous exams Performing Technologist: Hill, Jody RVT, RDMS  Examination Guidelines: A complete evaluation includes at minimum, Doppler waveform signals and systolic blood pressure reading at the level of bilateral brachial, anterior tibial, and posterior tibial arteries, when vessel segments are accessible. Bilateral testing is considered an integral part of a complete examination. Photoelectric Plethysmograph (PPG) waveforms and toe systolic pressure readings are included as required and additional duplex testing as needed. Limited examinations for reoccurring indications may be performed as noted.  ABI Findings: +---------+------------------+-----+---------+--------+  Right     Rt Pressure (mmHg) Index Waveform  Comment   +---------+------------------+-----+---------+--------+  Brachial  150                      triphasic           +---------+------------------+-----+---------+--------+  PTA       157                1.05  triphasic           +---------+------------------+-----+---------+--------+  DP        144                 0.96  biphasic            +---------+------------------+-----+---------+--------+  Great Toe 93                 0.62  Abnormal            +---------+------------------+-----+---------+--------+ +---------+------------------+-----+---------+-------+  Left      Lt Pressure (mmHg) Index Waveform  Comment  +---------+------------------+-----+---------+-------+  Brachial  138                      triphasic          +---------+------------------+-----+---------+-------+  PTA       148                0.99  triphasic          +---------+------------------+-----+---------+-------+  DP        142                0.95  biphasic           +---------+------------------+-----+---------+-------+  Great Toe 76                 0.51  Abnormal           +---------+------------------+-----+---------+-------+ +-------+-----------+-----------+------------+------------+  ABI/TBI Today's ABI Today's TBI Previous ABI Previous TBI  +-------+-----------+-----------+------------+------------+  Right   1.05                                               +-------+-----------+-----------+------------+------------+  Left    0.99                                               +-------+-----------+-----------+------------+------------+   Summary: Right: Resting right ankle-brachial index is within normal range. No evidence of significant right lower extremity arterial disease. The right toe-brachial index is abnormal. Left: Resting left ankle-brachial index is within normal range. No evidence of significant left lower extremity arterial disease. The left toe-brachial index is abnormal.  *See table(s) above for measurements and observations.  Preliminary    DG Hip Unilat W or Wo Pelvis 2-3 Views Left  Result Date: 04/23/2021 CLINICAL DATA:  Sudden onset low back pain. EXAM: DG HIP (WITH OR WITHOUT PELVIS) 2-3V LEFT COMPARISON:  None. FINDINGS: No evidence for acute fracture involving the bony anatomy of the pelvis. SI joints and  symphysis pubis are unremarkable. Mild degenerative changes noted in both hips, right greater than left. AP and frogleg lateral views of the left hip show no evidence for proximal femur fracture. IMPRESSION: Negative. Electronically Signed   By: Misty Stanley M.D.   On: 04/23/2021 12:10   VAS [name] LOWER EXTREMITY VENOUS (DVT) (7a-7p)  Result Date: 04/23/2021  Lower Venous DVT Study Patient Name:  JAMIEKA BELFLOWER  Date of Exam:   04/23/2021 Medical Rec #: [id number]   Accession #:    [id number] Date of Birth: 1935-11-11    Patient Gender: F Patient Age:   64 years Exam Location:  [facility] Procedure:      VAS [name] LOWER EXTREMITY VENOUS (DVT) Referring Phys: Andee Poles RAY --------------------------------------------------------------------------------  Indications: Pain.  Comparison Study: No pr evious exams Performing Technologist: Jody Hill RVT, RDMS  Examination Guidelines: A complete evaluation includes B-mode imaging, spectral Doppler, color Doppler, and power Doppler as needed of all accessible portions of each vessel. Bilateral testing is considered an integral part of a complete examination. Limited examinations for reoccurring indications may be performed as noted. The reflux portion of the exam is performed with the patient in reverse Trendelenburg.  +-----+---------------+---------+-----------+----------+--------------+  RIGHT Compressibility Phasicity Spontaneity Properties Thrombus Aging  +-----+---------------+---------+-----------+----------+--------------+  CFV   Full            Yes       Yes                                    +-----+---------------+---------+-----------+----------+--------------+   +---------+---------------+---------+-----------+----------+--------------+  LEFT      Compressibility Phasicity Spontaneity Properties Thrombus Aging  +---------+---------------+---------+-----------+----------+--------------+  CFV       Full            Yes       Yes                                     +---------+---------------+---------+-----------+----------+--------------+  SFJ       Full                                                             +---------+---------------+---------+-----------+----------+--------------+  FV Prox   Full            Yes       Yes                                    +---------+---------------+---------+-----------+----------+--------------+  FV Mid    Full            Yes       Yes                                    +---------+---------------+---------+-----------+----------+--------------+  FV Distal Full            Yes       Yes                                    +---------+---------------+---------+-----------+----------+--------------+  PFV       Full                                                             +---------+---------------+---------+-----------+----------+--------------+  POP       Full            Yes       Yes                                    +---------+---------------+---------+-----------+----------+--------------+  PTV       Full                                                             +---------+---------------+---------+-----------+----------+--------------+  PERO      Full                                                             +---------+---------------+---------+-----------+----------+--------------+     Summary: RIGHT: - No evidence of common femoral vein obstruction.  LEFT: - There is no evidence of deep vein thrombosis in the lower extremity. - There is no evidence of superficial venous thrombosis.  - No cystic structure found in the popliteal fossa.  *See table(s) above for measurements and observations.    Preliminary     Procedures Procedures    Medications Ordered in ED Medications  HYDROmorphone (DILAUDID) injection 0.5 mg (0.5 mg Intravenous Given 04/23/21 1226)  ondansetron (ZOFRAN) injection 4 mg (4 mg Intravenous Given 04/23/21 1225)    ED Course/ Medical Decision Making/ A&P                           Medical Decision  Making Amount and/or Complexity of Data Reviewed Radiology: ordered.  Risk Prescription drug management.  Patient MRI from January with multiple levels of possible culprits to cause the pain.  Patient's main complaint since Friday has been kind of left hip left leg burning pain anterior thigh.  Says the pain will go all the way down the leg.  Not able to tell whether there is any numbness.  There is no significant weakness.  Patient does have baseline neuropathy in that foot.  There was some concern about vascular compromise so patient had ABIs done they were normal.  Patient's color and cap refill is normal to my exam.  X-rays of her left hip without any significant abnormalities.  Should get a CT of her hip.  Also had Doppler  studies of the left lower extremity without any evidence of any DVT.  CBC is back white count is normal.  Basic metabolic panel is pending.  Decided to go ahead and repeat the MRI of the back because of the significant pain.  Markedly improved with pain medicine here.  Patient could have aggravated pre-existing problems with the back due to the physical therapy that she was doing at [facility].  However patient had no direct fall or injury.  CT left hip without evidence of any fracture.  Has some mild osteoarthritic type changes.  But her symptoms really seem to be more radicular from the lumbar back area.  We will see what we get on MRI of the back.  MRI shows new impingement at L3-L4 nerve root.  This is new from previous MRI.  This would be responsible for the patient's pain.  Symptoms just got started on Friday.  Will discuss with family whether they want referral to neurosurgery.  Then in symptomatic pain control.  We will have patient follow-up with her orthopedist we will also give neurosurgery referral.  We will treat with hydrocodone.  Also discussed with patient's daughter.  Hopefully this will settle down on its own.  May require some steroid injections.   Hopefully will not get to the point where it requires a laminectomy.  Final Clinical Impression(s) / ED Diagnoses Final diagnoses:  Acute left-sided low back pain with left-sided sciatica    Rx / DC Orders ED Discharge Orders     None         Fredia Sorrow, MD 04/23/21 1420    Fredia Sorrow, MD 04/23/21 1521    Fredia Sorrow, MD 04/23/21 1551

## 2021-04-23 NOTE — ED Notes (Signed)
Patient transported to X-ray 

## 2021-04-23 NOTE — ED Triage Notes (Signed)
Patient BIB [facility] from [facility] for evaluation of sudden onset lower back pain radiating into left hip that started two days ago. Patient denies acute injury and states she is unsure what is causing the pain. Patient alert, oriented, and in no apparent distress at this time.

## 2021-04-23 NOTE — Progress Notes (Signed)
LLE venous duplex and ABI has been completed.  Preliminary results messaged to Dr. Rosalia Hammers via secure messenger.  Results can be found under chart review under CV PROC. 04/23/2021 1:45 PM Mulki Roesler RVT, RDMS

## 2021-04-23 NOTE — Discharge Instructions (Addendum)
Can appointment to follow-up with your orthopedist.  We definitely have shown by MRI that there is a herniated disc at L3-L4.  This is the cause of the pain to this on the left side.  Continue take pain medicine as needed.  Rest is much as possible.  Also given referral to neurosurgery.

## 2021-04-24 ENCOUNTER — Other Ambulatory Visit: Payer: Self-pay | Admitting: Physician Assistant

## 2021-04-24 ENCOUNTER — Other Ambulatory Visit: Payer: Self-pay | Admitting: Physical Medicine and Rehabilitation

## 2021-04-24 ENCOUNTER — Other Ambulatory Visit: Payer: Self-pay | Admitting: Radiology

## 2021-04-24 DIAGNOSIS — M545 Low back pain, unspecified: Secondary | ICD-10-CM

## 2021-04-24 DIAGNOSIS — M5416 Radiculopathy, lumbar region: Secondary | ICD-10-CM

## 2021-04-24 DIAGNOSIS — G8929 Other chronic pain: Secondary | ICD-10-CM

## 2021-04-25 ENCOUNTER — Ambulatory Visit: Payer: Self-pay

## 2021-04-25 ENCOUNTER — Encounter: Payer: Self-pay | Admitting: Physical Medicine and Rehabilitation

## 2021-04-25 ENCOUNTER — Ambulatory Visit (INDEPENDENT_AMBULATORY_CARE_PROVIDER_SITE_OTHER): Payer: Medicare Other | Admitting: Physical Medicine and Rehabilitation

## 2021-04-25 ENCOUNTER — Other Ambulatory Visit: Payer: Self-pay

## 2021-04-25 VITALS — BP 134/75 | HR 88

## 2021-04-25 DIAGNOSIS — M5416 Radiculopathy, lumbar region: Secondary | ICD-10-CM

## 2021-04-25 MED ORDER — METHYLPREDNISOLONE ACETATE 80 MG/ML IJ SUSP
80.0000 mg | Freq: Once | INTRAMUSCULAR | Status: AC
  Administered 2021-04-25: 80 mg

## 2021-04-25 NOTE — Patient Instructions (Signed)

## 2021-04-25 NOTE — Progress Notes (Signed)
Pt state lower back pain that travels to her left leg. Pt state standing makes the pain worse. Pt state she takes pain meds to help ease her pain.  Numeric Pain Rating Scale and Functional Assessment Average Pain 4   In the last MONTH (on 0-10 scale) has pain interfered with the following?  1. General activity like being  able to carry out your everyday physical activities such as walking, climbing stairs, carrying groceries, or moving a chair?  Rating(10)   +Driver, -BT, -Dye Allergies.

## 2021-04-30 ENCOUNTER — Other Ambulatory Visit: Payer: Self-pay

## 2021-04-30 ENCOUNTER — Encounter: Payer: Self-pay | Admitting: Physician Assistant

## 2021-04-30 ENCOUNTER — Ambulatory Visit (INDEPENDENT_AMBULATORY_CARE_PROVIDER_SITE_OTHER): Payer: Medicare Other | Admitting: Physician Assistant

## 2021-04-30 DIAGNOSIS — M25511 Pain in right shoulder: Secondary | ICD-10-CM | POA: Diagnosis not present

## 2021-04-30 MED ORDER — LIDOCAINE HCL 1 % IJ SOLN
5.0000 mL | INTRAMUSCULAR | Status: AC | PRN
  Administered 2021-04-30: 5 mL

## 2021-04-30 MED ORDER — METHYLPREDNISOLONE ACETATE 40 MG/ML IJ SUSP
80.0000 mg | INTRAMUSCULAR | Status: AC | PRN
  Administered 2021-04-30: 80 mg via INTRA_ARTICULAR

## 2021-04-30 NOTE — Progress Notes (Signed)
Office Visit Note   Patient: Valerie Bowman           Date of Birth: 04-20-1935           MRN: [id number] Visit Date: 04/30/2021              Requested by: Herschel Senegal, MD [address] [place],  [place] [address] PCP: Herschel Senegal, MD  Chief Complaint  Patient presents with   Right Shoulder - Follow-up      HPI: Patient is a pleasant 86 year old woman who follows up today for right shoulder pain.  She has a history of arthritis of the [facility] joint.  Her daughter had messaged with a question regarding trying a injection into her right shoulder.  She also has a history of lower back pain and was injected by Dr. Alvester Morin last week when her pain became acute.  Her daughter reports she is doing much better.  Today her pain is around the shoulder joint but also has some pain that radiates down into her biceps  Assessment & Plan: Visit Diagnoses: No diagnosis found.  Plan: Right shoulder pain we discussed trying an injection into the shoulder today.  Her daughter also inquired about an MRI.  I told her if we were thinking of an MRI we probably should delay the injection.  They decided she would try an injection today.  Follow-Up Instructions: No follow-ups on file.   Ortho Exam  Patient is alert, oriented, no adenopathy, well-dressed, normal affect, normal respiratory effort. Examination of her right shoulder no swelling no redness.  She does have pain after moving her shoulder around a little bit.  She also complains of some pain radiating down into her biceps pad.  She does have an intact biceps and biceps tendon.  She has some impingement findings.  Difficulty going behind her back because of pain  Imaging: No results found. No images are attached to the encounter.  Labs: No results found for: HGBA1C, ESRSEDRATE, CRP, LABURIC, REPTSTATUS, GRAMSTAIN, CULT, LABORGA   Lab Results  Component Value Date   ALBUMIN 3.6 02/28/2021    Lab Results  Component Value Date   MG 2.1 02/28/2021    No results found for: VD25OH  No results found for: PREALBUMIN CBC EXTENDED Latest Ref Rng & Units 04/23/2021 02/28/2021 02/27/2021  WBC 4.0 - 10.5 K/uL 7.5 7.8 9.3  RBC 3.87 - 5.11 MIL/uL 4.00 3.91 3.89  HGB 12.0 - 15.0 g/dL 47.0 96.2 83.6  HCT 62.9 - 46.0 % 38.7 37.6 37.7  PLT 150 - 400 K/uL 178 190 237  NEUTROABS 1.7 - 7.7 K/uL - 7.3 6.5  LYMPHSABS 0.7 - 4.0 K/uL - 0.4(L) 1.6     There is no height or weight on file to calculate BMI.  Orders:  No orders of the defined types were placed in this encounter.  No orders of the defined types were placed in this encounter.    Procedures: Large Joint Inj: R subacromial bursa on 04/30/2021 10:20 AM Indications: diagnostic evaluation and pain Details: 25 G 1.5 in needle, posterior approach  Arthrogram: No  Medications: 5 mL lidocaine 1 %; 80 mg methylPREDNISolone acetate 40 MG/ML Outcome: tolerated well, no immediate complications Procedure, treatment alternatives, risks and benefits explained, specific risks discussed. Consent was given by the patient.     Clinical Data: No additional findings.  ROS:  All other systems negative, except as noted in the HPI. Review of Systems  Objective: Vital Signs: There were no vitals taken  for this visit.  Specialty Comments:  No specialty comments available.  PMFS History: Patient Active Problem List   Diagnosis Date Noted   Pain in right shoulder 03/20/2021   SOB (shortness of breath) 02/28/2021   Acquired hypothyroidism    Essential hypertension    Chronic diastolic heart failure (HCC)    Acute exacerbation of chronic obstructive pulmonary disease (COPD) (HCC) 02/27/2021   Low back pain 02/20/2021   Past Medical History:  Diagnosis Date   Acquired hypothyroidism    Chronic diastolic heart failure (HCC)    COPD (chronic obstructive pulmonary disease) (HCC)    Essential hypertension    Hyperlipidemia    Urinary incontinence     History reviewed. No pertinent family  history.  History reviewed. No pertinent surgical history. Social History   Occupational History   Not on file  Tobacco Use   Smoking status: Former    Packs/day: 1.00    Years: 30.00    Pack years: 30.00    Types: Cigarettes    Quit date: 1995    Years since quitting: 28.1   Smokeless tobacco: Never  Substance and Sexual Activity   Alcohol use: Not Currently   Drug use: Never   Sexual activity: Not on file

## 2021-05-01 ENCOUNTER — Ambulatory Visit: Payer: Medicare Other | Admitting: Orthopaedic Surgery

## 2021-05-03 ENCOUNTER — Ambulatory Visit: Payer: Medicare Other | Admitting: Orthopaedic Surgery

## 2021-05-06 NOTE — Progress Notes (Signed)
Valerie Bowman - 86 y.o. female MRN [id number]  Date of birth: 08-Jun-1935  Office Visit Note: Visit Date: 04/25/2021 PCP: Marylynn Pearson, MD Referred by: Marylynn Pearson, MD  Subjective: Chief Complaint  Patient presents with   Lower Back - Pain   Left Leg - Pain   HPI:  Valerie Bowman is a 86 y.o. female who comes in today at the request of Bevely Palmer Persons, PA-C for planned Left L4-5 Lumbar Interlaminar epidural steroid injection with fluoroscopic guidance.  The patient has failed conservative care including home exercise, medications, time and activity modification.  This injection will be diagnostic and hopefully therapeutic.  Please see requesting physician notes for further details and justification. MRI reviewed with images and spine model.  MRI reviewed in the note below.   ROS Otherwise per HPI.  Assessment & Plan: Visit Diagnoses:    ICD-10-CM   1. Lumbar radiculopathy  M54.16 XR C-ARM NO REPORT    Epidural Steroid injection    methylPREDNISolone acetate (DEPO-MEDROL) injection 80 mg      Plan: No additional findings.   Meds & Orders:  Meds ordered this encounter  Medications   methylPREDNISolone acetate (DEPO-MEDROL) injection 80 mg    Orders Placed This Encounter  Procedures   XR C-ARM NO REPORT   Epidural Steroid injection    Follow-up: Return if symptoms worsen or fail to improve.   Procedures: No procedures performed  Lumbar Epidural Steroid Injection - Interlaminar Approach with Fluoroscopic Guidance  Patient: Valerie Bowman      Date of Birth: Jun 12, 1935 MRN: [id number] PCP: Marylynn Pearson, MD      Visit Date: 04/25/2021   Universal Protocol:     Consent Given By: the patient  Position: PRONE  Additional Comments: Vital signs were monitored before and after the procedure. Patient was prepped and draped in the usual sterile fashion. The correct patient, procedure, and site was verified.   Injection Procedure Details:   Procedure diagnoses: Lumbar  radiculopathy [M54.16]   Meds Administered:  Meds ordered this encounter  Medications   methylPREDNISolone acetate (DEPO-MEDROL) injection 80 mg     Laterality: Left  Location/Site:  L4-5  Needle: 3.5 in., 20 ga. Tuohy  Needle Placement: Paramedian epidural  Findings:   -Comments: Excellent flow of contrast into the epidural space.  Procedure Details: Using a paramedian approach from the side mentioned above, the region overlying the inferior lamina was localized under fluoroscopic visualization and the soft tissues overlying this structure were infiltrated with 4 ml. of 1% Lidocaine without Epinephrine. The Tuohy needle was inserted into the epidural space using a paramedian approach.   The epidural space was localized using loss of resistance along with counter oblique bi-planar fluoroscopic views.  After negative aspirate for air, blood, and CSF, a 2 ml. volume of Isovue-250 was injected into the epidural space and the flow of contrast was observed. Radiographs were obtained for documentation purposes.    The injectate was administered into the level noted above.   Additional Comments:  The patient tolerated the procedure well Dressing: 2 x 2 sterile gauze and Band-Aid    Post-procedure details: Patient was observed during the procedure. Post-procedure instructions were reviewed.  Patient left the clinic in stable condition.   Clinical History: MRI LUMBAR SPINE WITHOUT CONTRAST   TECHNIQUE: Multiplanar, multisequence MR imaging of the lumbar spine was performed. No intravenous contrast was administered.   COMPARISON:  None.   FINDINGS: Segmentation: There is a transitional lumbosacral vertebrae with partially lumbarized S1 and  rudimentary disc at S1-S2. These are numbered accordingly. Independent review of these levels should be performed prior to any planned intervention.   Alignment:  Physiologic.   Vertebrae: There is no evidence of acute fracture,  discitis, or aggressive osseous lesion. There are degenerative endplate changes, most prominent at L2-L3.   Conus medullaris and cauda equina: Conus extends to the L1-L2 level. Conus and cauda equina appear normal.   Paraspinal and other soft tissues: Mild paraspinal muscle atrophy. No other significant paraspinal abnormality.   Disc levels:   T11-T12: No significant spinal canal or neural foraminal narrowing.   T12-L1: No significant spinal canal or neural foraminal narrowing.   L1-L2: No significant spinal canal or neural foraminal narrowing.   L2-L3: Severe left-sided disc height loss with broad-based disc bulging and endplate spurring. Bilateral facet arthropathy and ligamentum flavum hypertrophy. There is mild spinal canal stenosis, moderate left and no significant right neural foraminal narrowing.   L3-L4: Broad-based disc bulging, ligamentum flavum hypertrophy, and bilateral facet arthropathy results in mild-to-moderate spinal canal stenosis, mild right and moderate left neural foraminal narrowing. There is potential impingement of the exiting left-sided nerve root (sagittal T2 image 12).   L4-L5: Severe right-sided disc height loss with degenerative endplate spurring and mild disc bulging. Ligamentum flavum hypertrophy and bilateral facet arthropathy right worse than left. Mild spinal canal stenosis. Moderate-severe right and no significant left neural foraminal narrowing. There is potentially impingement of the exiting right-sided nerve root.   L5-S1: Severe disc height loss with Modic type 2 degenerative endplate change, endplate spurring and minimal disc bulging. Mild bilateral facet arthropathy and ligamentum flavum hypertrophy. No significant spinal canal stenosis. No significant neural foraminal narrowing.   IMPRESSION: Multilevel degenerative changes of the lumbar spine, as summarized below:   L2-L3: Mild spinal canal stenosis. Moderate left neural  foraminal narrowing.   L3-L4: Mild-to-moderate spinal canal stenosis. Mild right and moderate left neural foraminal narrowing. Potential impingement of the exiting left-sided nerve root.   L4-L5: Mild spinal canal stenosis. Moderate-severe right neural foraminal narrowing with potential impingement of the exiting right-sided nerve root.   L5-S1: Severe degenerative disc disease without significant spinal canal or neural foraminal stenosis.   Transitional lumbosacral vertebrae is present. Independent review of these levels should be performed prior to any planned intervention.     Electronically Signed   By: Maurine Simmering M.D.   On: 03/18/2021 12:37     Objective:  VS:  HT:     WT:    BMI:      BP:134/75   HR:88bpm   TEMP: ( )   RESP:  Physical Exam Vitals and nursing note reviewed.  Constitutional:      General: She is not in acute distress.    Appearance: Normal appearance. She is not ill-appearing.  HENT:     Head: Normocephalic and atraumatic.     Right Ear: External ear normal.     Left Ear: External ear normal.  Eyes:     Extraocular Movements: Extraocular movements intact.  Cardiovascular:     Rate and Rhythm: Normal rate.     Pulses: Normal pulses.  Pulmonary:     Effort: Pulmonary effort is normal. No respiratory distress.  Abdominal:     General: There is no distension.     Palpations: Abdomen is soft.  Musculoskeletal:        General: Tenderness present.     Cervical back: Neck supple.     Right lower leg: No edema.  Left lower leg: No edema.     Comments: Patient has good distal strength with no pain over the greater trochanters.  No clonus or focal weakness.  Skin:    Findings: No erythema, lesion or rash.  Neurological:     General: No focal deficit present.     Mental Status: She is alert and oriented to person, place, and time.     Sensory: No sensory deficit.     Motor: No weakness or abnormal muscle tone.     Coordination: Coordination  normal.  Psychiatric:        Mood and Affect: Mood normal.        Behavior: Behavior normal.     Imaging: No results found.

## 2021-05-06 NOTE — Procedures (Signed)
Lumbar Epidural Steroid Injection - Interlaminar Approach with Fluoroscopic Guidance  Patient: Valerie Bowman      Date of Birth: [birth year] MRN: [id number] PCP: Herschel Senegal, MD      Visit Date: 04/25/2021   Universal Protocol:     Consent Given By: the patient  Position: PRONE  Additional Comments: Vital signs were monitored before and after the procedure. Patient was prepped and draped in the usual sterile fashion. The correct patient, procedure, and site was verified.   Injection Procedure Details:   Procedure diagnoses: Lumbar radiculopathy [M54.16]   Meds Administered:  Meds ordered this encounter  Medications   methylPREDNISolone acetate (DEPO-MEDROL) injection 80 mg     Laterality: Left  Location/Site:  L4-5  Needle: 3.5 in., 20 ga. Tuohy  Needle Placement: Paramedian epidural  Findings:   -Comments: Excellent flow of contrast into the epidural space.  Procedure Details: Using a paramedian approach from the side mentioned above, the region overlying the inferior lamina was localized under fluoroscopic visualization and the soft tissues overlying this structure were infiltrated with 4 ml. of 1% Lidocaine without Epinephrine. The Tuohy needle was inserted into the epidural space using a paramedian approach.   The epidural space was localized using loss of resistance along with counter oblique bi-planar fluoroscopic views.  After negative aspirate for air, blood, and CSF, a 2 ml. volume of Isovue-250 was injected into the epidural space and the flow of contrast was observed. Radiographs were obtained for documentation purposes.    The injectate was administered into the level noted above.   Additional Comments:  The patient tolerated the procedure well Dressing: 2 x 2 sterile gauze and Band-Aid    Post-procedure details: Patient was observed during the procedure. Post-procedure instructions were reviewed.  Patient left the clinic in stable condition.

## 2021-05-09 ENCOUNTER — Ambulatory Visit (INDEPENDENT_AMBULATORY_CARE_PROVIDER_SITE_OTHER): Payer: Medicare Other | Admitting: Physical Medicine and Rehabilitation

## 2021-05-09 ENCOUNTER — Encounter: Payer: Self-pay | Admitting: Physical Medicine and Rehabilitation

## 2021-05-09 ENCOUNTER — Other Ambulatory Visit: Payer: Self-pay

## 2021-05-09 VITALS — BP 101/65 | HR 72

## 2021-05-09 DIAGNOSIS — M47816 Spondylosis without myelopathy or radiculopathy, lumbar region: Secondary | ICD-10-CM

## 2021-05-09 DIAGNOSIS — M545 Low back pain, unspecified: Secondary | ICD-10-CM | POA: Diagnosis not present

## 2021-05-09 DIAGNOSIS — G8929 Other chronic pain: Secondary | ICD-10-CM

## 2021-05-09 NOTE — Progress Notes (Signed)
Valerie Bowman - 86 y.o. female MRN [id number]  Date of birth: December 26, 1935  Office Visit Note: Visit Date: 05/09/2021 PCP: Marylynn Pearson, MD Referred by: Marylynn Pearson, MD  Subjective: No chief complaint on file.  HPI: Valerie Bowman is a 86 y.o. female who comes in today for evaluation of chronic, worsening and severe bilateral lower back pain. Patient's daughter is accompanying her during our visit today, patient is a poor historian and frequently requires clarification and direction from her daughter. Daughter reports history of cognitive impairment. Patient states pain has been ongoing for several months. Patient reports pain is exacerbated by movement, activity and walking. Patient describes pain as a constant dull and sore sensation, currently rates as 8 out of 10. Patient reports some relief of pain with sitting/rest and use of medications as needed. Patient states she is taking Norco as needed for moderate to severe pain that was prescribed by Dr.Scott Zackowski during recent emergency department visit.  Patient's recent lumbar MRI exhibits newly extruded disc material on the left at L3-L4 with impingement of the exiting left L3 nerve root, there is also mild to moderate spinal canal stenosis noted at this level. There is unchanged moderate to severe right sided foraminal stenosis noted at L4-L5 potentially impinging the exiting right L4 nerve root. There is also bilateral facet arthropathy noted at L5-S1, right greater than left. No high-grade spinal canal stenosis noted. Patient reports she was recently evaluated in the emergency department for chronic back issues on 04/23/2021, negative CT of left hip, patient was discharged home with prescription for Norco and told to follow up with orthopedist.    Patient recently had left L4-L5 interlaminar epidural steroid injection performed in our office on 04/25/2021 and reports significant and sustained relief of left leg radicular symptoms, however bilateral  lower back pain remains. Patient states her severe pain is affecting her mobility and makes it difficult for her to accomplish daily tasks. Patient is currently using rolling walker to assist with ambulation and prevent falls. Patient denies focal weakness, numbness and tingling. Patient denies recent trauma or falls.   Patient's course is complicated by COPD, oxygen dependence (2L on nasal cannula), chronic diastolic heart failure, peripheral neuropathy and history of heavy ETOH use. Patient has been evaluated by Dr. Hulen Shouts with neurology at [facility] in [place], [place].  Review of Systems  Musculoskeletal:  Positive for back pain.  Neurological:  Negative for tingling, sensory change, focal weakness and weakness.  All other systems reviewed and are negative. Otherwise per HPI.  Assessment & Plan: Visit Diagnoses:    ICD-10-CM   1. Chronic bilateral low back pain without sciatica  M54.50 Ambulatory referral to Physical Medicine Rehab   G89.29     2. Spondylosis without myelopathy or radiculopathy, lumbar region  M47.816 Ambulatory referral to Physical Medicine Rehab    3. Facet hypertrophy of lumbar region  M47.816 Ambulatory referral to Physical Medicine Rehab       Plan: Findings:  Chronic worsening and severe bilateral lower back pain. Good pain relief of left leg radicular symptoms with recent left L4-L5 interlaminar epidural steroid injection. Patient continues to have severe bilateral lower back pain despite good conservative therapies such as rest, sitting and use of medications. Patients clinical presentation and exam are consistent with facet mediated pain. There is bilateral facet arthropathy noted at L5-S1, right greater than left. We believe the next step is to perform diagnostic and hopefully therapeutic bilateral L5-S1 facet joint blocks under fluoroscopic guidance. If  patient gets good pain relief with facet joint blocks we did discuss the possibility of longer  sustained pain relief with radiofrequency ablation. I did provide patient and daughter with educational material regarding radiofrequency ablation procedure to take home and review. If patients left leg radicular symptoms do return we would consider repeating lumbar epidural steroid injection. Patient instructed to continue using rolling walker to assist with ambulation and prevent falls. No red flag symptoms noted upon exam.    Meds & Orders: No orders of the defined types were placed in this encounter.   Orders Placed This Encounter  Procedures   Ambulatory referral to Physical Medicine Rehab    Follow-up: Return for Bilateral L5-S1 facet joint blocks.   Procedures: No procedures performed      Clinical History: EXAM: MRI LUMBAR SPINE WITHOUT CONTRAST   TECHNIQUE: Multiplanar, multisequence MR imaging of the lumbar spine was performed. No intravenous contrast was administered.   COMPARISON:  MRI lumbar spine 03/18/2021   FINDINGS: Segmentation: There is a transitional lumbosacral vertebrae with partially lumbarized S1 and rudimentary disc at S1-S2. These are numbered accordingly. Independent review of these levels should be performed prior to any planned intervention.   Alignment:  Physiologic.   Vertebrae: No fracture, evidence of discitis, or aggressive bone lesion. Multilevel degenerative endplate changes, most prominent at L2-L3 on the left.   Conus medullaris and cauda equina: Conus extends to the L1-L2 level. Conus and cauda equina appear normal.   Paraspinal and other soft tissues: Mild paraspinal muscle atrophy. Simple right renal cyst. Cholelithiasis.   Disc levels:   T11-T12: No significant spinal canal or neural foraminal narrowing.   T12-L1: No significant spinal canal or neural foraminal narrowing.   L1-L2: No significant spinal canal or neural foraminal narrowing.   L2-L3: Severe left-sided disc height loss with broad-based disc bulging endplate  spurring. Bilateral facet arthropathy and ligamentum flavum hypertrophy. Mild spinal canal stenosis. Moderate left and mild right neural foraminal stenosis. Findings are unchanged from recent MRI.   L3-L4: Broad-based disc bulging, ligamentum flavum hypertrophy and right worse than left facet arthropathy result in mild-to-moderate spinal canal stenosis. There is newly extruded disc material in the left neural foramina with impingement of the exiting left L3 nerve root. Mild right neural foraminal stenosis.   L4-L5: Severe right-sided disc height loss with endplate spurring, mild disc bulging, and bilateral facet arthropathy. Unchanged mild spinal canal stenosis. Unchanged moderate severe right-sided neural foraminal stenosis, potentially impinging the exiting right L4 nerve root. No left neural foraminal stenosis.   L5-S1: Severe disc height loss with endplate spurring and mild left foraminal disc bulging. Bilateral facet arthropathy, right worse than left. No significant spinal canal or neural foraminal stenosis.   IMPRESSION: Newly extruded disc material in the left neural foramina at L3-L4 with impingement of the exiting left L3 nerve root. Unchanged overall mild to moderate spinal canal stenosis and mild right neural foraminal stenosis at L3-L4.   Unchanged moderate to severe right-sided neural foraminal stenosis at L4-L5, potentially impinging the exiting right L4 nerve root. Unchanged mild spinal canal stenosis at L4-L5.   Unchanged mild spinal canal stenosis, moderate left and mild right neural foraminal stenosis at L2-L3.   Cholelithiasis.     Electronically Signed   By: Maurine Simmering M.D.   On: 04/23/2021 15:15   She reports that she quit smoking about 28 years ago. Her smoking use included cigarettes. She has a 30.00 pack-year smoking history. She has never used smokeless tobacco. No results  for input(s): HGBA1C, LABURIC in the last 8760 hours.  Objective:  VS:  HT:      WT:    BMI:      BP:101/65   HR:72bpm   TEMP: ( )   RESP:  Physical Exam Vitals and nursing note reviewed.  HENT:     Head: Normocephalic and atraumatic.     Right Ear: External ear normal.     Left Ear: External ear normal.     Nose: Nose normal.  Eyes:     Extraocular Movements: Extraocular movements intact.  Cardiovascular:     Rate and Rhythm: Normal rate.     Pulses: Normal pulses.  Pulmonary:     Effort: Pulmonary effort is normal.  Abdominal:     General: Abdomen is flat. There is no distension.  Musculoskeletal:        General: Tenderness present.     Cervical back: Normal range of motion.     Comments: Pt is slow to rise from seated position to standing. Concordant low back pain with facet loading, lumbar spine extension and rotation. Strong distal strength without clonus, no pain upon palpation of greater trochanters. Sensation intact bilaterally. Ambulates with walker, gait unsteady.  Skin:    General: Skin is warm and dry.     Capillary Refill: Capillary refill takes less than 2 seconds.  Neurological:     Mental Status: She is alert and oriented to person, place, and time.     Gait: Gait abnormal.  Psychiatric:        Mood and Affect: Mood normal.        Behavior: Behavior normal.    Ortho Exam  Imaging: No results found.  Past Medical/Family/Surgical/Social History: Medications & Allergies reviewed per EMR, new medications updated. Patient Active Problem List   Diagnosis Date Noted   Pain in right shoulder 03/20/2021   SOB (shortness of breath) 02/28/2021   Acquired hypothyroidism    Essential hypertension    Chronic diastolic heart failure (HCC)    Acute exacerbation of chronic obstructive pulmonary disease (COPD) ([place]) 02/27/2021   Low back pain 02/20/2021   Past Medical History:  Diagnosis Date   Acquired hypothyroidism    Chronic diastolic heart failure (HCC)    COPD (chronic obstructive pulmonary disease) ([place])    Essential hypertension     Hyperlipidemia    Urinary incontinence    History reviewed. No pertinent family history. History reviewed. No pertinent surgical history. Social History   Occupational History   Not on file  Tobacco Use   Smoking status: Former    Packs/day: 1.00    Years: 30.00    Pack years: 30.00    Types: Cigarettes    Quit date: 1995    Years since quitting: 28.1   Smokeless tobacco: Never  Substance and Sexual Activity   Alcohol use: Not Currently   Drug use: Never   Sexual activity: Not on file

## 2021-05-14 ENCOUNTER — Other Ambulatory Visit: Payer: Self-pay

## 2021-05-14 ENCOUNTER — Ambulatory Visit (INDEPENDENT_AMBULATORY_CARE_PROVIDER_SITE_OTHER): Payer: Medicare Other | Admitting: Physical Medicine and Rehabilitation

## 2021-05-14 ENCOUNTER — Ambulatory Visit: Payer: Self-pay

## 2021-05-14 ENCOUNTER — Encounter: Payer: Self-pay | Admitting: Physical Medicine and Rehabilitation

## 2021-05-14 VITALS — BP 95/59 | HR 71

## 2021-05-14 DIAGNOSIS — M47816 Spondylosis without myelopathy or radiculopathy, lumbar region: Secondary | ICD-10-CM

## 2021-05-14 MED ORDER — BUPIVACAINE HCL 0.5 % IJ SOLN
3.0000 mL | Freq: Once | INTRAMUSCULAR | Status: AC
  Administered 2021-05-14: 3 mL

## 2021-05-14 NOTE — Progress Notes (Signed)
Valerie Bowman - 86 y.o. female MRN [id number]  Date of birth: 1935/06/08  Office Visit Note: Visit Date: 05/14/2021 PCP: Marylynn Pearson, MD Referred by: Marylynn Pearson, MD  Subjective: Chief Complaint  Patient presents with   Lower Back - Pain   HPI:  Valerie Bowman is a 86 y.o. female who comes in today at the request of Barnet Pall, FNP for planned Bilateral  L5-S1 Lumbar facet/medial branch block with fluoroscopic guidance.  The patient has failed conservative care including home exercise, medications, time and activity modification.  This injection will be diagnostic and hopefully therapeutic.  Please see requesting physician notes for further details and justification.  Exam has shown concordant pain with facet joint loading.  ROS Otherwise per HPI.  Assessment & Plan: Visit Diagnoses:    ICD-10-CM   1. Spondylosis without myelopathy or radiculopathy, lumbar region  M47.816 XR C-ARM NO REPORT    Facet Injection    bupivacaine (MARCAINE) 0.5 % (with pres) injection 3 mL      Plan: No additional findings.   Meds & Orders:  Meds ordered this encounter  Medications   bupivacaine (MARCAINE) 0.5 % (with pres) injection 3 mL    Orders Placed This Encounter  Procedures   Facet Injection   XR C-ARM NO REPORT    Follow-up: Return for Review Pain Diary.   Procedures: No procedures performed  Lumbar Diagnostic Facet Joint Nerve Block with Fluoroscopic Guidance   Patient: Valerie Bowman      Date of Birth: 07-10-1935 MRN: [id number] PCP: Marylynn Pearson, MD      Visit Date: 05/14/2021   Universal Protocol:    Date/Time: 05/15/2310:50 PM  Consent Given By: the patient  Position: PRONE  Additional Comments: Vital signs were monitored before and after the procedure. Patient was prepped and draped in the usual sterile fashion. The correct patient, procedure, and site was verified.   Injection Procedure Details:   Procedure diagnoses:  1. Spondylosis without myelopathy or  radiculopathy, lumbar region      Meds Administered:  Meds ordered this encounter  Medications   bupivacaine (MARCAINE) 0.5 % (with pres) injection 3 mL     Laterality: Bilateral  Location/Site: L5-S1, L4 medial branch and L5 dorsal ramus  Needle: 5.0 in., 25 ga.  Short bevel or Quincke spinal needle  Needle Placement: Oblique pedical  Findings:   -Comments: There was excellent flow of contrast along the articular pillars without intravascular flow.  Procedure Details: The fluoroscope beam is vertically oriented in AP and then obliqued 15 to 20 degrees to the ipsilateral side of the desired nerve to achieve the Scotty dog appearance.  The skin over the target area of the junction of the superior articulating process and the transverse process (sacral ala if blocking the L5 dorsal rami) was locally anesthetized with a 1 ml volume of 1% Lidocaine without Epinephrine.  The spinal needle was inserted and advanced in a trajectory view down to the target.   After contact with periosteum and negative aspirate for blood and CSF, correct placement without intravascular or epidural spread was confirmed by injecting 0.5 ml. of Isovue-250.  A spot radiograph was obtained of this image.    Next, a 0.5 ml. volume of the injectate described above was injected. The needle was then redirected to the other facet joint nerves mentioned above if needed.  Prior to the procedure, the patient was given a Pain Diary which was completed for baseline measurements.  After the procedure, the patient rated  their pain every 30 minutes and will continue rating at this frequency for a total of 5 hours.  The patient has been asked to complete the Diary and return to [name] by mail, fax or hand delivered as soon as possible.   Additional Comments:  The patient tolerated the procedure well Dressing: 2 x 2 sterile gauze and Band-Aid    Post-procedure details: Patient was observed during the procedure. Post-procedure  instructions were reviewed.  Patient left the clinic in stable condition.   Clinical History: EXAM: MRI LUMBAR SPINE WITHOUT CONTRAST   TECHNIQUE: Multiplanar, multisequence MR imaging of the lumbar spine was performed. No intravenous contrast was administered.   COMPARISON:  MRI lumbar spine 03/18/2021   FINDINGS: Segmentation: There is a transitional lumbosacral vertebrae with partially lumbarized S1 and rudimentary disc at S1-S2. These are numbered accordingly. Independent review of these levels should be performed prior to any planned intervention.   Alignment:  Physiologic.   Vertebrae: No fracture, evidence of discitis, or aggressive bone lesion. Multilevel degenerative endplate changes, most prominent at L2-L3 on the left.   Conus medullaris and cauda equina: Conus extends to the L1-L2 level. Conus and cauda equina appear normal.   Paraspinal and other soft tissues: Mild paraspinal muscle atrophy. Simple right renal cyst. Cholelithiasis.   Disc levels:   T11-T12: No significant spinal canal or neural foraminal narrowing.   T12-L1: No significant spinal canal or neural foraminal narrowing.   L1-L2: No significant spinal canal or neural foraminal narrowing.   L2-L3: Severe left-sided disc height loss with broad-based disc bulging endplate spurring. Bilateral facet arthropathy and ligamentum flavum hypertrophy. Mild spinal canal stenosis. Moderate left and mild right neural foraminal stenosis. Findings are unchanged from recent MRI.   L3-L4: Broad-based disc bulging, ligamentum flavum hypertrophy and right worse than left facet arthropathy result in mild-to-moderate spinal canal stenosis. There is newly extruded disc material in the left neural foramina with impingement of the exiting left L3 nerve root. Mild right neural foraminal stenosis.   L4-L5: Severe right-sided disc height loss with endplate spurring, mild disc bulging, and bilateral facet arthropathy.  Unchanged mild spinal canal stenosis. Unchanged moderate severe right-sided neural foraminal stenosis, potentially impinging the exiting right L4 nerve root. No left neural foraminal stenosis.   L5-S1: Severe disc height loss with endplate spurring and mild left foraminal disc bulging. Bilateral facet arthropathy, right worse than left. No significant spinal canal or neural foraminal stenosis.   IMPRESSION: Newly extruded disc material in the left neural foramina at L3-L4 with impingement of the exiting left L3 nerve root. Unchanged overall mild to moderate spinal canal stenosis and mild right neural foraminal stenosis at L3-L4.   Unchanged moderate to severe right-sided neural foraminal stenosis at L4-L5, potentially impinging the exiting right L4 nerve root. Unchanged mild spinal canal stenosis at L4-L5.   Unchanged mild spinal canal stenosis, moderate left and mild right neural foraminal stenosis at L2-L3.   Cholelithiasis.     Electronically Signed   By: Maurine Simmering M.D.   On: 04/23/2021 15:15     Objective:  VS:  HT:     WT:    BMI:      BP:(!) 95/59   HR:71bpm   TEMP: ( )   RESP:  Physical Exam Vitals and nursing note reviewed.  Constitutional:      General: She is not in acute distress.    Appearance: Normal appearance. She is not ill-appearing.  HENT:     Head: Normocephalic and atraumatic.  Right Ear: External ear normal.     Left Ear: External ear normal.  Eyes:     Extraocular Movements: Extraocular movements intact.  Cardiovascular:     Rate and Rhythm: Normal rate.     Pulses: Normal pulses.  Pulmonary:     Effort: Pulmonary effort is normal. No respiratory distress.  Abdominal:     General: There is no distension.     Palpations: Abdomen is soft.  Musculoskeletal:        General: Tenderness present.     Cervical back: Neck supple.     Right lower leg: No edema.     Left lower leg: No edema.     Comments: Patient has good distal strength with no  pain over the greater trochanters.  No clonus or focal weakness.  Skin:    Findings: No erythema, lesion or rash.  Neurological:     General: No focal deficit present.     Mental Status: She is alert and oriented to person, place, and time.     Sensory: No sensory deficit.     Motor: No weakness or abnormal muscle tone.     Coordination: Coordination normal.  Psychiatric:        Mood and Affect: Mood normal.        Behavior: Behavior normal.     Imaging: XR C-ARM NO REPORT  Result Date: 05/14/2021 Please see Notes tab for imaging impression.

## 2021-05-14 NOTE — Progress Notes (Signed)
Pt state lower back pain. Pt state standing makes the pain worse. Pt state she takes pain meds to help ease her pain. ? ?Numeric Pain Rating Scale and Functional Assessment ?Average Pain 2 ? ? ?In the last MONTH (on 0-10 scale) has pain interfered with the following? ? ?1. General activity like being  able to carry out your everyday physical activities such as walking, climbing stairs, carrying groceries, or moving a chair?  ?Rating(10) ? ? ?+Driver, -BT, -Dye Allergies. ? ?

## 2021-05-14 NOTE — Patient Instructions (Signed)

## 2021-05-14 NOTE — Procedures (Signed)
Lumbar Diagnostic Facet Joint Nerve Block with Fluoroscopic Guidance  ? ?Patient: Valerie Bowman      ?Date of Birth: [birth year] ?MRN: [id number] ?PCP: Herschel Senegal, MD      ?Visit Date: 05/14/2021 ?  ?Universal Protocol:    ?Date/Time: 05/15/2310:50 PM ? ?Consent Given By: the patient ? ?Position: PRONE ? ?Additional Comments: ?Vital signs were monitored before and after the procedure. ?Patient was prepped and draped in the usual sterile fashion. ?The correct patient, procedure, and site was verified. ? ? ?Injection Procedure Details:  ? ?Procedure diagnoses:  ?1. Spondylosis without myelopathy or radiculopathy, lumbar region   ?  ? ?Meds Administered:  ?Meds ordered this encounter  ?Medications  ? bupivacaine (MARCAINE) 0.5 % (with pres) injection 3 mL  ?  ? ?Laterality: Bilateral ? ?Location/Site: L5-S1, L4 medial branch and L5 dorsal ramus ? ?Needle: 5.0 in., 25 ga.  Short bevel or Quincke spinal needle ? ?Needle Placement: Oblique pedical ? ?Findings: ? ? -Comments: There was excellent flow of contrast along the articular pillars without intravascular flow. ? ?Procedure Details: ?The fluoroscope beam is vertically oriented in AP and then obliqued 15 to 20 degrees to the ipsilateral side of the desired nerve to achieve the ?Scotty dog? appearance.  The skin over the target area of the junction of the superior articulating process and the transverse process (sacral ala if blocking the L5 dorsal rami) was locally anesthetized with a 1 ml volume of 1% Lidocaine without Epinephrine.  The spinal needle was inserted and advanced in a trajectory view down to the target.  ? ?After contact with periosteum and negative aspirate for blood and CSF, correct placement without intravascular or epidural spread was confirmed by injecting 0.5 ml. of Isovue-250.  A spot radiograph was obtained of this image.   ? ?Next, a 0.5 ml. volume of the injectate described above was injected. The needle was then redirected to the other facet  joint nerves mentioned above if needed. ? ?Prior to the procedure, the patient was given a Pain Diary which was completed for baseline measurements.  After the procedure, the patient rated their pain every 30 minutes and will continue rating at this frequency for a total of 5 hours.  The patient has been asked to complete the Diary and return to [name] by mail, fax or hand delivered as soon as possible. ? ? ?Additional Comments:  ?The patient tolerated the procedure well ?Dressing: 2 x 2 sterile gauze and Band-Aid ?  ? ?Post-procedure details: ?Patient was observed during the procedure. ?Post-procedure instructions were reviewed. ? ?Patient left the clinic in stable condition. ?

## 2021-05-16 ENCOUNTER — Encounter: Payer: Self-pay | Admitting: Physical Medicine and Rehabilitation

## 2021-05-16 ENCOUNTER — Other Ambulatory Visit: Payer: Self-pay | Admitting: Physical Medicine and Rehabilitation

## 2021-05-16 DIAGNOSIS — M47816 Spondylosis without myelopathy or radiculopathy, lumbar region: Secondary | ICD-10-CM

## 2021-05-16 DIAGNOSIS — G8929 Other chronic pain: Secondary | ICD-10-CM

## 2021-05-22 ENCOUNTER — Encounter: Payer: Self-pay | Admitting: Orthopaedic Surgery

## 2021-05-22 ENCOUNTER — Other Ambulatory Visit: Payer: Self-pay

## 2021-05-22 ENCOUNTER — Ambulatory Visit (INDEPENDENT_AMBULATORY_CARE_PROVIDER_SITE_OTHER): Payer: Medicare Other | Admitting: Orthopaedic Surgery

## 2021-05-22 DIAGNOSIS — M47819 Spondylosis without myelopathy or radiculopathy, site unspecified: Secondary | ICD-10-CM | POA: Diagnosis not present

## 2021-05-22 NOTE — Progress Notes (Signed)
? ?Office Visit Note ?  ?Patient: Valerie Bowman           ?Date of Birth: 09-04-1935           ?MRN: [id number] ?Visit Date: 05/22/2021 ?             ?Requested by: Herschel Senegal, MD ?[phone] ?[place],  [place] [address] ?PCP: Herschel Senegal, MD ? ? ?Assessment & Plan: ?Visit Diagnoses: Low back pain ? ?Plan: Patient is here with her daughter today.  We have injected her shoulder and she still continues to have good relief.  She did have an interlaminal injection with Dr. Alvester Morin which helped with her radicular symptoms that were acute and radiating down her legs.  She continues to have lower back pain secondary to her advanced facet arthropathy.  She did undergo L5-S1 facet injections.  She did get 50% relief.  The back pain has returned.  It is no radicular findings.  Did speak with Dr. Alvester Morin and Aundra Millet.  They had already contacted her to have a second set of facet injections.  If she gets 50% relief with these even if not longstanding they could go forward with ablation.  They will contact her to schedule the appointment may follow-up with [name] as needed ?Follow-Up Instructions: No follow-ups on file.  ? ?Orders:  ?No orders of the defined types were placed in this encounter. ? ?No orders of the defined types were placed in this encounter. ? ? ? ? Procedures: ?No procedures performed ? ? ?Clinical Data: ?No additional findings. ? ? ?Subjective: ?Chief Complaint  ?Patient presents with  ? Lower Back - Pain  ?Patient presents today for follow up on her lower back. She received a facet injection with Dr.Newton one week ago. She said that the injection did not help. She is taking Tylenol for pain relief, and occasionally a hydrocodone if needed. She is staying at [facility]. She uses a walker to help with ambulation.  ? ? ? ?Review of Systems  ?All other systems reviewed and are negative. ? ? ?Objective: ?Vital Signs: There were no vitals taken for this visit. ? ?Physical Exam ?Constitutional:   ?   Appearance: Normal  appearance.  ?Neurological:  ?   Mental Status: She is alert.  ? ? ?Ortho Exam ?Examination of her lower back: No radicular findings no straight leg raise.  She has good strength with dorsiflexion plantarflexion.  She has focal tenderness over the lower back no crepitus or step-off no erythema ?Specialty Comments:  ?No specialty comments available. ? ?Imaging: ?No results found. ? ? ?PMFS History: ?Patient Active Problem List  ? Diagnosis Date Noted  ? Pain in right shoulder 03/20/2021  ? SOB (shortness of breath) 02/28/2021  ? Acquired hypothyroidism   ? Essential hypertension   ? Chronic diastolic heart failure (HCC)   ? Acute exacerbation of chronic obstructive pulmonary disease (COPD) (HCC) 02/27/2021  ? Low back pain 02/20/2021  ? ?Past Medical History:  ?Diagnosis Date  ? Acquired hypothyroidism   ? Chronic diastolic heart failure (HCC)   ? COPD (chronic obstructive pulmonary disease) (HCC)   ? Essential hypertension   ? Hyperlipidemia   ? Urinary incontinence   ?  ?No family history on file.  ?No past surgical history on file. ?Social History  ? ?Occupational History  ? Not on file  ?Tobacco Use  ? Smoking status: Former  ?  Packs/day: 1.00  ?  Years: 30.00  ?  Pack years: 30.00  ?  Types: Cigarettes  ?  Quit date: 1995  ?  Years since quitting: 28.2  ? Smokeless tobacco: Never  ?Substance and Sexual Activity  ? Alcohol use: Not Currently  ? Drug use: Never  ? Sexual activity: Not on file  ? ? ? ? ? ? ?

## 2021-06-14 ENCOUNTER — Ambulatory Visit: Payer: Medicare Other

## 2021-06-14 ENCOUNTER — Ambulatory Visit (INDEPENDENT_AMBULATORY_CARE_PROVIDER_SITE_OTHER): Payer: Medicare Other | Admitting: Physical Medicine and Rehabilitation

## 2021-06-14 ENCOUNTER — Encounter: Payer: Self-pay | Admitting: Physical Medicine and Rehabilitation

## 2021-06-14 DIAGNOSIS — M47816 Spondylosis without myelopathy or radiculopathy, lumbar region: Secondary | ICD-10-CM | POA: Diagnosis not present

## 2021-06-14 MED ORDER — METHYLPREDNISOLONE ACETATE 80 MG/ML IJ SUSP: 80.0000 mg | Freq: Once | INTRAMUSCULAR | Status: DC

## 2021-06-14 NOTE — Patient Instructions (Signed)

## 2021-06-14 NOTE — Progress Notes (Signed)
Pt state lower back pain. Pt state standing makes the pain worse. Pt state she takes pain meds to help ease her pain. ? ?Numeric Pain Rating Scale and Functional Assessment ?Average Pain 2 ? ? ?In the last MONTH (on 0-10 scale) has pain interfered with the following? ? ?1. General activity like being  able to carry out your everyday physical activities such as walking, climbing stairs, carrying groceries, or moving a chair?  ?Rating(9) ? ? ?+Driver, -BT, -Dye Allergies. ? ?

## 2021-06-18 ENCOUNTER — Encounter: Payer: Self-pay | Admitting: Physical Medicine and Rehabilitation

## 2021-06-26 ENCOUNTER — Encounter: Payer: Self-pay | Admitting: Physical Medicine and Rehabilitation

## 2021-06-26 ENCOUNTER — Ambulatory Visit (INDEPENDENT_AMBULATORY_CARE_PROVIDER_SITE_OTHER): Payer: Medicare Other | Admitting: Physical Medicine and Rehabilitation

## 2021-06-26 DIAGNOSIS — R269 Unspecified abnormalities of gait and mobility: Secondary | ICD-10-CM | POA: Diagnosis not present

## 2021-06-26 DIAGNOSIS — M5416 Radiculopathy, lumbar region: Secondary | ICD-10-CM

## 2021-06-26 DIAGNOSIS — M47816 Spondylosis without myelopathy or radiculopathy, lumbar region: Secondary | ICD-10-CM | POA: Diagnosis not present

## 2021-06-26 DIAGNOSIS — M48062 Spinal stenosis, lumbar region with neurogenic claudication: Secondary | ICD-10-CM | POA: Diagnosis not present

## 2021-06-26 NOTE — Progress Notes (Signed)
? ?Valerie Bowman - 86 y.o. female MRN [id number]  Date of birth: [birth year] ? ?Office Visit Note: ?Visit Date: 06/26/2021 ?PCP: Herschel Senegal, MD ?Referred by: Herschel Senegal, MD ? ?Subjective: ?Chief Complaint  ?Patient presents with  ? Lower Back - Pain  ? Left Leg - Pain  ? ?HPI: Valerie Bowman is a 86 y.o. female who comes in today for evaluation of chronic, worsening and severe bilateral lower back pain with intermittent radiation to both legs.  Patient is somewhat of a poor historian and has difficulty answering questions.  Patients daughter accompanies her during our visit today.  Patient states pain has been ongoing for several months and is exacerbated by prolonged standing, walking and activity. She describes her pain as a constant sore, aching and tingling sensation, currently rates as 8 out of 10. Patient reports some relief with home exercise regimen, rest and use of over the counter medications as needed. Daughter reports she did take Norco recently, however she had to stop taking due to hallucinations. Patient is currently attending physical therapy several times a week and reports some relief of pain with these treatments. Patient's recent lumbar MRI exhibits newly extruded disc material on the left at L3-L4 with impingement of the exiting left L3 nerve root, there is also mild to moderate spinal canal stenosis noted at this level. There is unchanged moderate to severe right sided foraminal stenosis noted at L4-L5 potentially impinging the exiting right L4 nerve root. There is also bilateral facet arthropathy noted at L5-S1, right greater than left. Patient has had several injections performed in our office, most recent was bilateral L5-S1 facet joint/medial branch blocks. Patient reports greater than 50% relief with first set of diagnostic facet blocks and minimal relief with second set. She also had left L4-L5 interlaminar epidural steroid injection back in February of this year that did help some, however  pain relief was short lived. Patents daughter reports increased pain over the last week, patient states she is having trouble with mobility due to severe discomfort. Patient currently resides at [facility], she does use rolling walker to assist with ambulation and prevent falls. Patient is also oxygen dependent, wears 2L O2 via nasal cannula. Patient denies focal weakness. Patient denies recent trauma or falls.  ? ? ?Review of Systems  ?Musculoskeletal:  Positive for back pain.  ?Neurological:  Positive for tingling. Negative for sensory change, focal weakness and weakness.  ?All other systems reviewed and are negative. Otherwise per HPI. ? ?Assessment & Plan: ?Visit Diagnoses:  ?  ICD-10-CM   ?1. Lumbar radiculopathy  M54.16 Ambulatory referral to Physical Medicine Rehab  ?  ?2. Spinal stenosis of lumbar region with neurogenic claudication  M48.062 Ambulatory referral to Physical Medicine Rehab  ?  ?3. Facet hypertrophy of lumbar region  M47.816 Ambulatory referral to Physical Medicine Rehab  ?  ?4. Gait disturbance  R26.9 Ambulatory referral to Physical Medicine Rehab  ?  ?   ?Plan: Findings:  ?Chronic, worsening and severe bilateral lower back pain with intermittent radiation to bilateral legs.  Patient continues to have severe pain despite good conservative therapy such as formal physical therapy, home exercise regimen, rest and use of medications.  Patient's clinical presentation and exam are consistent with neurogenic claudication as a result of spinal canal stenosis. We believe the next step is to perform a diagnostic and hopefully therapeutic bilateral L3 transforaminal epidural steroid injection under fluoroscopic guidance. I did speak with patient about medication management today, however she does not wish  to try any new medications at this time. If patient gets good and lasting pain relief with lumbar epidural steroid injection we can look at repeating infrequently. Patient encouraged to remain  active as tolerated. We feel that we can get patient in  quickly for injection. No red flag symptoms noted upon exam today.   ? ?Meds & Orders: No orders of the defined types were placed in this encounter. ?  ?Orders Placed This Encounter  ?Procedures  ? Ambulatory referral to Physical Medicine Rehab  ?  ?Follow-up: Return for Bilateral L3 transforaminal epidural steroid injection.  ? ?Procedures: ?No procedures performed  ?   ? ?Clinical History: ?No specialty comments available.  ? ?She reports that she quit smoking about 28 years ago. Her smoking use included cigarettes. She has a 30.00 pack-year smoking history. She has never used smokeless tobacco. No results for input(s): HGBA1C, LABURIC in the last 8760 hours. ? ?Objective:  VS:  HT:    WT:   BMI:     BP:   HR: bpm  TEMP: ( )  RESP:  ?Physical Exam ?Vitals and nursing note reviewed.  ?HENT:  ?   Head: Normocephalic and atraumatic.  ?   Right Ear: External ear normal.  ?   Left Ear: External ear normal.  ?   Nose: Nose normal.  ?   Mouth/Throat:  ?   Mouth: Mucous membranes are moist.  ?Eyes:  ?   Extraocular Movements: Extraocular movements intact.  ?Cardiovascular:  ?   Rate and Rhythm: Normal rate.  ?   Pulses: Normal pulses.  ?Pulmonary:  ?   Effort: Pulmonary effort is normal.  ?Abdominal:  ?   General: Abdomen is flat. There is no distension.  ?Musculoskeletal:     ?   General: Tenderness present.  ?   Cervical back: Normal range of motion.  ?   Comments: Pt is slow to rise from seated position to standing. Good lumbar range of motion. Strong distal strength without clonus, no pain upon palpation of greater trochanters. Sensation intact bilaterally. Ambulates with rolling walker, gait slow and unsteady.  ?Skin: ?   General: Skin is warm and dry.  ?   Capillary Refill: Capillary refill takes less than 2 seconds.  ?Neurological:  ?   Mental Status: She is alert and oriented to person, place, and time.  ?   Gait: Gait abnormal.  ?Psychiatric:     ?    Mood and Affect: Mood normal.     ?   Behavior: Behavior normal.  ?  ?Ortho Exam ? ?Imaging: ?No results found. ? ?Past Medical/Family/Surgical/Social History: ?Medications & Allergies reviewed per EMR, new medications updated. ?Patient Active Problem List  ? Diagnosis Date Noted  ? Pain in right shoulder 03/20/2021  ? SOB (shortness of breath) 02/28/2021  ? Acquired hypothyroidism   ? Essential hypertension   ? Chronic diastolic heart failure (HCC)   ? Acute exacerbation of chronic obstructive pulmonary disease (COPD) (HCC) 02/27/2021  ? Low back pain 02/20/2021  ? ?Past Medical History:  ?Diagnosis Date  ? Acquired hypothyroidism   ? Chronic diastolic heart failure (HCC)   ? COPD (chronic obstructive pulmonary disease) (HCC)   ? Essential hypertension   ? Hyperlipidemia   ? Urinary incontinence   ? ?History reviewed. No pertinent family history. ?History reviewed. No pertinent surgical history. ?Social History  ? ?Occupational History  ? Not on file  ?Tobacco Use  ? Smoking status: Former  ?  Packs/day: 1.00  ?  Years: 30.00  ?  Pack years: 30.00  ?  Types: Cigarettes  ?  Quit date: 1995  ?  Years since quitting: 28.3  ? Smokeless tobacco: Never  ?Substance and Sexual Activity  ? Alcohol use: Not Currently  ? Drug use: Never  ? Sexual activity: Not on file  ? ? ?

## 2021-06-26 NOTE — Progress Notes (Signed)
Pt has hx of inj on 06/14/21 pt state it didn't help. Pt state lower back pain that travels to her left leg just a little. Pt state standing makes the pain worse. Pt state she takes pain meds to help ease her pain. ? ?Numeric Pain Rating Scale and Functional Assessment ?Average Pain 9 ?Pain Right Now 3 ?My pain is intermittent, sharp, dull, and aching ?Pain is worse with: walking, sitting, standing, and some activites ?Pain improves with: rest, medication, and injections ? ? ?In the last MONTH (on 0-10 scale) has pain interfered with the following? ? ?1. General activity like being  able to carry out your everyday physical activities such as walking, climbing stairs, carrying groceries, or moving a chair?  ?Rating(5) ? ?2. Relation with others like being able to carry out your usual social activities and roles such as  activities at home, at work and in your community. ?Rating(6) ? ?3. Enjoyment of life such that you have  been bothered by emotional problems such as feeling anxious, depressed or irritable?  ?Rating(7) ? ?

## 2021-06-27 ENCOUNTER — Ambulatory Visit (INDEPENDENT_AMBULATORY_CARE_PROVIDER_SITE_OTHER): Payer: Medicare Other | Admitting: Physical Medicine and Rehabilitation

## 2021-06-27 ENCOUNTER — Encounter: Payer: Self-pay | Admitting: Physical Medicine and Rehabilitation

## 2021-06-27 ENCOUNTER — Ambulatory Visit: Payer: Self-pay

## 2021-06-27 DIAGNOSIS — M5416 Radiculopathy, lumbar region: Secondary | ICD-10-CM

## 2021-06-27 MED ORDER — METHYLPREDNISOLONE ACETATE 80 MG/ML IJ SUSP
80.0000 mg | Freq: Once | INTRAMUSCULAR | Status: AC
  Administered 2021-06-27: 80 mg

## 2021-06-27 NOTE — Patient Instructions (Signed)

## 2021-06-27 NOTE — Progress Notes (Signed)
Pt state lower back pain that travels to her left leg just a little. Pt state standing makes the pain worse. Pt state she takes pain meds to help ease her pain. ? ?Numeric Pain Rating Scale and Functional Assessment ?Average Pain 5 ? ? ?In the last MONTH (on 0-10 scale) has pain interfered with the following? ? ?1. General activity like being  able to carry out your everyday physical activities such as walking, climbing stairs, carrying groceries, or moving a chair?  ?Rating(9) ? ? ?+Driver, -BT, -Dye Allergies. ? ? ?

## 2021-06-29 ENCOUNTER — Telehealth: Payer: Self-pay | Admitting: Physical Medicine and Rehabilitation

## 2021-06-29 NOTE — Telephone Encounter (Signed)
Patient called. She missed a call from [place]. Would like a call back. [phone] ?

## 2021-07-03 ENCOUNTER — Telehealth: Payer: Self-pay | Admitting: Physical Medicine and Rehabilitation

## 2021-07-03 NOTE — Telephone Encounter (Signed)
Patient's daughter [name] called needing a call back to schedule an appointment for her mother. The number to contact [name] is [phone] ?

## 2021-07-03 NOTE — Procedures (Signed)
Lumbar Diagnostic Facet Joint Nerve Block with Fluoroscopic Guidance  ? ?Patient: Valerie Bowman      ?Date of Birth: [birth year] ?MRN: [id number] ?PCP: Herschel Senegal, MD      ?Visit Date: 06/14/2021 ?  ?Universal Protocol:    ?Date/Time: 07/03/2308:59 AM ? ?Consent Given By: the patient ? ?Position: PRONE ? ?Additional Comments: ?Vital signs were monitored before and after the procedure. ?Patient was prepped and draped in the usual sterile fashion. ?The correct patient, procedure, and site was verified. ? ? ?Injection Procedure Details:  ? ?Procedure diagnoses:  ?1. Spondylosis without myelopathy or radiculopathy, lumbar region   ?  ? ?Meds Administered:  ?Meds ordered this encounter  ?Medications  ? methylPREDNISolone acetate (DEPO-MEDROL) injection 80 mg  ?  ? ?Laterality: Bilateral ? ?Location/Site: L5-S1, L4 medial branch and L5 dorsal ramus ? ?Needle: 5.0 in., 25 ga.  Short bevel or Quincke spinal needle ? ?Needle Placement: Oblique pedical ? ?Findings: ? ? -Comments: There was excellent flow of contrast along the articular pillars without intravascular flow. ? ?Procedure Details: ?The fluoroscope beam is vertically oriented in AP and then obliqued 15 to 20 degrees to the ipsilateral side of the desired nerve to achieve the ?Scotty dog? appearance.  The skin over the target area of the junction of the superior articulating process and the transverse process (sacral ala if blocking the L5 dorsal rami) was locally anesthetized with a 1 ml volume of 1% Lidocaine without Epinephrine.  The spinal needle was inserted and advanced in a trajectory view down to the target.  ? ?After contact with periosteum and negative aspirate for blood and CSF, correct placement without intravascular or epidural spread was confirmed by injecting 0.5 ml. of Isovue-250.  A spot radiograph was obtained of this image.   ? ?Next, a 0.5 ml. volume of the injectate described above was injected. The needle was then redirected to the other facet  joint nerves mentioned above if needed. ? ?Prior to the procedure, the patient was given a Pain Diary which was completed for baseline measurements.  After the procedure, the patient rated their pain every 30 minutes and will continue rating at this frequency for a total of 5 hours.  The patient has been asked to complete the Diary and return to [name] by mail, fax or hand delivered as soon as possible. ? ? ?Additional Comments:  ?The patient tolerated the procedure well ?Dressing: 2 x 2 sterile gauze and Band-Aid ?  ? ?Post-procedure details: ?Patient was observed during the procedure. ?Post-procedure instructions were reviewed. ? ?Patient left the clinic in stable condition.  ?

## 2021-07-03 NOTE — Progress Notes (Signed)
? ?  Valerie Bowman - 86 y.o. female MRN [id number]  Date of birth: 05/01/1935 ? ?Office Visit Note: ?Visit Date: 06/14/2021 ?PCP: Marylynn Pearson, MD ?Referred by: Marylynn Pearson, MD ? ?Subjective: ?Chief Complaint  ?Patient presents with  ? Lower Back - Pain  ? ?HPI:  Valerie Bowman is a 86 y.o. female who comes in today for planned repeat Bilateral L5-S1 Lumbar facet/medial branch block with fluoroscopic guidance.  The patient has failed conservative care including home exercise, medications, time and activity modification.  This injection will be diagnostic and hopefully therapeutic.  Please see requesting physician notes for further details and justification.  Exam shows concordant low back pain with facet joint loading and extension. Patient received more than 80% pain relief from prior injection. This would be the second block in a diagnostic double block paradigm. ? ? ? ? ?Referring:Megan Jimmye Norman, FNP ? ?ROS Otherwise per HPI. ? ?Assessment & Plan: ?Visit Diagnoses:  ?  ICD-10-CM   ?1. Spondylosis without myelopathy or radiculopathy, lumbar region  M47.816 XR C-ARM NO REPORT  ?  Facet Injection  ?  methylPREDNISolone acetate (DEPO-MEDROL) injection 80 mg  ?  ?  ?Plan: No additional findings.  ? ?Meds & Orders:  ?Meds ordered this encounter  ?Medications  ? methylPREDNISolone acetate (DEPO-MEDROL) injection 80 mg  ?  ?Orders Placed This Encounter  ?Procedures  ? Facet Injection  ? XR C-ARM NO REPORT  ?  ?Follow-up: Return for Review Pain Diary.  ? ?Procedures: ?No procedures performed  ?   ? ?Clinical History: ?No specialty comments available.  ? ? ? ?Objective:  VS:  HT:    WT:   BMI:     BP:   HR: bpm  TEMP: ( )  RESP:  ?Physical Exam  ? ?Imaging: ?No results found. ?

## 2021-07-09 NOTE — Progress Notes (Signed)
? ?Valerie Bowman - 86 y.o. female MRN [id number]  Date of birth: 01-17-1936 ? ?Office Visit Note: ?Visit Date: 06/27/2021 ?PCP: Marylynn Pearson, MD ?Referred by: Marylynn Pearson, MD ? ?Subjective: ?Chief Complaint  ?Patient presents with  ? Lower Back - Pain  ? Left Leg - Pain  ? ?HPI:  Valerie Bowman is a 86 y.o. female who comes in today at the request of Barnet Pall, FNP for planned Bilateral L3-4 Lumbar Transforaminal epidural steroid injection with fluoroscopic guidance.  The patient has failed conservative care including home exercise, medications, time and activity modification.  This injection will be diagnostic and hopefully therapeutic.  Please see requesting physician notes for further details and justification. ? ?ROS Otherwise per HPI. ? ?Assessment & Plan: ?Visit Diagnoses:  ?  ICD-10-CM   ?1. Lumbar radiculopathy  M54.16 XR C-ARM NO REPORT  ?  Epidural Steroid injection  ?  methylPREDNISolone acetate (DEPO-MEDROL) injection 80 mg  ?  ?  ?Plan: No additional findings.  ? ?Meds & Orders:  ?Meds ordered this encounter  ?Medications  ? methylPREDNISolone acetate (DEPO-MEDROL) injection 80 mg  ?  ?Orders Placed This Encounter  ?Procedures  ? XR C-ARM NO REPORT  ? Epidural Steroid injection  ?  ?Follow-up: Return if symptoms worsen or fail to improve.  ? ?Procedures: ?No procedures performed  ?Lumbosacral Transforaminal Epidural Steroid Injection - Sub-Pedicular Approach with Fluoroscopic Guidance ? ?Patient: Valerie Bowman      ?Date of Birth: December 07, 1935 ?MRN: [id number] ?PCP: Marylynn Pearson, MD      ?Visit Date: 06/27/2021 ?  ?Universal Protocol:    ?Date/Time: 06/27/2021 ? ?Consent Given By: the patient ? ?Position: PRONE ? ?Additional Comments: ?Vital signs were monitored before and after the procedure. ?Patient was prepped and draped in the usual sterile fashion. ?The correct patient, procedure, and site was verified. ? ? ?Injection Procedure Details:  ? ?Procedure diagnoses: Lumbar radiculopathy [M54.16]   ? ?Meds  Administered:  ?Meds ordered this encounter  ?Medications  ? methylPREDNISolone acetate (DEPO-MEDROL) injection 80 mg  ? ? ?Laterality: Bilateral ? ?Location/Site: L3 ? ?Needle:5.0 in., 22 ga.  Short bevel or Quincke spinal needle ? ?Needle Placement: Transforaminal ? ?Findings: ?  ? -Comments: Excellent flow of contrast along the nerve, nerve root and into the epidural space. Excellent on the left but very stenotic on the right. ? ?Procedure Details: ?After squaring off the end-plates to get a true AP view, the C-arm was positioned so that an oblique view of the foramen as noted above was visualized. The target area is just inferior to the "nose of the scotty dog" or sub pedicular. The soft tissues overlying this structure were infiltrated with 2-3 ml. of 1% Lidocaine without Epinephrine. ? ?The spinal needle was inserted toward the target using a "trajectory" view along the fluoroscope beam.  Under AP and lateral visualization, the needle was advanced so it did not puncture dura and was located close the 6 O'Clock position of the pedical in AP tracterory. Biplanar projections were used to confirm position. Aspiration was confirmed to be negative for CSF and/or blood. A 1-2 ml. volume of Isovue-250 was injected and flow of contrast was noted at each level. Radiographs were obtained for documentation purposes.  ? ?After attaining the desired flow of contrast documented above, a 0.5 to 1.0 ml test dose of 0.25% Marcaine was injected into each respective transforaminal space.  The patient was observed for 90 seconds post injection.  After no sensory deficits were reported, and normal lower extremity  motor function was noted,   the above injectate was administered so that equal amounts of the injectate were placed at each foramen (level) into the transforaminal epidural space. ? ? ?Additional Comments:  ?The patient tolerated the procedure well ?Dressing: 2 x 2 sterile gauze and Band-Aid ?  ? ?Post-procedure  details: ?Patient was observed during the procedure. ?Post-procedure instructions were reviewed. ? ?Patient left the clinic in stable condition. ?  ? ?Clinical History: ?No specialty comments available.  ? ? ? ?Objective:  VS:  HT:    WT:   BMI:     BP:   HR: bpm  TEMP: ( )  RESP:  ?Physical Exam ?Vitals and nursing note reviewed.  ?Constitutional:   ?   General: She is not in acute distress. ?   Appearance: Normal appearance. She is not ill-appearing.  ?HENT:  ?   Head: Normocephalic and atraumatic.  ?   Right Ear: External ear normal.  ?   Left Ear: External ear normal.  ?Eyes:  ?   Extraocular Movements: Extraocular movements intact.  ?Cardiovascular:  ?   Rate and Rhythm: Normal rate.  ?   Pulses: Normal pulses.  ?Pulmonary:  ?   Effort: Pulmonary effort is normal. No respiratory distress.  ?Abdominal:  ?   General: There is no distension.  ?   Palpations: Abdomen is soft.  ?Musculoskeletal:     ?   General: Tenderness present.  ?   Cervical back: Neck supple.  ?   Right lower leg: No edema.  ?   Left lower leg: No edema.  ?   Comments: Patient has good distal strength with no pain over the greater trochanters.  No clonus or focal weakness.  ?Skin: ?   Findings: No erythema, lesion or rash.  ?Neurological:  ?   General: No focal deficit present.  ?   Mental Status: She is alert and oriented to person, place, and time.  ?   Sensory: No sensory deficit.  ?   Motor: No weakness or abnormal muscle tone.  ?   Coordination: Coordination normal.  ?Psychiatric:     ?   Mood and Affect: Mood normal.     ?   Behavior: Behavior normal.  ?  ? ?Imaging: ?No results found. ?

## 2021-07-09 NOTE — Procedures (Signed)
Lumbosacral Transforaminal Epidural Steroid Injection - Sub-Pedicular Approach with Fluoroscopic Guidance ? ?Patient: Valerie Bowman      ?Date of Birth: [birth year] ?MRN: [id number] ?PCP: Herschel Senegal, MD      ?Visit Date: 06/27/2021 ?  ?Universal Protocol:    ?Date/Time: 06/27/2021 ? ?Consent Given By: the patient ? ?Position: PRONE ? ?Additional Comments: ?Vital signs were monitored before and after the procedure. ?Patient was prepped and draped in the usual sterile fashion. ?The correct patient, procedure, and site was verified. ? ? ?Injection Procedure Details:  ? ?Procedure diagnoses: Lumbar radiculopathy [M54.16]   ? ?Meds Administered:  ?Meds ordered this encounter  ?Medications  ? methylPREDNISolone acetate (DEPO-MEDROL) injection 80 mg  ? ? ?Laterality: Bilateral ? ?Location/Site: L3 ? ?Needle:5.0 in., 22 ga.  Short bevel or Quincke spinal needle ? ?Needle Placement: Transforaminal ? ?Findings: ?  ? -Comments: Excellent flow of contrast along the nerve, nerve root and into the epidural space. Excellent on the left but very stenotic on the right. ? ?Procedure Details: ?After squaring off the end-plates to get a true AP view, the C-arm was positioned so that an oblique view of the foramen as noted above was visualized. The target area is just inferior to the "nose of the scotty dog" or sub pedicular. The soft tissues overlying this structure were infiltrated with 2-3 ml. of 1% Lidocaine without Epinephrine. ? ?The spinal needle was inserted toward the target using a "trajectory" view along the fluoroscope beam.  Under AP and lateral visualization, the needle was advanced so it did not puncture dura and was located close the 6 O'Clock position of the pedical in AP tracterory. Biplanar projections were used to confirm position. Aspiration was confirmed to be negative for CSF and/or blood. A 1-2 ml. volume of Isovue-250 was injected and flow of contrast was noted at each level. Radiographs were obtained for  documentation purposes.  ? ?After attaining the desired flow of contrast documented above, a 0.5 to 1.0 ml test dose of 0.25% Marcaine was injected into each respective transforaminal space.  The patient was observed for 90 seconds post injection.  After no sensory deficits were reported, and normal lower extremity motor function was noted,   the above injectate was administered so that equal amounts of the injectate were placed at each foramen (level) into the transforaminal epidural space. ? ? ?Additional Comments:  ?The patient tolerated the procedure well ?Dressing: 2 x 2 sterile gauze and Band-Aid ?  ? ?Post-procedure details: ?Patient was observed during the procedure. ?Post-procedure instructions were reviewed. ? ?Patient left the clinic in stable condition. ? ?

## 2021-08-23 ENCOUNTER — Emergency Department (HOSPITAL_COMMUNITY): Payer: Medicare Other

## 2021-08-23 ENCOUNTER — Other Ambulatory Visit: Payer: Self-pay

## 2021-08-23 ENCOUNTER — Inpatient Hospital Stay (HOSPITAL_COMMUNITY): Payer: Medicare Other

## 2021-08-23 ENCOUNTER — Inpatient Hospital Stay (HOSPITAL_COMMUNITY)
Admission: EM | Admit: 2021-08-23 | Discharge: 2021-08-26 | DRG: 091 | Disposition: A | Discharge status: Nursing Home (Includes ICF) | Payer: Medicare Other | Source: Skilled Nursing Facility | Attending: Internal Medicine | Admitting: Internal Medicine

## 2021-08-23 ENCOUNTER — Encounter (HOSPITAL_COMMUNITY): Payer: Self-pay | Admitting: Internal Medicine

## 2021-08-23 DIAGNOSIS — Z8701 Personal history of pneumonia (recurrent): Secondary | ICD-10-CM

## 2021-08-23 DIAGNOSIS — Z9181 History of falling: Secondary | ICD-10-CM

## 2021-08-23 DIAGNOSIS — R32 Unspecified urinary incontinence: Secondary | ICD-10-CM | POA: Diagnosis present

## 2021-08-23 DIAGNOSIS — J449 Chronic obstructive pulmonary disease, unspecified: Secondary | ICD-10-CM | POA: Diagnosis present

## 2021-08-23 DIAGNOSIS — R4182 Altered mental status, unspecified: Secondary | ICD-10-CM

## 2021-08-23 DIAGNOSIS — E785 Hyperlipidemia, unspecified: Secondary | ICD-10-CM | POA: Diagnosis present

## 2021-08-23 DIAGNOSIS — I5032 Chronic diastolic (congestive) heart failure: Secondary | ICD-10-CM | POA: Diagnosis present

## 2021-08-23 DIAGNOSIS — Z880 Allergy status to penicillin: Secondary | ICD-10-CM

## 2021-08-23 DIAGNOSIS — Z882 Allergy status to sulfonamides status: Secondary | ICD-10-CM | POA: Diagnosis not present

## 2021-08-23 DIAGNOSIS — M25551 Pain in right hip: Secondary | ICD-10-CM | POA: Diagnosis present

## 2021-08-23 DIAGNOSIS — F05 Delirium due to known physiological condition: Secondary | ICD-10-CM | POA: Diagnosis not present

## 2021-08-23 DIAGNOSIS — J9622 Acute and chronic respiratory failure with hypercapnia: Secondary | ICD-10-CM | POA: Diagnosis present

## 2021-08-23 DIAGNOSIS — T43215A Adverse effect of selective serotonin and norepinephrine reuptake inhibitors, initial encounter: Secondary | ICD-10-CM | POA: Diagnosis present

## 2021-08-23 DIAGNOSIS — I10 Essential (primary) hypertension: Secondary | ICD-10-CM | POA: Diagnosis not present

## 2021-08-23 DIAGNOSIS — R1310 Dysphagia, unspecified: Secondary | ICD-10-CM | POA: Diagnosis present

## 2021-08-23 DIAGNOSIS — J9601 Acute respiratory failure with hypoxia: Principal | ICD-10-CM

## 2021-08-23 DIAGNOSIS — J9621 Acute and chronic respiratory failure with hypoxia: Secondary | ICD-10-CM | POA: Diagnosis present

## 2021-08-23 DIAGNOSIS — J9611 Chronic respiratory failure with hypoxia: Secondary | ICD-10-CM | POA: Diagnosis present

## 2021-08-23 DIAGNOSIS — G928 Other toxic encephalopathy: Principal | ICD-10-CM | POA: Diagnosis present

## 2021-08-23 DIAGNOSIS — Z79899 Other long term (current) drug therapy: Secondary | ICD-10-CM

## 2021-08-23 DIAGNOSIS — Z66 Do not resuscitate: Secondary | ICD-10-CM | POA: Diagnosis present

## 2021-08-23 DIAGNOSIS — Z9981 Dependence on supplemental oxygen: Secondary | ICD-10-CM | POA: Diagnosis not present

## 2021-08-23 DIAGNOSIS — G9341 Metabolic encephalopathy: Secondary | ICD-10-CM | POA: Diagnosis not present

## 2021-08-23 DIAGNOSIS — Z79890 Hormone replacement therapy (postmenopausal): Secondary | ICD-10-CM | POA: Diagnosis not present

## 2021-08-23 DIAGNOSIS — I110 Hypertensive heart disease with heart failure: Secondary | ICD-10-CM | POA: Diagnosis present

## 2021-08-23 DIAGNOSIS — E8729 Other acidosis: Secondary | ICD-10-CM | POA: Diagnosis present

## 2021-08-23 DIAGNOSIS — Z87891 Personal history of nicotine dependence: Secondary | ICD-10-CM

## 2021-08-23 DIAGNOSIS — E039 Hypothyroidism, unspecified: Secondary | ICD-10-CM | POA: Diagnosis present

## 2021-08-23 DIAGNOSIS — Z7982 Long term (current) use of aspirin: Secondary | ICD-10-CM | POA: Diagnosis not present

## 2021-08-23 LAB — I-STAT ARTERIAL BLOOD GAS, ED
Acid-Base Excess: 12 mmol/L — ABNORMAL HIGH (ref 0.0–2.0)
Bicarbonate: 40.1 mmol/L — ABNORMAL HIGH (ref 20.0–28.0)
Calcium, Ion: 1.24 mmol/L (ref 1.15–1.40)
HCT: 33 % — ABNORMAL LOW (ref 36.0–46.0)
Hemoglobin: 11.2 g/dL — ABNORMAL LOW (ref 12.0–15.0)
O2 Saturation: 95 %
Patient temperature: 97.8
Potassium: 4.4 mmol/L (ref 3.5–5.1)
Sodium: 134 mmol/L — ABNORMAL LOW (ref 135–145)
TCO2: 42 mmol/L — ABNORMAL HIGH (ref 22–32)
pCO2 arterial: 71.3 mmHg (ref 32–48)
pH, Arterial: 7.357 (ref 7.35–7.45)
pO2, Arterial: 83 mmHg (ref 83–108)

## 2021-08-23 LAB — URINALYSIS, ROUTINE W REFLEX MICROSCOPIC
Bilirubin Urine: NEGATIVE
Glucose, UA: NEGATIVE mg/dL
Hgb urine dipstick: NEGATIVE
Ketones, ur: NEGATIVE mg/dL
Leukocytes,Ua: NEGATIVE
Nitrite: NEGATIVE
Protein, ur: NEGATIVE mg/dL
Specific Gravity, Urine: 1.013 (ref 1.005–1.030)
pH: 6 (ref 5.0–8.0)

## 2021-08-23 LAB — CBC WITH DIFFERENTIAL/PLATELET
Abs Immature Granulocytes: 0.01 10*3/uL (ref 0.00–0.07)
Basophils Absolute: 0 10*3/uL (ref 0.0–0.1)
Basophils Relative: 1 %
Eosinophils Absolute: 0.1 10*3/uL (ref 0.0–0.5)
Eosinophils Relative: 2 %
HCT: 32.9 % — ABNORMAL LOW (ref 36.0–46.0)
Hemoglobin: 10.1 g/dL — ABNORMAL LOW (ref 12.0–15.0)
Immature Granulocytes: 0 %
Lymphocytes Relative: 14 %
Lymphs Abs: 0.8 10*3/uL (ref 0.7–4.0)
MCH: 32.3 pg (ref 26.0–34.0)
MCHC: 30.7 g/dL (ref 30.0–36.0)
MCV: 105.1 fL — ABNORMAL HIGH (ref 80.0–100.0)
Monocytes Absolute: 0.6 10*3/uL (ref 0.1–1.0)
Monocytes Relative: 10 %
Neutro Abs: 4.3 10*3/uL (ref 1.7–7.7)
Neutrophils Relative %: 73 %
Platelets: 173 10*3/uL (ref 150–400)
RBC: 3.13 MIL/uL — ABNORMAL LOW (ref 3.87–5.11)
RDW: 13.2 % (ref 11.5–15.5)
WBC: 6 10*3/uL (ref 4.0–10.5)
nRBC: 0 % (ref 0.0–0.2)

## 2021-08-23 LAB — COMPREHENSIVE METABOLIC PANEL
ALT: 16 U/L (ref 0–44)
AST: 20 U/L (ref 15–41)
Albumin: 3.4 g/dL — ABNORMAL LOW (ref 3.5–5.0)
Alkaline Phosphatase: 60 U/L (ref 38–126)
Anion gap: 9 (ref 5–15)
BUN: 13 mg/dL (ref 8–23)
CO2: 36 mmol/L — ABNORMAL HIGH (ref 22–32)
Calcium: 9.1 mg/dL (ref 8.9–10.3)
Chloride: 93 mmol/L — ABNORMAL LOW (ref 98–111)
Creatinine, Ser: 0.62 mg/dL (ref 0.44–1.00)
GFR, Estimated: >60 mL/min (ref >60)
Glucose, Bld: 107 mg/dL — ABNORMAL HIGH (ref 70–99)
Potassium: 4.3 mmol/L (ref 3.5–5.1)
Sodium: 138 mmol/L (ref 135–145)
Total Bilirubin: 0.7 mg/dL (ref 0.3–1.2)
Total Protein: 6.7 g/dL (ref 6.5–8.1)

## 2021-08-23 LAB — AMMONIA: Ammonia: <10 umol/L (ref 9–35)

## 2021-08-23 LAB — BRAIN NATRIURETIC PEPTIDE: B Natriuretic Peptide: 171.1 pg/mL — ABNORMAL HIGH (ref 0.0–100.0)

## 2021-08-23 LAB — TROPONIN I (HIGH SENSITIVITY)
Troponin I (High Sensitivity): 7 ng/L (ref <18)
Troponin I (High Sensitivity): 6 ng/L (ref <18)

## 2021-08-23 LAB — TSH: TSH: 8.159 u[IU]/mL — ABNORMAL HIGH (ref 0.350–4.500)

## 2021-08-23 LAB — LACTIC ACID, PLASMA: Lactic Acid, Venous: 0.6 mmol/L (ref 0.5–1.9)

## 2021-08-23 IMAGING — DX DG CHEST 1V PORT
1 series · 1 of 1 positions shown · non-contrast
Comparison: [DATE]

CLINICAL DATA: ams

EXAM:
PORTABLE CHEST 1 VIEW

[chest]
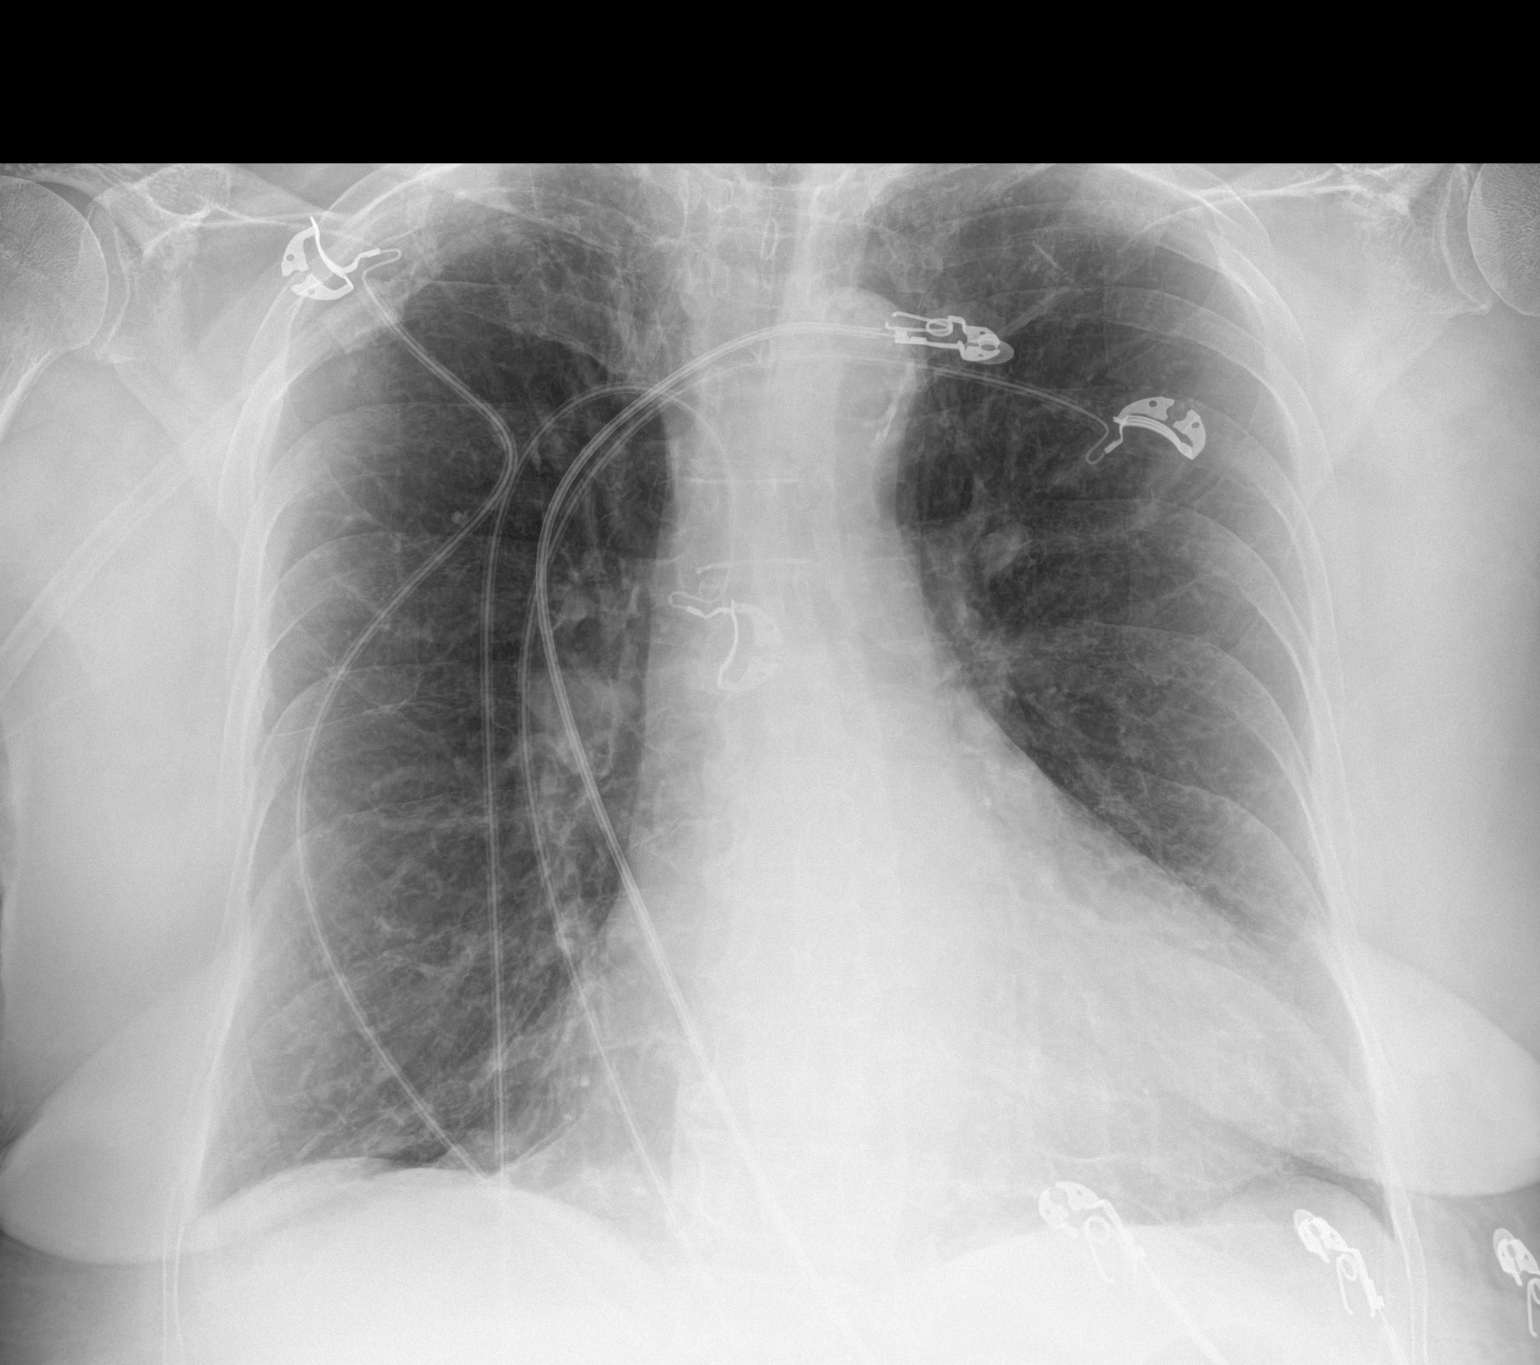

[1 of 1 positions shown; findings below may reference images not displayed]

FINDINGS: Mild cardiomegaly. Biapical pleural pulmonary scarring. There are
some interstitial changes seen. No consolidation, pleural effusion
or significant vascular congestion. Thoracic spondylosis.
IMPRESSION: No active disease.

## 2021-08-23 IMAGING — MR MR HEAD W/O CM
5 of 11 series · 21 of 48 positions shown · non-contrast
Comparison: Head CT [DATE].

CLINICAL DATA: Provided history: Neuro deficit, acute, stroke
suspected.

EXAM:
MRI HEAD WITHOUT CONTRAST
TECHNIQUE: Multiplanar, multiecho pulse sequences of the brain and surrounding
structures were obtained without intravenous contrast.

[Series 2: DWI · axial · 3.0mm · 0.94mm/px · z∈[-70,+79]mm · 8 of 104 slices shown (1 of 2)]
[im 1/104]
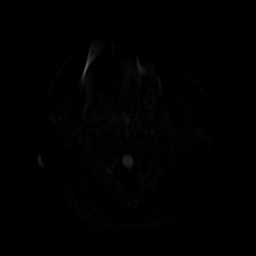
[im 12/104]
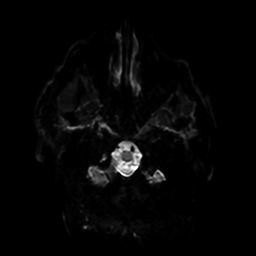
[im 35/104]
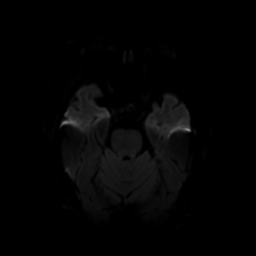
[im 46/104]
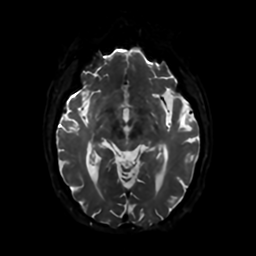
[im 58/104]
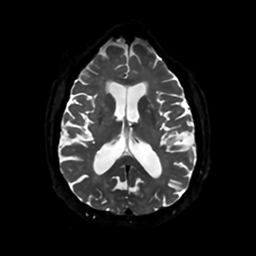
[im 69/104]
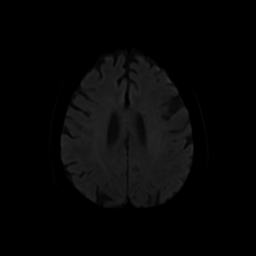
[im 92/104]
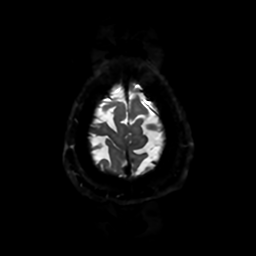
[im 104/104]
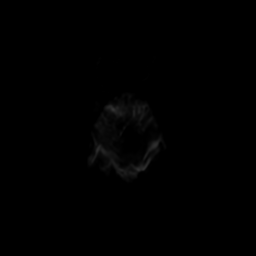

[Series 3: DWI · coronal · 4.0mm · 0.94mm/px · 3 of 74 slices shown (2 of 2)]
[im 1/74]
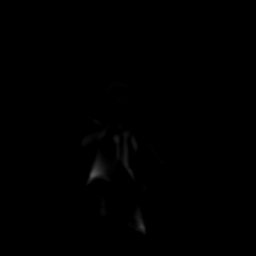
[im 13/74]
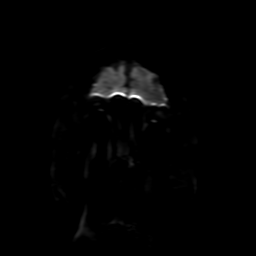
[im 25/74]
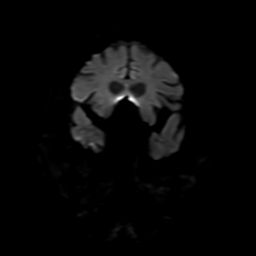

[Series 4: FLAIR · sagittal · 5.0mm · 0.23mm/px · 2 of 23 slices shown (1 of 3)]
[im 1/23]
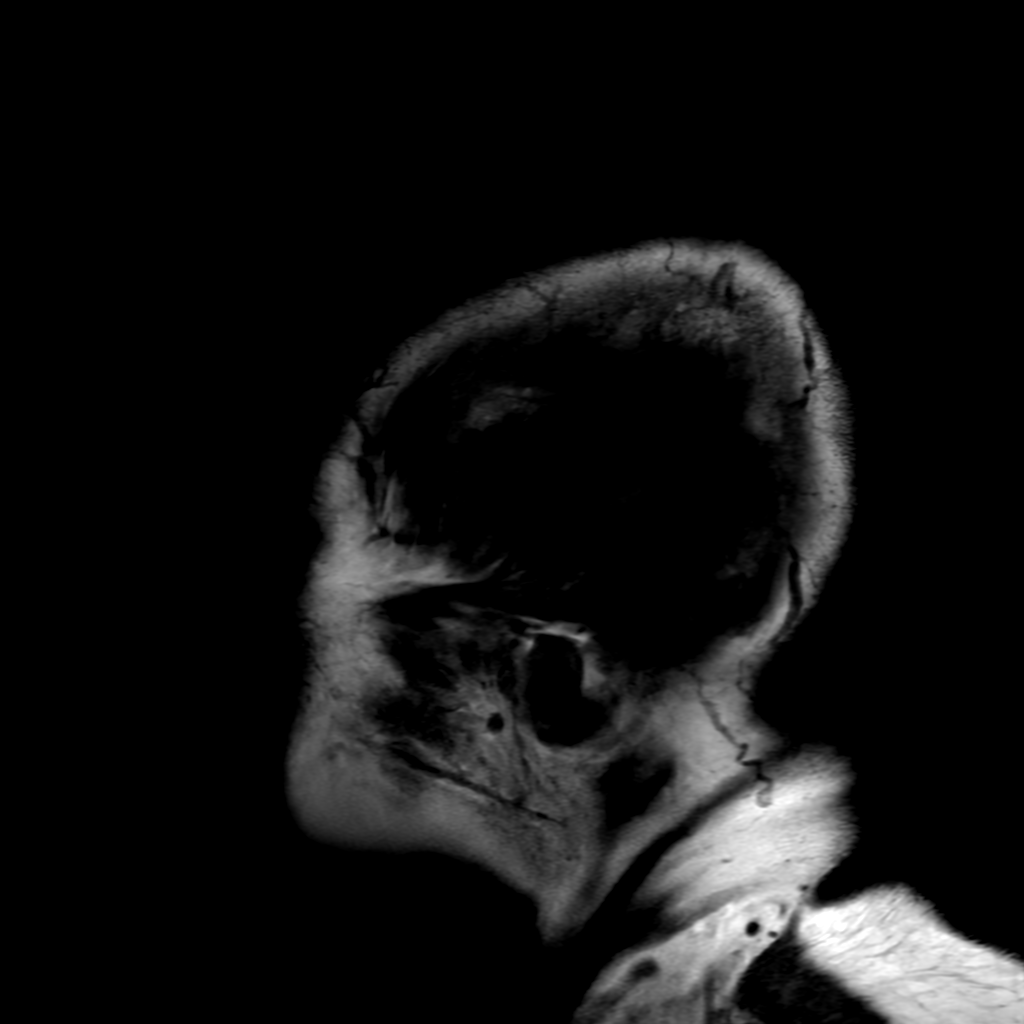
[im 23/23]
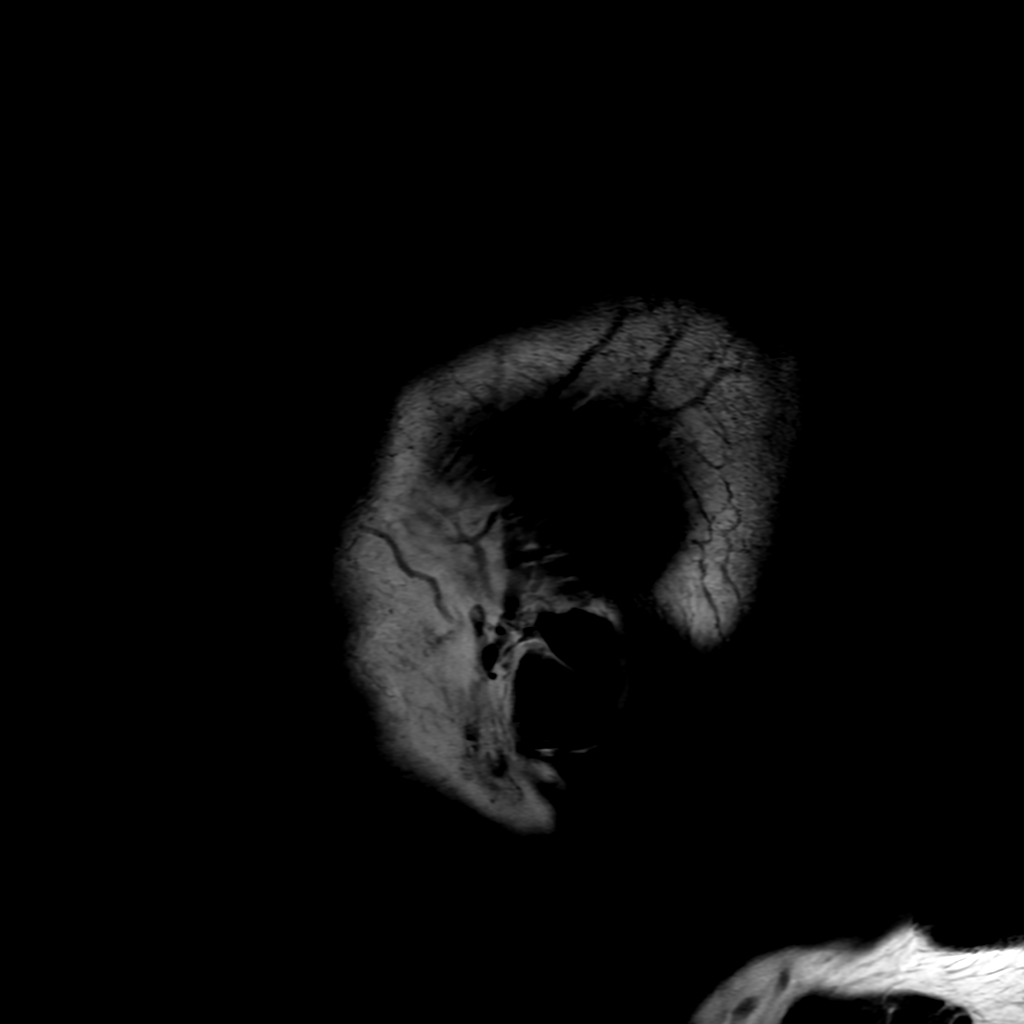

[Series 6: FLAIR · axial · 4.0mm · 0.47mm/px · z∈[-93,+56]mm · 4 of 35 slices shown (2 of 3)]
[im 1/35]
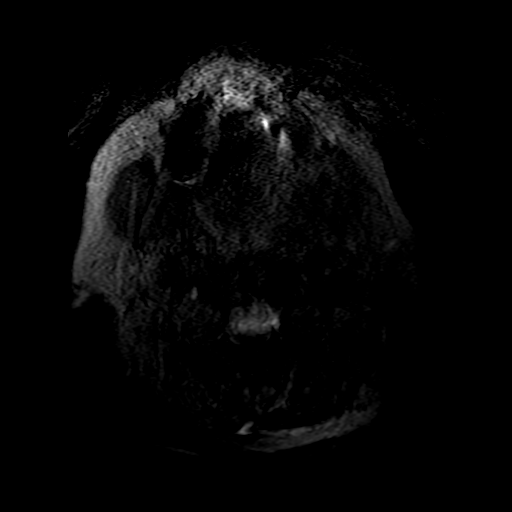
[im 12/35]
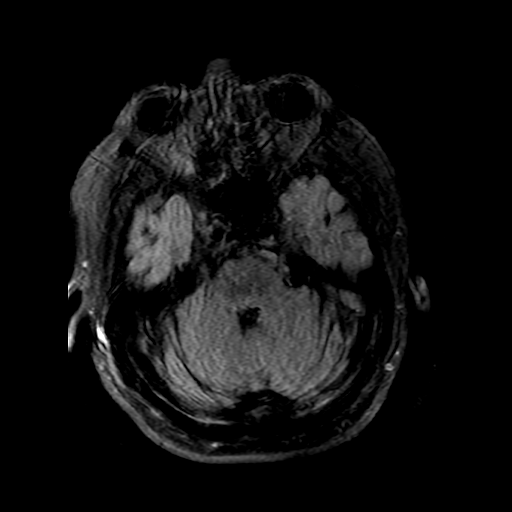
[im 23/35]
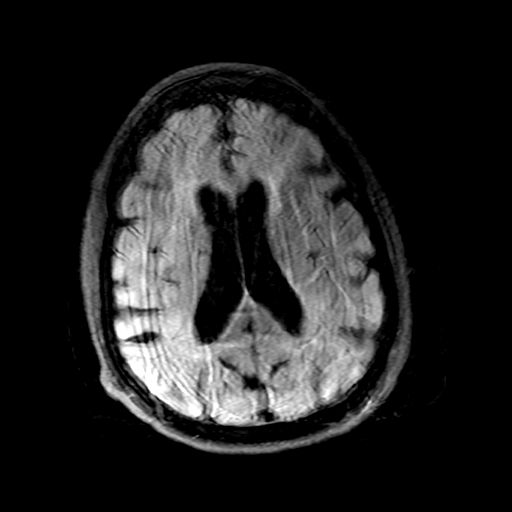
[im 35/35]
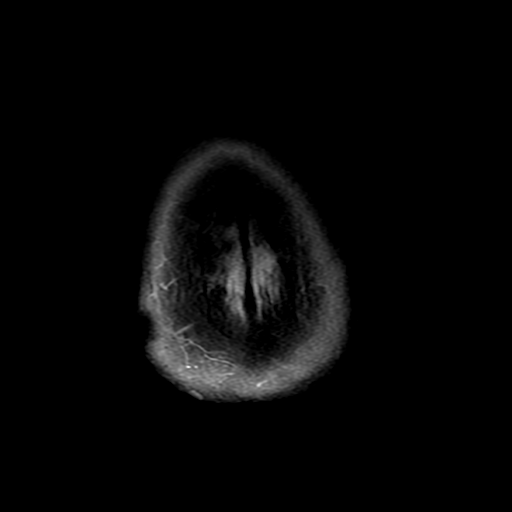

[Series 10: FLAIR · axial · 4.0mm · 0.47mm/px · z∈[-93,+56]mm · 4 of 35 slices shown (3 of 3)]
[im 1/35]
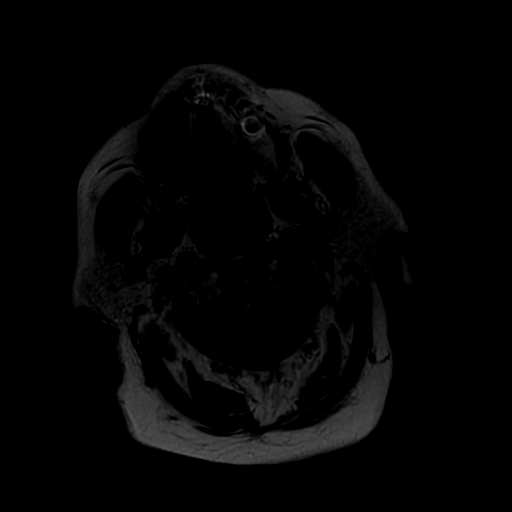
[im 12/35]
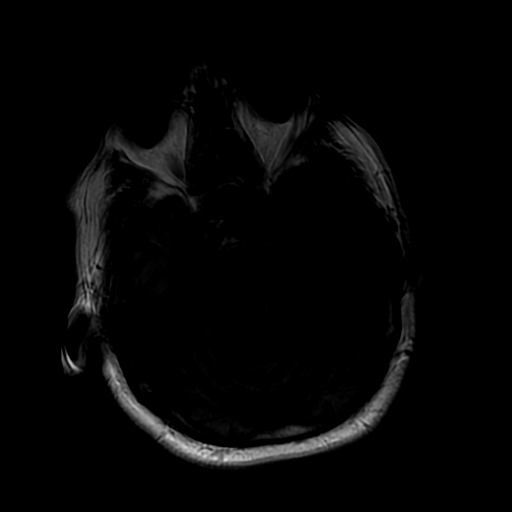
[im 23/35]
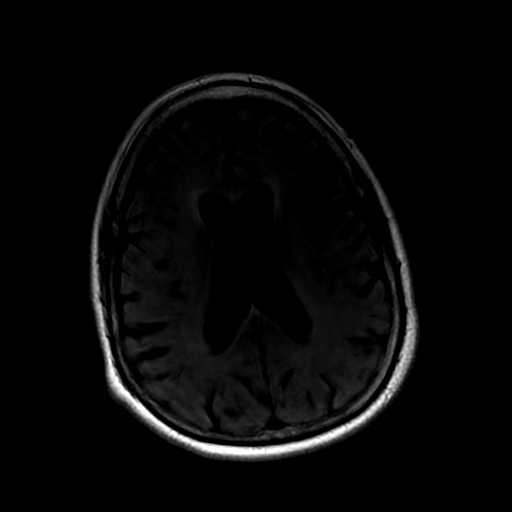
[im 35/35]
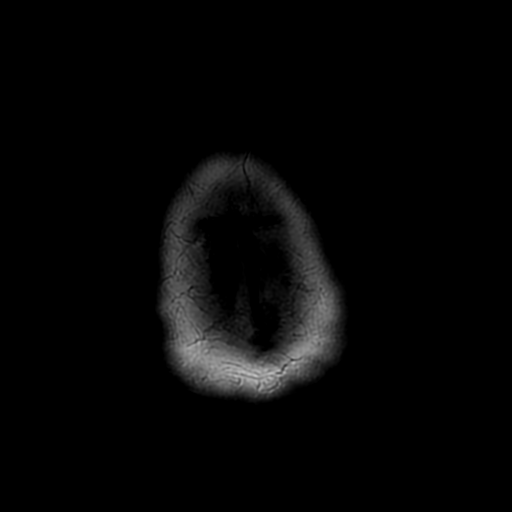

[21 of 48 positions shown; findings below may reference images not displayed]

FINDINGS: Intermittently motion degraded exam, limiting evaluation. Most
notably, there is moderate to severe motion degradation of the axial
T2 TSE sequence, moderate to severe motion degradation of the least
motion degraded axial T2 FLAIR sequence, moderate to severe motion
degradation of the axial T2 GRE sequence, moderate motion
degradation of the coronal T2 TSE sequence and moderate motion
degradation of the axial T1 weighted sequence.

Brain:

Mild generalized cerebral atrophy.

Mild T2 FLAIR hyperintense signal abnormality within the
periventricular cerebral white matter, nonspecific but compatible
with chronic small vessel ischemic disease.

Small focus of T2* signal loss within the medial left frontal lobe
which may reflect a developmental venous anomaly, focus
mineralization or chronic microhemorrhage.

There is no acute infarct.

Within the limitations of motion degradation, no intracranial mass
or extra-axial fluid collection is identified.

No midline shift.

Vascular: Maintained flow voids within the proximal large arterial
vessels.

Skull and upper cervical spine: No focal suspicious marrow lesion.
Incompletely assessed cervical spondylosis.

Sinuses/Orbits: No mass or acute finding within the imaged orbits.
Prior bilateral ocular lens replacement. Small fluid level within
the right sphenoid sinus.
IMPRESSION: Significantly motion degraded examination, as described and limiting
evaluation.

The diffusion-weighted imaging is of good quality. No evidence of
acute infarct.

Mild chronic small vessel ischemic changes within the cerebral white
matter.

Small focus of T2* signal loss within the medial left frontal lobe
which may reflect a developmental venous anomaly, chronic
microhemorrhage or focus of mineralization.

Mild generalized cerebral atrophy.

Right sphenoid sinusitis.

## 2021-08-23 IMAGING — DX DG PORTABLE PELVIS
1 series · 1 of 1 positions shown · non-contrast
Comparison: [DATE]

CLINICAL DATA: Right hip pain

EXAM:
PORTABLE PELVIS 1-2 VIEWS

[pelvis ap]
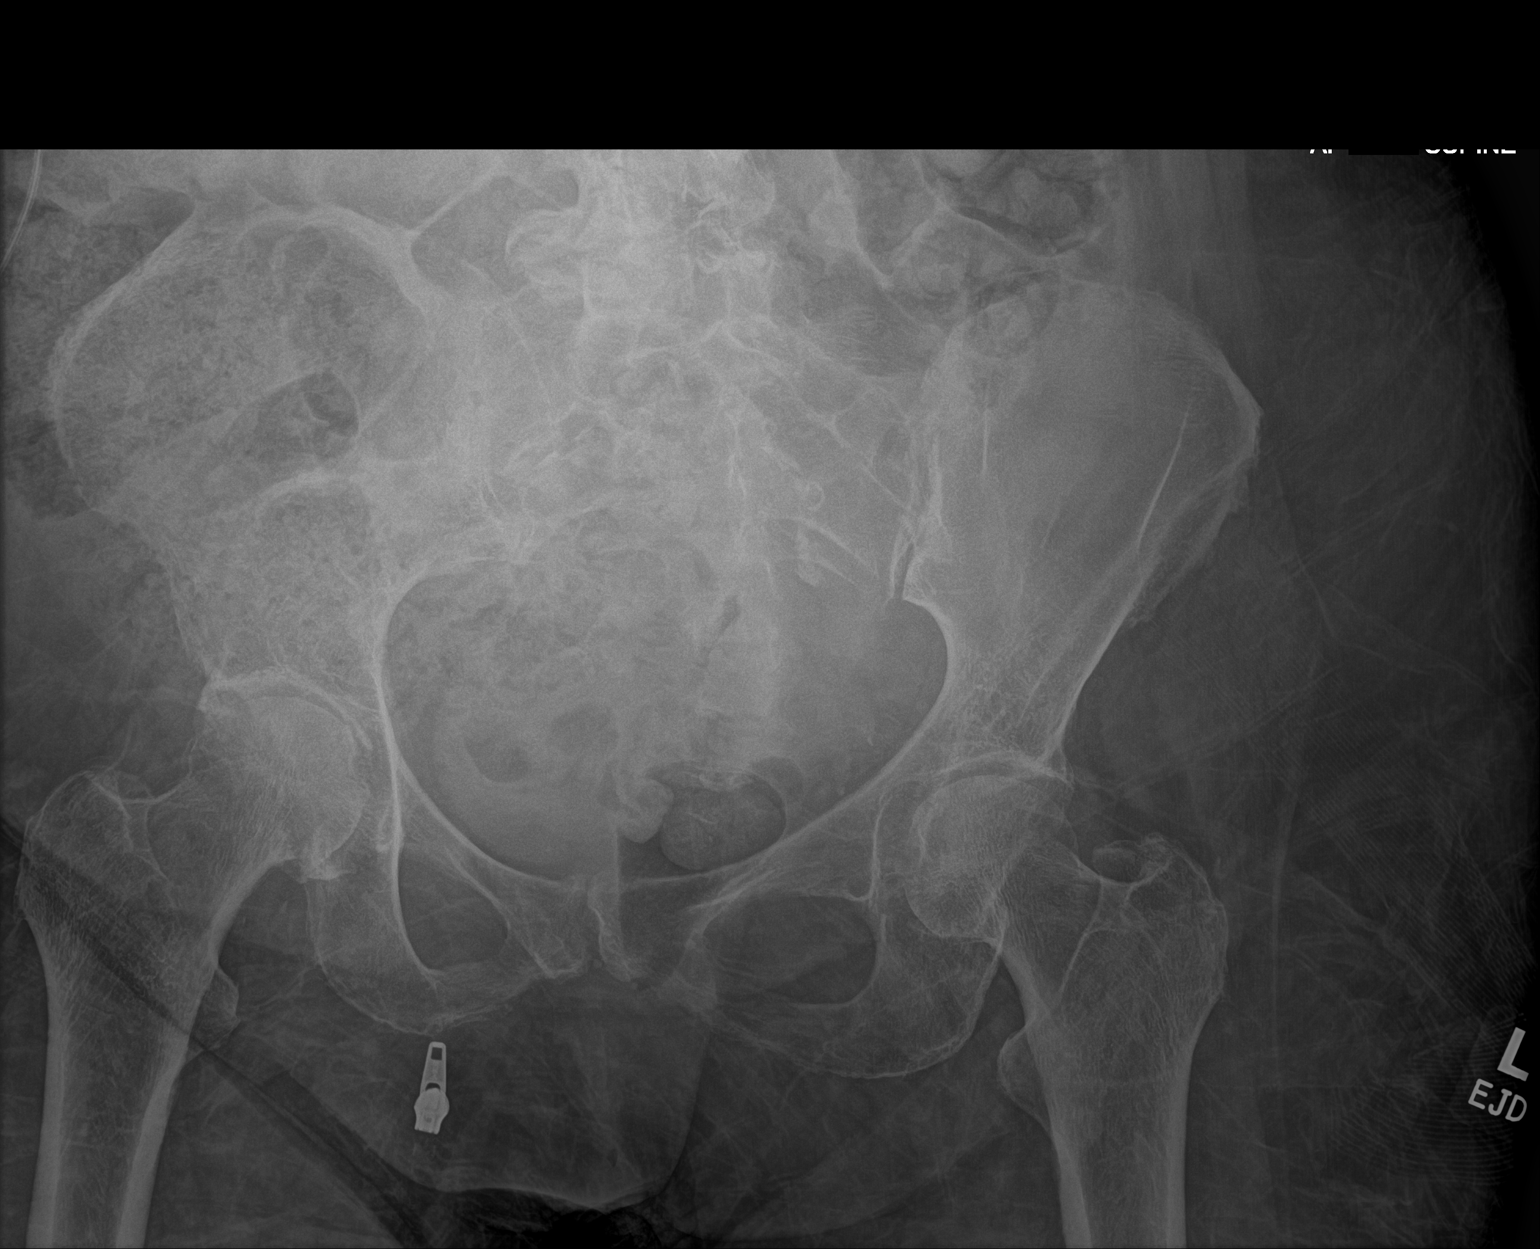

[1 of 1 positions shown; findings below may reference images not displayed]

FINDINGS: No displaced fracture or dislocation is seen. Small bony spurs seen
in the right hip. Bony spurs are noted in the pubic symphysis.
Degenerative changes are noted in the visualized lower lumbar spine.
IMPRESSION: No recent fracture or dislocation is seen. Degenerative changes are
noted in the right hip.

## 2021-08-23 IMAGING — CT CT HEAD W/O CM
3 of 4 series · 16 of 47 positions shown, 19 images · non-contrast
Comparison: None Available.

CLINICAL DATA: Mental status change, unknown cause



[Series 3: head 5.0 h30s · axial · 0.49mm/px · z∈[-126,+14]mm · 10 of 34 slices shown, 13 images]
[im 3/34  brain]
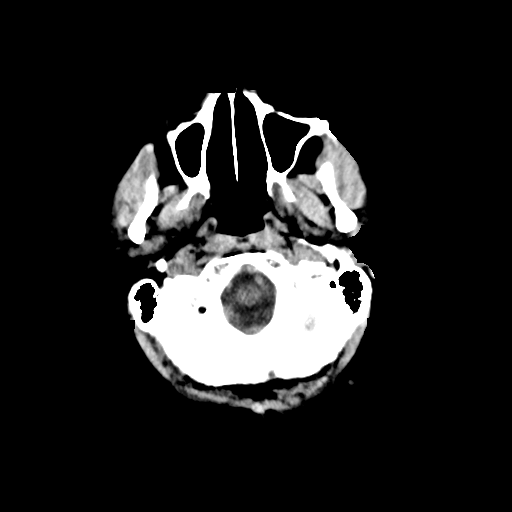
[im 3/34  bone]
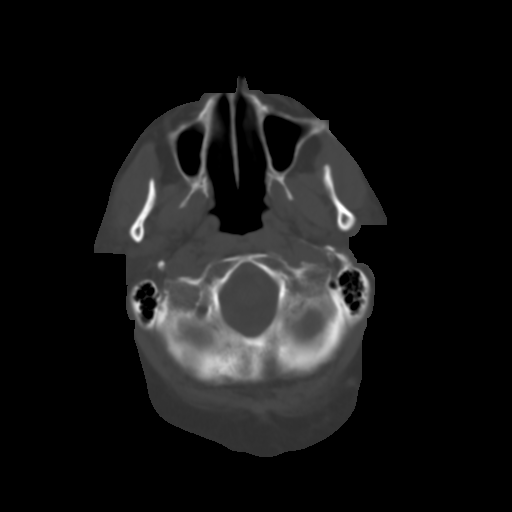
[im 5/34  brain]
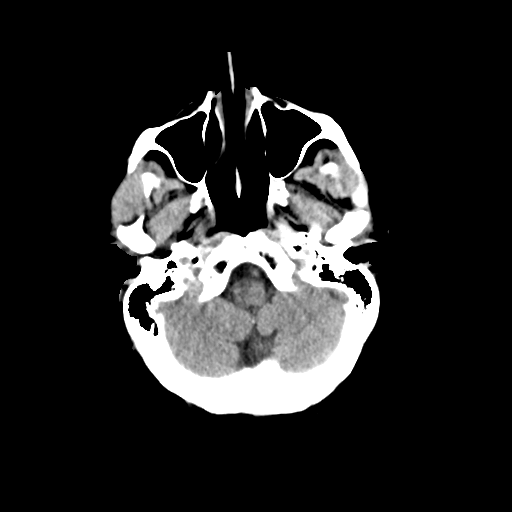
[im 10/34  brain]
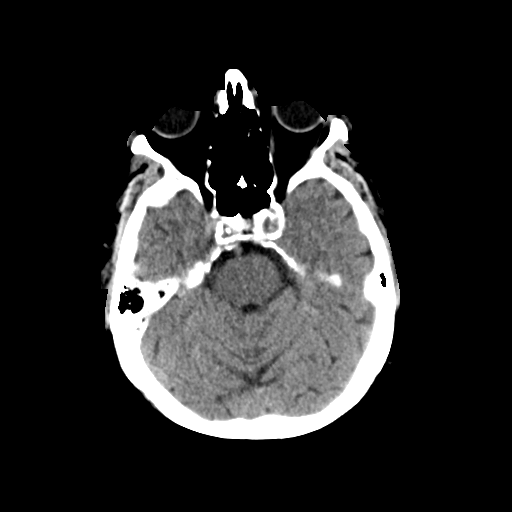
[im 12/34  brain]
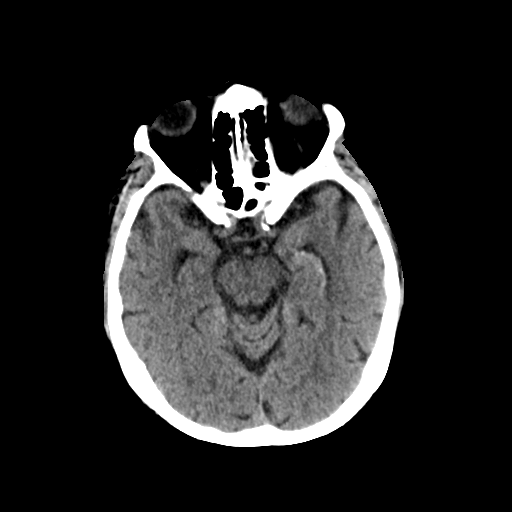
[im 15/34  brain]
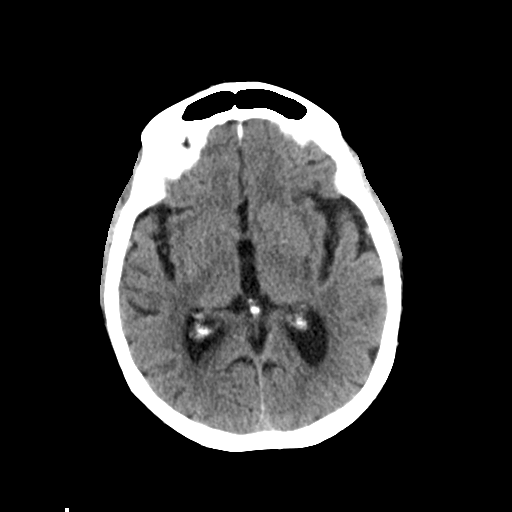
[im 15/34  bone]
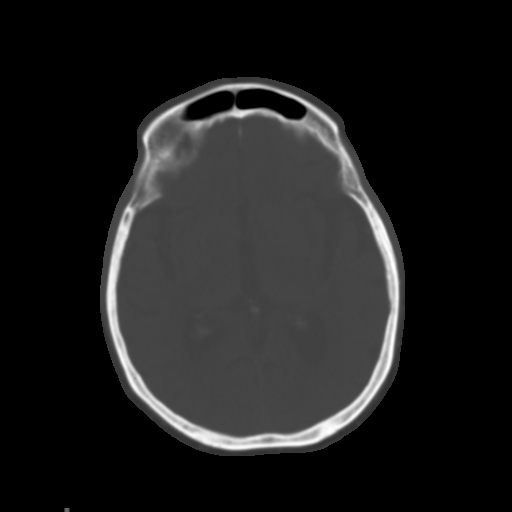
[im 19/34  brain]
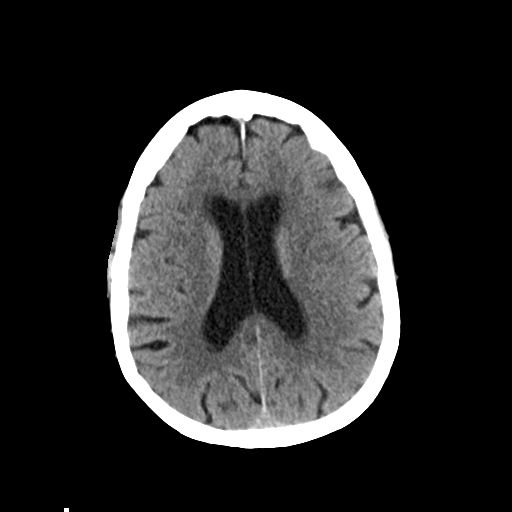
[im 22/34  brain]
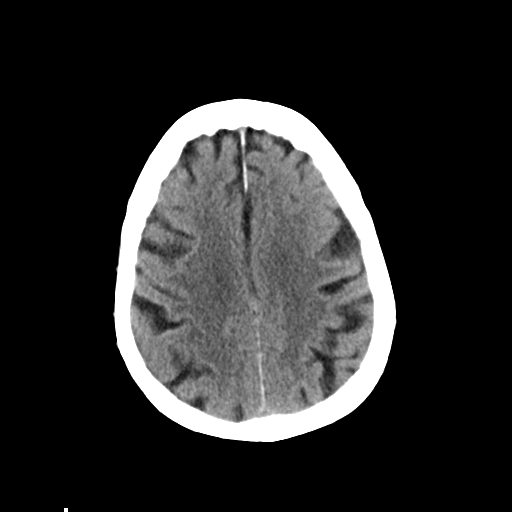
[im 24/34  brain]
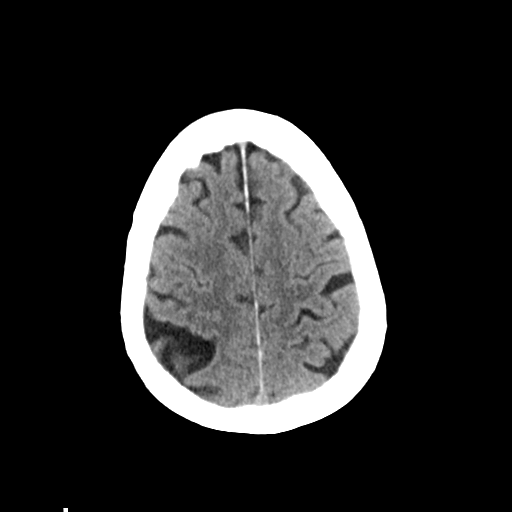
[im 29/34  brain]
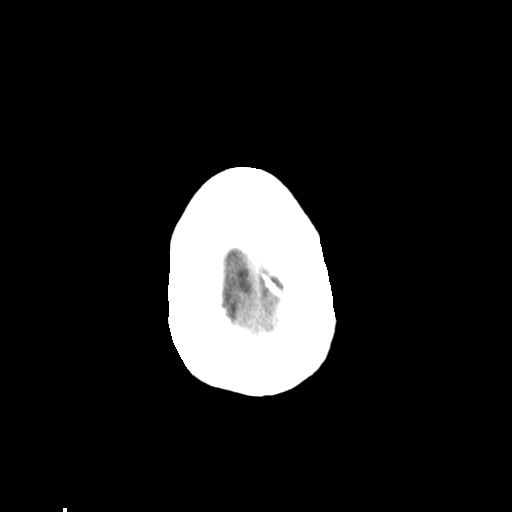
[im 29/34  bone]
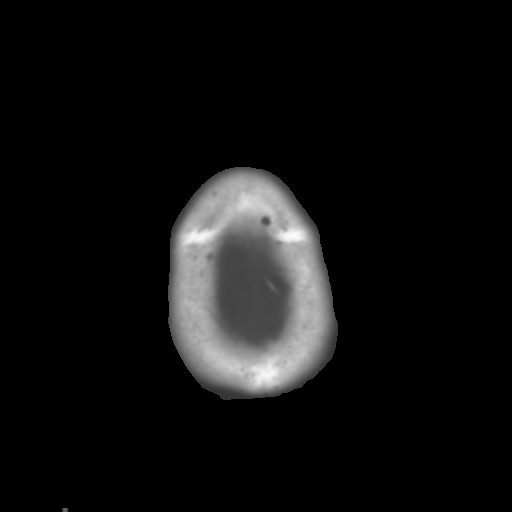
[im 31/34  brain]
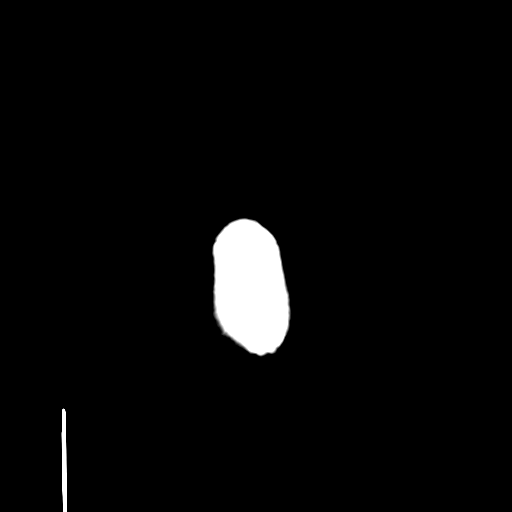

[Series 5: head 3.0 mpr cor · coronal · 0.36mm/px · 3 of 70 slices shown]
[im 24/70  brain]
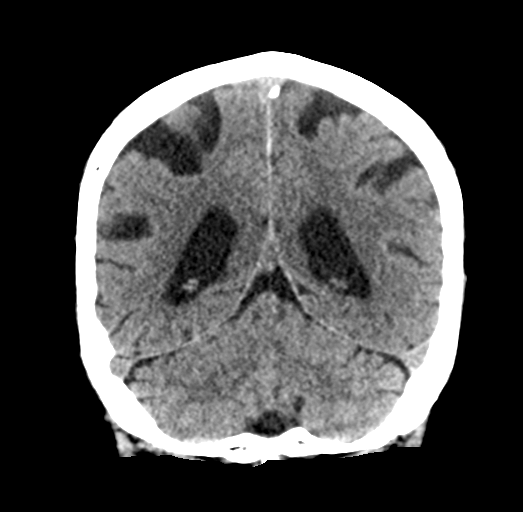
[im 31/70  brain]
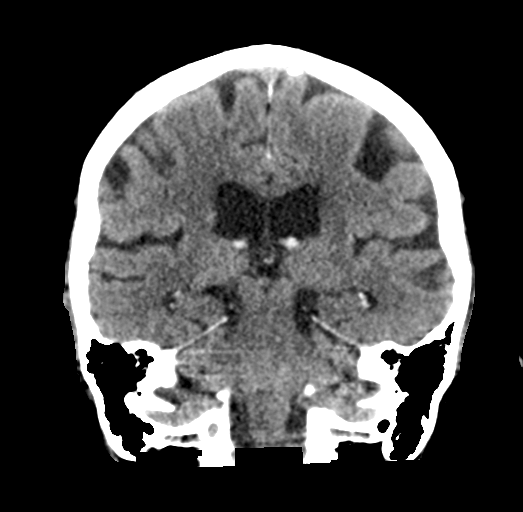
[im 39/70  brain]
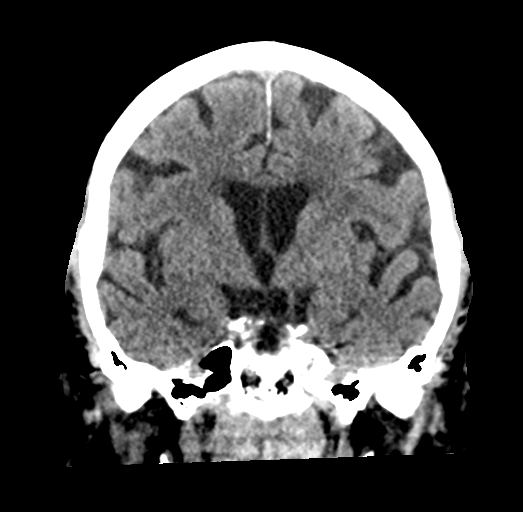

[Series 6: head 3.0 mpr sag · sagittal · 0.33mm/px · 3 of 66 slices shown]
[im 22/66  brain]
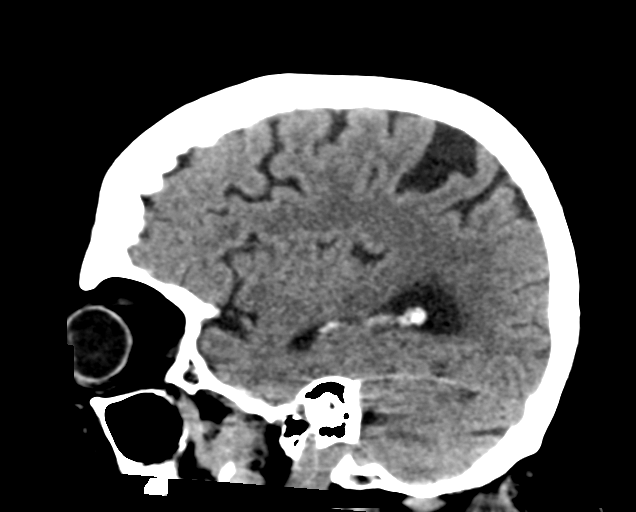
[im 33/66  brain]
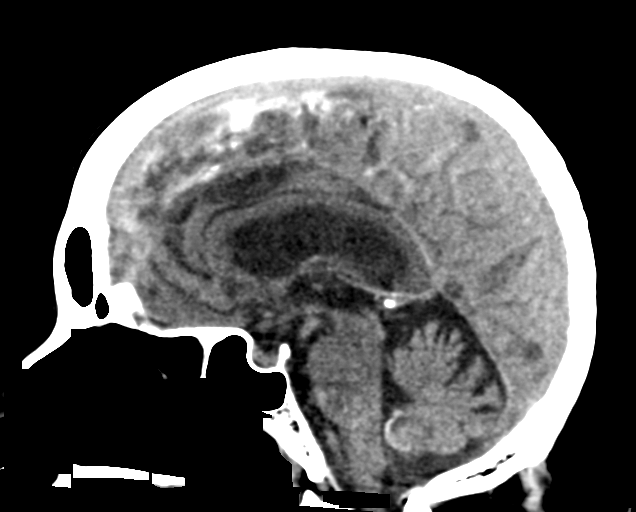
[im 44/66  brain]
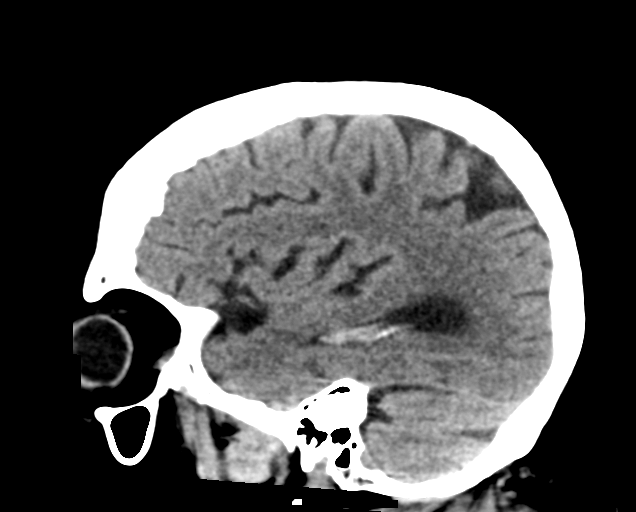

[16 of 47 positions shown; findings below may reference images not displayed]

FINDINGS: Brain: No evidence of acute infarction, hemorrhage, hydrocephalus,
extra-axial collection or mass lesion/mass effect. Patchy white
matter hypodensities, nonspecific but compatible with chronic
microvascular ischemic disease.

Vascular: No hyperdense vessel identified.

Skull: No acute fracture.

Sinuses/Orbits: Clear sinuses.  No acute orbital findings.

Other: No mastoid effusions.
IMPRESSION: 1. No evidence of acute intracranial abnormality.
2. Chronic microvascular ischemic disease.

## 2021-08-23 MED ORDER — SENNA 8.6 MG PO TABS
1.0000 | ORAL_TABLET | Freq: Two times a day (BID) | ORAL | Status: DC
  Administered 2021-08-23 – 2021-08-26 (×6): 8.6 mg via ORAL
  Filled 2021-08-23 (×6): qty 1

## 2021-08-23 MED ORDER — POLYETHYLENE GLYCOL 3350 17 GM/SCOOP PO POWD
17.0000 g | Freq: Every day | ORAL | Status: DC
Start: 2021-08-23 — End: 2021-08-23
  Filled 2021-08-23: qty 255

## 2021-08-23 MED ORDER — ENOXAPARIN SODIUM 40 MG/0.4ML IJ SOSY
40.0000 mg | PREFILLED_SYRINGE | INTRAMUSCULAR | Status: DC
  Administered 2021-08-23 – 2021-08-25 (×3): 40 mg via SUBCUTANEOUS
  Filled 2021-08-23 (×3): qty 0.4

## 2021-08-23 MED ORDER — ACETAMINOPHEN 500 MG PO TABS
1000.0000 mg | ORAL_TABLET | Freq: Three times a day (TID) | ORAL | Status: DC
  Administered 2021-08-23 – 2021-08-24 (×2): 1000 mg via ORAL
  Filled 2021-08-23 (×2): qty 2

## 2021-08-23 MED ORDER — ALBUTEROL SULFATE (2.5 MG/3ML) 0.083% IN NEBU
2.5000 mg | INHALATION_SOLUTION | Freq: Three times a day (TID) | RESPIRATORY_TRACT | Status: DC
Start: 2021-08-24 — End: 2021-08-23

## 2021-08-23 MED ORDER — ALIVE WOMENS 50+ GUMMY PO CHEW: CHEWABLE_TABLET | Freq: Every morning | ORAL | Status: DC

## 2021-08-23 MED ORDER — FAMOTIDINE 20 MG PO TABS
20.0000 mg | ORAL_TABLET | Freq: Every day | ORAL | Status: DC
  Administered 2021-08-23 – 2021-08-25 (×3): 20 mg via ORAL
  Filled 2021-08-23 (×3): qty 1

## 2021-08-23 MED ORDER — SODIUM CHLORIDE 0.9 % IV SOLN: INTRAVENOUS | Status: AC

## 2021-08-23 MED ORDER — ALBUTEROL SULFATE (2.5 MG/3ML) 0.083% IN NEBU
2.5000 mg | INHALATION_SOLUTION | Freq: Three times a day (TID) | RESPIRATORY_TRACT | Status: DC
Start: 2021-08-23 — End: 2021-08-24
  Administered 2021-08-23 – 2021-08-24 (×2): 2.5 mg via RESPIRATORY_TRACT
  Filled 2021-08-23 (×2): qty 3

## 2021-08-23 MED ORDER — ARFORMOTEROL TARTRATE 15 MCG/2ML IN NEBU
15.0000 ug | INHALATION_SOLUTION | Freq: Two times a day (BID) | RESPIRATORY_TRACT | Status: DC
  Administered 2021-08-23 – 2021-08-26 (×6): 15 ug via RESPIRATORY_TRACT
  Filled 2021-08-23 (×6): qty 2

## 2021-08-23 MED ORDER — POLYVINYL ALCOHOL 1.4 % OP SOLN
1.0000 [drp] | Freq: Two times a day (BID) | OPHTHALMIC | Status: DC
  Administered 2021-08-23 – 2021-08-26 (×6): 1 [drp] via OPHTHALMIC
  Filled 2021-08-23: qty 15

## 2021-08-23 MED ORDER — ASPIRIN 81 MG PO TBEC
81.0000 mg | DELAYED_RELEASE_TABLET | Freq: Every day | ORAL | Status: DC
  Administered 2021-08-23 – 2021-08-26 (×4): 81 mg via ORAL
  Filled 2021-08-23 (×4): qty 1

## 2021-08-23 MED ORDER — ADULT MULTIVITAMIN W/MINERALS CH
1.0000 | ORAL_TABLET | Freq: Every day | ORAL | Status: DC
  Administered 2021-08-24 – 2021-08-26 (×3): 1 via ORAL
  Filled 2021-08-23 (×3): qty 1

## 2021-08-23 MED ORDER — IPRATROPIUM-ALBUTEROL 0.5-2.5 (3) MG/3ML IN SOLN: 3.0000 mL | Freq: Once | RESPIRATORY_TRACT | Status: DC

## 2021-08-23 MED ORDER — ENALAPRIL MALEATE 20 MG PO TABS
20.0000 mg | ORAL_TABLET | Freq: Two times a day (BID) | ORAL | Status: DC
  Filled 2021-08-23: qty 1

## 2021-08-23 MED ORDER — AMLODIPINE BESYLATE 5 MG PO TABS
5.0000 mg | ORAL_TABLET | Freq: Every day | ORAL | Status: DC
  Administered 2021-08-23 – 2021-08-26 (×4): 5 mg via ORAL
  Filled 2021-08-23 (×4): qty 1

## 2021-08-23 MED ORDER — LEVOTHYROXINE SODIUM 25 MCG PO TABS
125.0000 ug | ORAL_TABLET | Freq: Every morning | ORAL | Status: DC
  Administered 2021-08-24: 125 ug via ORAL
  Filled 2021-08-23: qty 1

## 2021-08-23 MED ORDER — UMECLIDINIUM BROMIDE 62.5 MCG/ACT IN AEPB
1.0000 | INHALATION_SPRAY | Freq: Every day | RESPIRATORY_TRACT | Status: DC
  Administered 2021-08-24 – 2021-08-26 (×3): 1 via RESPIRATORY_TRACT
  Filled 2021-08-23: qty 7

## 2021-08-23 MED ORDER — ONDANSETRON HCL 4 MG PO TABS: 4.0000 mg | ORAL_TABLET | Freq: Four times a day (QID) | ORAL | Status: DC | PRN

## 2021-08-23 MED ORDER — ENALAPRIL MALEATE 2.5 MG PO TABS
20.0000 mg | ORAL_TABLET | Freq: Two times a day (BID) | ORAL | Status: DC
  Filled 2021-08-23: qty 1
  Filled 2021-08-23: qty 4
  Filled 2021-08-23: qty 8

## 2021-08-23 MED ORDER — ALBUTEROL SULFATE (2.5 MG/3ML) 0.083% IN NEBU
2.5000 mg | INHALATION_SOLUTION | Freq: Three times a day (TID) | RESPIRATORY_TRACT | Status: DC
Start: 2021-08-23 — End: 2021-08-23

## 2021-08-23 MED ORDER — POLYETHYLENE GLYCOL 3350 17 G PO PACK
17.0000 g | PACK | Freq: Every day | ORAL | Status: DC
  Administered 2021-08-23 – 2021-08-26 (×3): 17 g via ORAL
  Filled 2021-08-23 (×3): qty 1

## 2021-08-23 MED ORDER — ONDANSETRON HCL 4 MG/2ML IJ SOLN: 4.0000 mg | Freq: Four times a day (QID) | INTRAMUSCULAR | Status: DC | PRN

## 2021-08-23 NOTE — Assessment & Plan Note (Signed)
Not acutely exacerbated Continue as needed bronchodilator therapy as well as inhaled steroids Continue oxygen supplementation to maintain pulse oximetry greater than 92%

## 2021-08-23 NOTE — ED Provider Notes (Signed)
[facility] EMERGENCY DEPARTMENT Provider Note   CSN: [id number] Arrival date & time: 08/23/21  1246     History  Chief Complaint  Patient presents with   Altered Mental Status    Valerie Bowman is a 87 y.o. female.  Level 5 caveat for altered mental status.  Patient brought in by EMS with decreased responsiveness for the past 3 days.  She was found slumped over in a recliner and the call was for unconscious person in her facility.  On EMS arrival she was not responding until fire department kicked over an oxygen container and startled her.  Patient now responding to some questions and following commands.  She is oriented to person and place. EMS reports hypoxic on her 2 L nasal cannula oxygen with sats in the 80s.  They increased her to 4 L.  Patient states she does feel somewhat shortness of breath.  Has had a nonproductive cough and congestion which she attributes to the pollen.  No fevers.  No chest pain.  No abdominal pain, nausea or vomiting.  No leg pain or leg swelling.  Has reportedly been recently treated for pneumonia. States she feels short of breath currently.  Denies any pain.  The history is provided by the patient and the EMS personnel. The history is limited by the condition of the patient.  Altered Mental Status      Home Medications Prior to Admission medications   Medication Sig Start Date End Date Taking? Authorizing Provider  acetaminophen (TYLENOL) 500 MG tablet Take 500 mg by mouth every 6 (six) hours as needed for mild pain, fever or headache.    [provider]  albuterol (VENTOLIN HFA) 108 (90 Base) MCG/ACT inhaler Inhale 1 puff into the lungs every 6 (six) hours as needed for wheezing or shortness of breath. 03/01/21   Cipriano Bunker, MD  amLODipine (NORVASC) 5 MG tablet Take 5 mg by mouth daily.    [provider]  aspirin EC 81 MG tablet Take 81 mg by mouth daily. Swallow whole.    [provider]  enalapril  (VASOTEC) 20 MG tablet Take 20 mg by mouth 2 (two) times daily.    [provider]  HYDROcodone-acetaminophen (NORCO) 10-325 MG tablet Take 0.5 tablets by mouth every 6 (six) hours as needed. 04/23/21   Gwyneth Sprout, MD  levothyroxine (SYNTHROID) 125 MCG tablet Take 125 mcg by mouth daily at 6 (six) AM.    [provider]  metoprolol succinate (TOPROL-XL) 50 MG 24 hr tablet Take 50 mg by mouth daily. Take with or immediately following a meal.    [provider]  Multiple Vitamin (MULTIVITAMIN WITH MINERALS) TABS tablet Take 1 tablet by mouth daily.    [provider]  polyethylene glycol (MIRALAX / GLYCOLAX) 17 g packet Take 17 g by mouth daily.    [provider]  polyvinyl alcohol (LIQUIFILM TEARS) 1.4 % ophthalmic solution Place 1 drop into both eyes 2 (two) times daily.    [provider]  senna (SENOKOT) 8.6 MG TABS tablet Take 1 tablet by mouth 2 (two) times daily.    [provider]  Tiotropium Bromide-Olodaterol 2.5-2.5 MCG/ACT AERS Inhale 2 puffs into the lungs daily.    [provider]      Allergies    Penicillins and Sulfa antibiotics    Review of Systems   Review of Systems  Unable to perform ROS: Mental status change    Physical Exam Updated Vital Signs  There were no vitals taken for this visit. Physical Exam Vitals and nursing note reviewed.  Constitutional:      General: She is not in acute distress.    Appearance: She is well-developed. She is ill-appearing.     Comments: Somnolent, slow to respond, oriented to person and place  HENT:     Head: Normocephalic and atraumatic.     Nose: Congestion present.     Mouth/Throat:     Pharynx: No oropharyngeal exudate.  Eyes:     Conjunctiva/sclera: Conjunctivae normal.     Pupils: Pupils are equal, round, and reactive to light.  Neck:     Comments: No meningismus. Cardiovascular:     Rate and Rhythm: Normal rate and regular rhythm.     Heart  sounds: Normal heart sounds. No murmur heard. Pulmonary:     Effort: Pulmonary effort is normal. No respiratory distress.     Breath sounds: Wheezing present.     Comments: Scattered expiratory wheeze Abdominal:     Palpations: Abdomen is soft.     Tenderness: There is no abdominal tenderness. There is no guarding or rebound.  Musculoskeletal:        General: No tenderness. Normal range of motion.     Cervical back: Normal range of motion and neck supple.  Skin:    General: Skin is warm.  Neurological:     General: No focal deficit present.     Mental Status: She is alert. Mental status is at baseline.     Cranial Nerves: No cranial nerve deficit.     Motor: No abnormal muscle tone.     Coordination: Coordination normal.     Comments: No facial droop.  5/5 strength throughout, able to lift legs and arms off the bed bilaterally  Psychiatric:        Behavior: Behavior normal.     ED Results / Procedures / Treatments   Labs (all labs ordered are listed, but only abnormal results are displayed) Labs Reviewed  CBC WITH DIFFERENTIAL/PLATELET - Abnormal; Notable for the following components:      Result Value   RBC 3.13 (*)    Hemoglobin 10.1 (*)    HCT 32.9 (*)    MCV 105.1 (*)    All other components within normal limits  COMPREHENSIVE METABOLIC PANEL - Abnormal; Notable for the following components:   Chloride 93 (*)    CO2 36 (*)    Glucose, Bld 107 (*)    Albumin 3.4 (*)    All other components within normal limits  BRAIN NATRIURETIC PEPTIDE - Abnormal; Notable for the following components:   B Natriuretic Peptide 171.1 (*)    All other components within normal limits  TSH - Abnormal; Notable for the following components:   TSH 8.159 (*)    All other components within normal limits  I-STAT ARTERIAL BLOOD GAS, ED - Abnormal; Notable for the following components:   pCO2 arterial 71.3 (*)    Bicarbonate 40.1 (*)    TCO2 42 (*)    Acid-Base Excess 12.0 (*)    Sodium 134  (*)    HCT 33.0 (*)    Hemoglobin 11.2 (*)    All other components within normal limits  LACTIC ACID, PLASMA  AMMONIA  URINALYSIS, ROUTINE W REFLEX MICROSCOPIC  CBC  BASIC METABOLIC PANEL  TROPONIN I (HIGH SENSITIVITY)  TROPONIN I (HIGH SENSITIVITY)    EKG EKG Interpretation  Date/Time:  Thursday August 23 2021 13:24:12 EDT Ventricular Rate:  69  PR Interval:  186 QRS Duration: 99 QT Interval:  419 QTC Calculation: 449 R Axis:   70 Text Interpretation: Sinus rhythm Anteroseptal infarct, age indeterminate No significant change was found Confirmed by Glynn Octave [phone]) on 08/23/2021 1:31:15 PM  Radiology DG Pelvis Portable  Result Date: 08/23/2021 CLINICAL DATA:  Right hip pain EXAM: PORTABLE PELVIS 1-2 VIEWS COMPARISON:  02/20/2021 FINDINGS: No displaced fracture or dislocation is seen. Small bony spurs seen in the right hip. Bony spurs are noted in the pubic symphysis. Degenerative changes are noted in the visualized lower lumbar spine. IMPRESSION: No recent fracture or dislocation is seen. Degenerative changes are noted in the right hip. Electronically Signed   By: Ernie Avena M.D.   On: 08/23/2021 15:05   CT Head Wo Contrast  Result Date: 08/23/2021 CLINICAL DATA:  Mental status change, unknown cause EXAM: CT HEAD WITHOUT CONTRAST TECHNIQUE: Contiguous axial images were obtained from the base of the skull through the vertex without intravenous contrast. RADIATION DOSE REDUCTION: This exam was performed according to the departmental dose-optimization program which includes automated exposure control, adjustment of the mA and/or kV according to patient size and/or use of iterative reconstruction technique. COMPARISON:  None Available. FINDINGS: Brain: No evidence of acute infarction, hemorrhage, hydrocephalus, extra-axial collection or mass lesion/mass effect. Patchy white matter hypodensities, nonspecific but compatible with chronic microvascular ischemic disease. Vascular:  No hyperdense vessel identified. Skull: No acute fracture. Sinuses/Orbits: Clear sinuses.  No acute orbital findings. Other: No mastoid effusions. IMPRESSION: 1. No evidence of acute intracranial abnormality. 2. Chronic microvascular ischemic disease. Electronically Signed   By: Feliberto Harts M.D.   On: 08/23/2021 13:24   DG Chest Portable 1 View  Result Date: 08/23/2021 CLINICAL DATA:  ams EXAM: PORTABLE CHEST 1 VIEW COMPARISON:  February 27, 2021 FINDINGS: Mild cardiomegaly. Biapical pleural pulmonary scarring. There are some interstitial changes seen. No consolidation, pleural effusion or significant vascular congestion. Thoracic spondylosis. IMPRESSION: No active disease. Electronically Signed   By: Marjo Bicker M.D.   On: 08/23/2021 13:14    Procedures Procedures    Medications Ordered in ED Medications - No data to display  ED Course/ Medical Decision Making/ A&P                           Medical Decision Making Amount and/or Complexity of Data Reviewed Independent Historian: EMS Labs: ordered. Decision-making details documented in ED Course. Radiology: ordered and independent interpretation performed. Decision-making details documented in ED Course. ECG/medicine tests: ordered and independent interpretation performed. Decision-making details documented in ED Course.  Risk Prescription drug management. Decision regarding hospitalization.  Patient here with altered mental status, hypoxia and shortness of breath.  Last seen normal was at least 2 days ago.  She is oriented to person and place and follows some commands without focal deficits.  Vitals are stable, no distress will obtain labs including ABG to assess for CO2 retention.  ABG with some hypercarbia but mostly compensated. Placed on bipap with some improvement in mental status. CT Head negative. CXR without infiltrate, results reviewed and interpreted by me.   Labs otherwise reassuring. UA negative.  Mentation  improving on bipap per daughter at bedside.  Continue treatment for suspected COPD exacerbation with bronchodilators and steroids.  Will obtain MRI to r/o CVA when able.  Admission dw Dr. Joylene Igo.         Final Clinical Impression(s) / ED Diagnoses Final diagnoses:  Acute respiratory failure with hypoxia and  hypercarbia (HCC)  Altered mental status, unspecified altered mental status type    Rx / DC Orders ED Discharge Orders     None         Thales Knipple, Jeannett Senior, MD 08/23/21 1752

## 2021-08-23 NOTE — Assessment & Plan Note (Signed)
Appears to be multifactorial and related to acute on chronic hypercapnic respiratory failure rule out an acute stroke. Patient was noted to have hypercarbia on admission and was placed on BiPAP with improvement in her mental status. Follow-up results of MRI of the brain to rule out an acute stroke There was a concern for possible UTI but urine analysis is sterile 

## 2021-08-23 NOTE — H&P (Signed)
History and Physical    Patient: Valerie Bowman ZOX:[id number] DOB: 31-Oct-1935 DOA: 08/23/2021 DOS: the patient was seen and examined on 08/23/2021 PCP: Marylynn Pearson, MD  Patient coming from: Home  Chief Complaint:  Chief Complaint  Patient presents with   Altered Mental Status    Most of the history was obtained from patient's daughter at the bedside. HPI: Arriona Prest is a 86 y.o. female with medical history significant for COPD with chronic respiratory failure on 2 L of oxygen continuous, chronic diastolic dysfunction CHF, hypothyroidism, hypertension, urinary incontinence who was brought into the ER by EMS for evaluation of mental status changes. Patient's daughter states that over the last 2 days she has been increasingly confused and the night prior to her admission she had visual hallucinations.  She also had complaints of pain in her right hip.  Patient had a fall about a week ago and was evaluated in the ER, during that evaluation her complaints were left hip pain and imaging was negative for an acute fracture.  On the day of admission her daughter states that she had gone to the assisted living facility to check on her and when she arrived she was awake and alert but still confused.  She fell asleep and became unarousable and so EMS was called. Per ER provider patient was very lethargic when she arrived to ER and arterial blood gas showed hypercapnia with compensated respiratory acidosis and so she was placed on a BiPAP. During my evaluation she is more awake and alert. She continues to complain of pain in her right hip but denies any abdominal pain, no nausea, no vomiting, no changes in her bowel habits, no urinary frequency, no nocturia, no dysuria, no fever, no cough, no headache, no dizziness or lightheadedness.                 Review of Systems: As mentioned in the history of present illness. All other systems reviewed and are negative. Past Medical History:  Diagnosis Date    Acquired hypothyroidism    Chronic diastolic heart failure (HCC)    COPD (chronic obstructive pulmonary disease) ([place])    Essential hypertension    Hyperlipidemia    Urinary incontinence    No past surgical history on file. Social History:  reports that she quit smoking about 28 years ago. Her smoking use included cigarettes. She has a 30.00 pack-year smoking history. She has never used smokeless tobacco. She reports that she does not currently use alcohol. She reports that she does not use drugs.  Allergies  Allergen Reactions   Penicillins Other (See Comments)    Listed on MAR as allergy Unknown reaction    Sulfa Antibiotics Other (See Comments)    Listed on MAR as allergy Unknown reaction    No family history on file.  Prior to Admission medications   Medication Sig Start Date End Date Taking? Authorizing Provider  acetaminophen (TYLENOL) 500 MG tablet Take 1,000 mg by mouth 3 (three) times daily.   Yes [provider]  albuterol (PROVENTIL) (2.5 MG/3ML) 0.083% nebulizer solution Take 2.5 mg by nebulization 4 (four) times daily -  before meals and at bedtime.   Yes [provider]  amLODipine (NORVASC) 5 MG tablet Take 5 mg by mouth daily.   Yes [provider]  aspirin EC 81 MG tablet Take 81 mg by mouth daily.   Yes [provider]  cetirizine (ZYRTEC) 10 MG tablet Take 10 mg by mouth daily.   Yes  [provider]  clobetasol (TEMOVATE) 0.05 % external solution Apply 1 Application topically 2 (two) times daily as needed (itching).   Yes [provider]  diclofenac Sodium (VOLTAREN) 1 % GEL Apply 2 g topically every 6 (six) hours as needed (pain).   Yes [provider]  enalapril (VASOTEC) 20 MG tablet Take 20 mg by mouth 2 (two) times daily. Hold for SBP < 110   Yes [provider]  famotidine (PEPCID) 20 MG tablet Take 20 mg by mouth at bedtime.   Yes [provider]  Glycerin-Hypromellose-PEG 400  (ARTIFICIAL TEARS) 0.2-0.2-1 % SOLN Place 1 drop into both eyes 2 (two) times daily.   Yes [provider]  ibuprofen (ADVIL) 200 MG tablet Take 400 mg by mouth every 8 (eight) hours as needed (pain).   Yes [provider]  levothyroxine (SYNTHROID) 125 MCG tablet Take 125 mcg by mouth in the morning.   Yes [provider]  magnesium hydroxide (MILK OF MAGNESIA) 400 MG/5ML suspension Take 30 mLs by mouth 2 (two) times daily as needed (constipation).   Yes [provider]  Multiple Vitamins-Minerals (ALIVE WOMENS 50+ GUMMY PO) Take 1 Dose by mouth in the morning.   Yes [provider]  Phenazopyridine HCl (AZO URINARY PAIN RELIEF) 99.5 MG TABS Take 199 mg by mouth 3 (three) times daily as needed (urinary symptoms).   Yes [provider]  polyethylene glycol powder (GLYCOLAX/MIRALAX) 17 GM/SCOOP powder Take 17 g by mouth daily.   Yes [provider]  senna (SENOKOT) 8.6 MG TABS tablet Take 1 tablet by mouth 2 (two) times daily.   Yes [provider]  Tiotropium Bromide-Olodaterol 2.5-2.5 MCG/ACT AERS Inhale 2 puffs into the lungs in the morning.   Yes [provider]  albuterol (VENTOLIN HFA) 108 (90 Base) MCG/ACT inhaler Inhale 1 puff into the lungs every 6 (six) hours as needed for wheezing or shortness of breath. Patient not taking: Reported on 08/23/2021 03/01/21   Shawna Clamp, MD  HYDROcodone-acetaminophen [facility]) 10-325 MG tablet Take 0.5 tablets by mouth every 6 (six) hours as needed. Patient not taking: Reported on 08/23/2021 04/23/21   Blanchie Dessert, MD  metoprolol succinate (TOPROL-XL) 50 MG 24 hr tablet Take 50 mg by mouth daily. Take with or immediately following a meal. Patient not taking: Reported on 08/23/2021    [provider]    Physical Exam: Vitals:   08/23/21 1300 08/23/21 1336 08/23/21 1354 08/23/21 1657  BP: 139/66     Pulse: 68  67   Temp: 97.8 F (36.6 C)     TempSrc: Oral      SpO2: 96%  99% 100%  Weight:  90.7 kg    Height:  5' 6"  (1.676 m)     Physical Exam Vitals and nursing note reviewed.  Constitutional:      Appearance: Normal appearance.     Comments: On BiPAP  HENT:     Head: Normocephalic and atraumatic.     Nose: Nose normal.     Mouth/Throat:     Mouth: Mucous membranes are dry.  Eyes:     Pupils: Pupils are equal, round, and reactive to light.  Cardiovascular:     Rate and Rhythm: Normal rate and regular rhythm.  Pulmonary:     Effort: Pulmonary effort is normal.     Breath sounds: Normal breath sounds.  Abdominal:     General: Abdomen is flat. Bowel sounds are normal.     Palpations: Abdomen  is soft.  Musculoskeletal:        General: Normal range of motion.     Cervical back: Normal range of motion and neck supple.     Comments: Decreased range of motion right hip  Skin:    General: Skin is warm and dry.  Neurological:     General: No focal deficit present.     Mental Status: She is alert and oriented to person, place, and time.  Psychiatric:        Mood and Affect: Mood normal.        Behavior: Behavior normal.     Data Reviewed: Relevant notes from primary care and specialist visits, past discharge summaries as available in EHR, including Care Everywhere. Prior diagnostic testing as pertinent to current admission diagnoses Updated medications and problem lists for reconciliation ED course, including vitals, labs, imaging, treatment and response to treatment Triage notes, nursing and pharmacy notes and ED provider's notes Notable results as noted in HPI Reviewed.  TSH 5.15, sodium 138, potassium 4.3, chloride 93, bicarb 36, glucose 107, 13, creatinine 0.62, calcium 9.1, total protein 6.7, albumin 3.4, AST 20, ALT 16, alk phos 60, total bilirubin 0.7, BNP 171, ammonia less than 10, white count 6.0, hemoglobin 10.1, hematocrit 32.9 MCV 105.1, RDW 13.2, platelet count 173 Chest x-ray reviewed by me shows no acute cardiopulmonary  disease CT scan of the head without contrast shows no evidence of acute intracranial abnormality. Chronic microvascular ischemic disease. X-ray of the pelvis shows no recent fracture or dislocation is seen. Degenerative changes are noted in the right hip. Twelve-lead EKG reviewed by me shows sinus rhythm.  Anteroseptal infarct age indeterminate. There are no new results to review at this time.  Assessment and Plan: * Acute metabolic encephalopathy Appears to be multifactorial and related to acute on chronic hypercapnic respiratory failure rule out an acute stroke. Patient was noted to have hypercarbia on admission and was placed on BiPAP with improvement in her mental status. Follow-up results of MRI of the brain to rule out an acute stroke There was a concern for possible UTI but urine analysis is sterile  Acute on chronic respiratory failure with hypoxia and hypercapnia (HCC) Patient with a history of chronic respiratory failure usually on 2 L of oxygen continuous who presents to the ER for evaluation of mental status changes described as confusion and lethargy and was found to have hypercarbia. She is currently on BiPAP with improvement in her mental status Continue BiPAP as needed during the day and at bedtime Patient may need to be assessed for AVAPS prior to discharge   Chronic diastolic heart failure (HCC) Stable and not acutely exacerbated Hold enalapril and amlodipine for now  Essential hypertension Hold amlodipine and enalapril until an acute stroke is ruled out  Acquired hypothyroidism Stable Continue Synthroid  COPD (chronic obstructive pulmonary disease) ([place]) Not acutely exacerbated Continue as needed bronchodilator therapy as well as inhaled steroids Continue oxygen supplementation to maintain pulse oximetry greater than 92%      Advance Care Planning:   Code Status: DNR   Consults: None  Family Communication: Greater than 50% of time was spent discussing  patient's condition and plan of with her and her daughter at the bedside.  All questions and concerns have been addressed.  They verbalized understanding and agree with the plan.  CODE STATUS was discussed and she is a DNR  Severity of Illness: The appropriate patient status for this patient is INPATIENT. Inpatient status is judged to  be reasonable and necessary in order to provide the required intensity of service to ensure the patient's safety. The patient's presenting symptoms, physical exam findings, and initial radiographic and laboratory data in the context of their chronic comorbidities is felt to place them at high risk for further clinical deterioration. Furthermore, it is not anticipated that the patient will be medically stable for discharge from the hospital within 2 midnights of admission.   * I certify that at the point of admission it is my clinical judgment that the patient will require inpatient hospital care spanning beyond 2 midnights from the point of admission due to high intensity of service, high risk for further deterioration and high frequency of surveillance required.*  Author: Collier Bullock, MD 08/23/2021 5:17 PM  For on call review [email or web address].

## 2021-08-23 NOTE — Assessment & Plan Note (Signed)
Stable Continue Synthroid 

## 2021-08-23 NOTE — Assessment & Plan Note (Signed)
Appears to be multifactorial and related to acute on chronic hypercapnic respiratory failure rule out an acute stroke. Patient was noted to have hypercarbia on admission and was placed on BiPAP with improvement in her mental status. Follow-up results of MRI of the brain to rule out an acute stroke There was a concern for possible UTI but urine analysis is sterile

## 2021-08-23 NOTE — Assessment & Plan Note (Signed)
Hold amlodipine and enalapril until an acute stroke is ruled out

## 2021-08-23 NOTE — ED Notes (Signed)
Patient transported to MRI 

## 2021-08-23 NOTE — Assessment & Plan Note (Signed)
Patient with a history of chronic respiratory failure usually on 2 L of oxygen continuous who presents to the ER for evaluation of mental status changes described as confusion and lethargy and was found to have hypercarbia. She is currently on BiPAP with improvement in her mental status Continue BiPAP as needed during the day and at bedtime Patient may need to be assessed for AVAPS prior to discharge

## 2021-08-23 NOTE — ED Triage Notes (Signed)
Patient arrived in ED vie [facility].  Patient in wheelchair with reported AMA X 3 days. Patient on 2 L/M oxygen normally. Patient oriented on arrival.

## 2021-08-23 NOTE — Progress Notes (Signed)
Patient in no distress at this time. No need for bipap will change order to prn

## 2021-08-23 NOTE — Assessment & Plan Note (Signed)
Stable and not acutely exacerbated Hold enalapril and amlodipine for now

## 2021-08-24 ENCOUNTER — Inpatient Hospital Stay (HOSPITAL_COMMUNITY): Payer: Medicare Other

## 2021-08-24 DIAGNOSIS — J9622 Acute and chronic respiratory failure with hypercapnia: Secondary | ICD-10-CM

## 2021-08-24 DIAGNOSIS — E039 Hypothyroidism, unspecified: Secondary | ICD-10-CM

## 2021-08-24 DIAGNOSIS — R4182 Altered mental status, unspecified: Secondary | ICD-10-CM

## 2021-08-24 DIAGNOSIS — J9621 Acute and chronic respiratory failure with hypoxia: Secondary | ICD-10-CM

## 2021-08-24 DIAGNOSIS — G9341 Metabolic encephalopathy: Secondary | ICD-10-CM | POA: Diagnosis not present

## 2021-08-24 LAB — CBC
HCT: 29.1 % — ABNORMAL LOW (ref 36.0–46.0)
Hemoglobin: 9.3 g/dL — ABNORMAL LOW (ref 12.0–15.0)
MCH: 32.9 pg (ref 26.0–34.0)
MCHC: 32 g/dL (ref 30.0–36.0)
MCV: 102.8 fL — ABNORMAL HIGH (ref 80.0–100.0)
Platelets: 157 10*3/uL (ref 150–400)
RBC: 2.83 MIL/uL — ABNORMAL LOW (ref 3.87–5.11)
RDW: 13.1 % (ref 11.5–15.5)
WBC: 5 10*3/uL (ref 4.0–10.5)
nRBC: 0 % (ref 0.0–0.2)

## 2021-08-24 LAB — BLOOD GAS, ARTERIAL
Acid-Base Excess: 11.5 mmol/L — ABNORMAL HIGH (ref 0.0–2.0)
Acid-Base Excess: 13.5 mmol/L — ABNORMAL HIGH (ref 0.0–2.0)
Bicarbonate: 38.7 mmol/L — ABNORMAL HIGH (ref 20.0–28.0)
Bicarbonate: 39.8 mmol/L — ABNORMAL HIGH (ref 20.0–28.0)
Drawn by: 5268
O2 Saturation: 95.9 %
O2 Saturation: 95.9 %
Patient temperature: 37
Patient temperature: 36.5
pCO2 arterial: 61 mmHg — ABNORMAL HIGH (ref 32–48)
pCO2 arterial: 55 mmHg — ABNORMAL HIGH (ref 32–48)
pH, Arterial: 7.41 (ref 7.35–7.45)
pH, Arterial: 7.47 — ABNORMAL HIGH (ref 7.35–7.45)
pO2, Arterial: 91 mmHg (ref 83–108)
pO2, Arterial: 83 mmHg (ref 83–108)

## 2021-08-24 LAB — BASIC METABOLIC PANEL
Anion gap: 11 (ref 5–15)
BUN: 11 mg/dL (ref 8–23)
CO2: 35 mmol/L — ABNORMAL HIGH (ref 22–32)
Calcium: 8.9 mg/dL (ref 8.9–10.3)
Chloride: 92 mmol/L — ABNORMAL LOW (ref 98–111)
Creatinine, Ser: 0.68 mg/dL (ref 0.44–1.00)
GFR, Estimated: >60 mL/min (ref >60)
Glucose, Bld: 89 mg/dL (ref 70–99)
Potassium: 4.1 mmol/L (ref 3.5–5.1)
Sodium: 138 mmol/L (ref 135–145)

## 2021-08-24 LAB — GLUCOSE, CAPILLARY: Glucose-Capillary: 101 mg/dL — ABNORMAL HIGH (ref 70–99)

## 2021-08-24 MED ORDER — ACETAMINOPHEN 325 MG PO TABS
650.0000 mg | ORAL_TABLET | Freq: Four times a day (QID) | ORAL | Status: DC | PRN
  Administered 2021-08-25: 650 mg via ORAL
  Filled 2021-08-24: qty 2

## 2021-08-24 MED ORDER — LISINOPRIL 20 MG PO TABS
20.0000 mg | ORAL_TABLET | Freq: Two times a day (BID) | ORAL | Status: DC
  Administered 2021-08-24 – 2021-08-26 (×5): 20 mg via ORAL
  Filled 2021-08-24 (×5): qty 1

## 2021-08-24 MED ORDER — LEVOTHYROXINE SODIUM 75 MCG PO TABS
150.0000 ug | ORAL_TABLET | Freq: Every day | ORAL | Status: DC
  Administered 2021-08-25 – 2021-08-26 (×2): 150 ug via ORAL
  Filled 2021-08-24 (×2): qty 2

## 2021-08-24 MED ORDER — ALBUTEROL SULFATE (2.5 MG/3ML) 0.083% IN NEBU
2.5000 mg | INHALATION_SOLUTION | Freq: Two times a day (BID) | RESPIRATORY_TRACT | Status: DC
  Administered 2021-08-24 – 2021-08-25 (×2): 2.5 mg via RESPIRATORY_TRACT
  Filled 2021-08-24 (×2): qty 3

## 2021-08-24 MED ORDER — DEXTROSE IN LACTATED RINGERS 5 % IV SOLN: INTRAVENOUS | Status: DC

## 2021-08-24 NOTE — Progress Notes (Signed)
PROGRESS NOTE    Valerie Bowman  VVO:[id number] DOB: December 15, 1935 DOA: 08/23/2021 PCP: Herschel Senegal, MD   Chief Complaint  Patient presents with   Altered Mental Status    Brief Narrative:    Valerie Bowman is a 86 y.o. female with medical history significant for COPD with chronic respiratory failure on 2 L of oxygen continuous, chronic diastolic dysfunction CHF, hypothyroidism, hypertension, urinary incontinence who was brought into the ER by EMS for evaluation of mental status changes. Patient's daughter states that over the last 2 days she has been increasingly confused and the night prior to her admission she had visual hallucinations.  She also had complaints of pain in her right hip.  Patient had a fall about a week ago and was evaluated in the ER, during that evaluation her complaints were left hip pain and imaging was negative for an acute fracture.  On the day of admission her daughter states that she had gone to the assisted living facility to check on her and when she arrived she was awake and alert but still confused.  She fell asleep and became unarousable and so EMS was called. Per ER provider patient was very lethargic when she arrived to ER and arterial blood gas showed hypercapnia with compensated respiratory acidosis and so she was placed on a BiPAP. During my evaluation she is more awake and alert. She continues to complain of pain in her right hip but denies any abdominal pain, no nausea, no vomiting, no changes in her bowel habits, no urinary frequency, no nocturia, no dysuria, no fever, no cough, no headache, no dizziness or lightheadedness.                 Assessment & Plan:   Principal Problem:   Acute metabolic encephalopathy Active Problems:   Acute on chronic respiratory failure with hypoxia and hypercapnia (HCC)   COPD (chronic obstructive pulmonary disease) (HCC)   Acquired hypothyroidism   Essential hypertension   Chronic diastolic heart failure (HCC)  Acute  metabolic encephalopathy -This appears to be multifactorial - may be related to her recently starting Cymbalta . -As well with evidence of hypercapnia, but this appears to be compensated -As well her TSH is elevated, which indicates some degree of hypothyroidism. -Still fluctuating, patient was waking up this morning, but he does report she is feeling sleepy and tired, as well she was more somnolent later in the day upon PT evaluation. -MRI brain with no acute ischemic event. -No evidence of infection.    Acute on chronic respiratory failure with hypoxia and hypercapnia (HCC) -She did require BiPAP yesterday with improvement of mental status. -ABG this morning showing compensated chronic hypercapnia -will  watch off BiPAP.  Dysphagia -Reports from facility plan was for MBS, as discussed with PT patient had a choking episode earlier today, will request SLP.   Chronic diastolic heart failure (HCC) Stable and not acutely exacerbated Hold enalapril and amlodipine for now   Essential hypertension Continue with home medications   Acquired hypothyroidism TSH is elevated at 8, Synthroid has been increased from 125 to 150 mcg.   COPD (chronic obstructive pulmonary disease) (HCC) Not acutely exacerbated Continue as needed bronchodilator therapy as well as inhaled steroids Continue oxygen supplementation to maintain pulse oximetry greater than 92%          DVT prophylaxis: Lovenox Code Status: DNR Family Communication: D/W daughter by phone Disposition:   Status is: Inpatient Remains inpatient appropriate because: remains altered, not back to baseline  Consultants:  none  Subjective:  No significant events overnight as discussed with the patient, she still reports she is feeling tired, sleepy and exhausted, notified by PT patient had a coughing spell while eating breakfast.  Objective: Vitals:   08/24/21 0400 08/24/21 0747 08/24/21 0800 08/24/21 0801  BP: 127/66 127/66  (!) 133/56   Pulse: 67 62 65   Resp: 19 18 16    Temp: 98 F (36.7 C) 98 F (36.7 C)    TempSrc: Oral Oral    SpO2: 95% 95% 95% 95%  Weight:      Height:        Intake/Output Summary (Last 24 hours) at 08/24/2021 1104 Last data filed at 08/24/2021 0700 Gross per 24 hour  Intake 745.14 ml  Output 600 ml  Net 145.14 ml   Filed Weights   08/23/21 1336 08/23/21 2000  Weight: 90.7 kg 95.3 kg    Examination:  Patient is somnolent, but wakes up and answer questions appropriately, frail Symmetrical Chest wall movement, Good air movement bilaterally, CTAB RRR,No Gallops,Rubs or new Murmurs, No Parasternal Heave +ve B.Sounds, Abd Soft, No tenderness, No rebound - guarding or rigidity. No Cyanosis, Clubbing or edema, No new Rash or bruise      Data Reviewed: I have personally reviewed following labs and imaging studies  CBC: Recent Labs  Lab 08/23/21 1337 08/23/21 1340 08/24/21 0622  WBC  --  6.0 5.0  NEUTROABS  --  4.3  --   HGB 11.2* 10.1* 9.3*  HCT 33.0* 32.9* 29.1*  MCV  --  105.1* 102.8*  PLT  --  173 [id number]    Basic Metabolic Panel: Recent Labs  Lab 08/23/21 1337 08/23/21 1340 08/24/21 0622  NA 134* 138 138  K 4.4 4.3 4.1  CL  --  93* 92*  CO2  --  36* 35*  GLUCOSE  --  107* 89  BUN  --  13 11  CREATININE  --  0.62 0.68  CALCIUM  --  9.1 8.9    GFR: Estimated Creatinine Clearance: 62 mL/min (by C-G formula based on SCr of 0.68 mg/dL).  Liver Function Tests: Recent Labs  Lab 08/23/21 1340  AST 20  ALT 16  ALKPHOS 60  BILITOT 0.7  PROT 6.7  ALBUMIN 3.4*    CBG: No results for input(s): "GLUCAP" in the last 168 hours.   No results found for this or any previous visit (from the past 240 hour(s)).       Radiology Studies: MR BRAIN WO CONTRAST  Result Date: 08/23/2021 CLINICAL DATA:  Provided history: Neuro deficit, acute, stroke suspected. EXAM: MRI HEAD WITHOUT CONTRAST TECHNIQUE: Multiplanar, multiecho pulse sequences of the brain and  surrounding structures were obtained without intravenous contrast. COMPARISON:  Head CT 08/23/2021. FINDINGS: Intermittently motion degraded exam, limiting evaluation. Most notably, there is moderate to severe motion degradation of the axial T2 TSE sequence, moderate to severe motion degradation of the least motion degraded axial T2 FLAIR sequence, moderate to severe motion degradation of the axial T2 GRE sequence, moderate motion degradation of the coronal T2 TSE sequence and moderate motion degradation of the axial T1 weighted sequence. Brain: Mild generalized cerebral atrophy. Mild T2 FLAIR hyperintense signal abnormality within the periventricular cerebral white matter, nonspecific but compatible with chronic small vessel ischemic disease. Small focus of T2* signal loss within the medial left frontal lobe which may reflect a developmental venous anomaly, focus mineralization or chronic microhemorrhage. There is no acute infarct. Within the limitations of motion  degradation, no intracranial mass or extra-axial fluid collection is identified. No midline shift. Vascular: Maintained flow voids within the proximal large arterial vessels. Skull and upper cervical spine: No focal suspicious marrow lesion. Incompletely assessed cervical spondylosis. Sinuses/Orbits: No mass or acute finding within the imaged orbits. Prior bilateral ocular lens replacement. Small fluid level within the right sphenoid sinus. IMPRESSION: Significantly motion degraded examination, as described and limiting evaluation. The diffusion-weighted imaging is of good quality. No evidence of acute infarct. Mild chronic small vessel ischemic changes within the cerebral white matter. Small focus of T2* signal loss within the medial left frontal lobe which may reflect a developmental venous anomaly, chronic microhemorrhage or focus of mineralization. Mild generalized cerebral atrophy. Right sphenoid sinusitis. Electronically Signed   By: Jackey Loge  D.O.   On: 08/23/2021 18:38   DG Pelvis Portable  Result Date: 08/23/2021 CLINICAL DATA:  Right hip pain EXAM: PORTABLE PELVIS 1-2 VIEWS COMPARISON:  02/20/2021 FINDINGS: No displaced fracture or dislocation is seen. Small bony spurs seen in the right hip. Bony spurs are noted in the pubic symphysis. Degenerative changes are noted in the visualized lower lumbar spine. IMPRESSION: No recent fracture or dislocation is seen. Degenerative changes are noted in the right hip. Electronically Signed   By: Ernie Avena M.D.   On: 08/23/2021 15:05   CT Head Wo Contrast  Result Date: 08/23/2021 CLINICAL DATA:  Mental status change, unknown cause EXAM: CT HEAD WITHOUT CONTRAST TECHNIQUE: Contiguous axial images were obtained from the base of the skull through the vertex without intravenous contrast. RADIATION DOSE REDUCTION: This exam was performed according to the departmental dose-optimization program which includes automated exposure control, adjustment of the mA and/or kV according to patient size and/or use of iterative reconstruction technique. COMPARISON:  None Available. FINDINGS: Brain: No evidence of acute infarction, hemorrhage, hydrocephalus, extra-axial collection or mass lesion/mass effect. Patchy white matter hypodensities, nonspecific but compatible with chronic microvascular ischemic disease. Vascular: No hyperdense vessel identified. Skull: No acute fracture. Sinuses/Orbits: Clear sinuses.  No acute orbital findings. Other: No mastoid effusions. IMPRESSION: 1. No evidence of acute intracranial abnormality. 2. Chronic microvascular ischemic disease. Electronically Signed   By: Feliberto Harts M.D.   On: 08/23/2021 13:24   DG Chest Portable 1 View  Result Date: 08/23/2021 CLINICAL DATA:  ams EXAM: PORTABLE CHEST 1 VIEW COMPARISON:  February 27, 2021 FINDINGS: Mild cardiomegaly. Biapical pleural pulmonary scarring. There are some interstitial changes seen. No consolidation, pleural effusion  or significant vascular congestion. Thoracic spondylosis. IMPRESSION: No active disease. Electronically Signed   By: Marjo Bicker M.D.   On: 08/23/2021 13:14        Scheduled Meds:  acetaminophen  1,000 mg Oral TID   albuterol  2.5 mg Nebulization TID   amLODipine  5 mg Oral Daily   arformoterol  15 mcg Nebulization BID   And   umeclidinium bromide  1 puff Inhalation Daily   aspirin EC  81 mg Oral Daily   enoxaparin (LOVENOX) injection  40 mg Subcutaneous Q24H   famotidine  20 mg Oral QHS   ipratropium-albuterol  3 mL Nebulization Once   levothyroxine  125 mcg Oral q AM   lisinopril  20 mg Oral BID   multivitamin with minerals  1 tablet Oral Daily   polyethylene glycol  17 g Oral Daily   polyvinyl alcohol  1 drop Both Eyes BID   senna  1 tablet Oral BID   Continuous Infusions:   LOS: 1 day  Phillips Climes, MD [facility]   To contact the attending provider between 7A-7P or the covering provider during after hours 7P-7A, please log into the web site [email or web address] and access using universal [place] password for that web site. If you do not have the password, please call the hospital operator.  08/24/2021, 11:04 AM

## 2021-08-24 NOTE — Evaluation (Signed)
Clinical/Bedside Swallow Evaluation Patient Details  Name: Valerie Bowman MRN: [id number] Date of Birth: November 27, 1935  Today's Date: 08/24/2021 Time: SLP Start Time (ACUTE ONLY): 1355 SLP Stop Time (ACUTE ONLY): 1410 SLP Time Calculation (min) (ACUTE ONLY): 15 min  Past Medical History:  Past Medical History:  Diagnosis Date   Acquired hypothyroidism    Chronic diastolic heart failure (HCC)    COPD (chronic obstructive pulmonary disease) (HCC)    Essential hypertension    Hyperlipidemia    Urinary incontinence    Past Surgical History: History reviewed. No pertinent surgical history. HPI:  86 y.o. female brought to ED by EMS to evaluate MS changes. Dx acute metabolic encephalopathy, acute on chronic resp failure with hypoxia and hypercapnia. Per chart, OP MBS had been scheduled; pt has had some recent choking events. PMHx recent fall, COPD with chronic respiratory failure on 2 L of oxygen continuous, chronic diastolic dysfunction CHF, hypothyroidism, hypertension, urinary incontinence.    Assessment / Plan / Recommendation  Clinical Impression  Pt was extremely drowsy. Her daughter, [name], was at bedside, and reported more difficulty with swallowing recently.  SLP at [facility]F had recommended an MBS study.  Ms. Gouge stated her name when asked. She did not follow commands. She drank water from a straw with adequate oral seal and no s/s of aspiration. She could not be adequately awakened to take any other POs, so eval was terminated. D/W Dr. Randol Kern.  Recommend continuing current diet; will f/u when pt is alert/participatory. SLP Visit Diagnosis: Dysphagia, unspecified (R13.10)    Aspiration Risk    tba   Diet Recommendation   Regular solids, thin liquids  Medication Administration: Whole meds with puree    Other  Recommendations Oral Care Recommendations: Oral care BID    Recommendations for follow up therapy are one component of a multi-disciplinary discharge planning  process, led by the attending physician.  Recommendations may be updated based on patient status, additional functional criteria and insurance authorization.  Follow up Recommendations Other (comment) (tba)      Assistance Recommended at Discharge    Functional Status Assessment    Frequency and Duration min 2x/week  2 weeks       Prognosis Prognosis for Safe Diet Advancement: Good      Swallow Study   General Date of Onset: 08/23/21 HPI: 86 y.o. female brought to ED by EMS to evaluate MS changes. Dx acute metabolic encephalopathy, acute on chronic resp failure with hypoxia and hypercapnia. Per chart, OP MBS had been scheduled; pt has had some recent choking events. PMHx recent fall, COPD with chronic respiratory failure on 2 L of oxygen continuous, chronic diastolic dysfunction CHF, hypothyroidism, hypertension, urinary incontinence. Type of Study: Bedside Swallow Evaluation Diet Prior to this Study: Regular;Thin liquids Temperature Spikes Noted: No Respiratory Status: Nasal cannula History of Recent Intubation: No Behavior/Cognition: Lethargic/Drowsy Oral Cavity Assessment: Within Functional Limits Oral Care Completed by SLP: No Self-Feeding Abilities: Total assist Patient Positioning: Upright in bed Baseline Vocal Quality: Normal Volitional Cough: Cognitively unable to elicit Volitional Swallow: Unable to elicit    Oral/Motor/Sensory Function Overall Oral Motor/Sensory Function: Within functional limits   Ice Chips Ice chips: Not tested   Thin Liquid Thin Liquid: Within functional limits    Nectar Thick Nectar Thick Liquid: Not tested   Honey Thick Honey Thick Liquid: Not tested   Puree Puree: Not tested   Solid     Solid: Not tested      Blenda Mounts Laurice 08/24/2021,2:15 PM  Roslin Norwood L. Samson Frederic, MA CCC/SLP Clinical Specialist - Acute Care SLP Acute Rehabilitation Services Office number [phone]

## 2021-08-24 NOTE — Progress Notes (Signed)
OT Cancellation Note  Patient Details Name: Valerie Bowman MRN: [id number] DOB: 04/08/1935   Cancelled Treatment:    Reason Eval/Treat Not Completed: Patient's level of consciousness  Valerie Bowman 08/24/2021, 4:11 PM 08/24/2021  RP, OTR/L  Acute Rehabilitation Services  Office:  [phone]

## 2021-08-24 NOTE — Evaluation (Signed)
Physical Therapy Evaluation Patient Details Name: Valerie Bowman MRN: [id number] DOB: [birth year] Today's Date: 08/24/2021  History of Present Illness  Pt is an 86 y/o female admitted secondary to AMS. Thought to have acute metabolic encephalopathy. PMH includes COPD on 2L, HTN, and dCHF.  Clinical Impression  Pt admitted secondary to problem above with deficits below. Pt very lethargic this session and not opening eyes to any stimuli. Placed pt in chair position and washed face with cold wash cloth, however, pt remained lethargic. Did withdraw from deep nailbed pressure on feet, but did not open eyes. Per daughter, pt was at ALF and working with PT on ambulating with RW. Recommend resumption of PT at ALF pending progression and increased ability for participation. Will continue to follow acutely.        Recommendations for follow up therapy are one component of a multi-disciplinary discharge planning process, led by the attending physician.  Recommendations may be updated based on patient status, additional functional criteria and insurance authorization.  Follow Up Recommendations Other (comment) (Continued PT follow up at ALF pending progression)    Assistance Recommended at Discharge Frequent or constant Supervision/Assistance  Patient can return home with the following  A lot of help with walking and/or transfers;A lot of help with bathing/dressing/bathroom;Assistance with cooking/housework;Help with stairs or ramp for entrance;Assist for transportation;Direct supervision/assist for financial management;Direct supervision/assist for medications management    Equipment Recommendations None recommended by PT  Recommendations for Other Services       Functional Status Assessment Patient has had a recent decline in their functional status and demonstrates the ability to make significant improvements in function in a reasonable and predictable amount of time.     Precautions / Restrictions  Precautions Precautions: Fall Restrictions Weight Bearing Restrictions: No      Mobility  Bed Mobility Overal bed mobility: Needs Assistance             General bed mobility comments: Placed bed in chair position in attempts to increase alertness, however, pt remained very drowsy and did not open eyes. Washed pt's face with cold washcloth, however, pt remained very lethargic. Unsafe to attempt further mobility.    Transfers                        Ambulation/Gait                  Stairs            Wheelchair Mobility    Modified Rankin (Stroke Patients Only)       Balance                                             Pertinent Vitals/Pain Pain Assessment Pain Assessment: Faces Faces Pain Scale: No hurt    Home Living Family/patient expects to be discharged to:: Assisted living                 Home Equipment: Rolling Walker (2 wheels);Cane - single point;Shower seat - built in;Grab bars - toilet;Grab bars - tub/shower;Wheelchair - manual Additional Comments: [facility]    Prior Function Prior Level of Function : Needs assist             Mobility Comments: Has had to use WC most of the time secondary to falls. Needs assist with transfers. Has been working  with therapy to work on ambulation with RW ADLs Comments: Staff assists with bathing/dressing     Hand Dominance        Extremity/Trunk Assessment   Upper Extremity Assessment Upper Extremity Assessment: Defer to OT evaluation    Lower Extremity Assessment Lower Extremity Assessment: Difficult to assess due to impaired cognition (Able to withdraw some to painful stimuli. PROM seems to be [facility])       Communication   Communication: Other (comment) (not responding secondary to lethargy)  Cognition Arousal/Alertness: Lethargic Behavior During Therapy: Flat affect Overall Cognitive Status: Difficult to assess                                  General Comments: Pt very lethargic throughout. Placed pt in chair position in bed, washed face with cold washcloth and performed deep presure to nail bed and did not increase alertness. Pt did withdraw BLE to painful stimuli.        General Comments General comments (skin integrity, edema, etc.): Pt's daughter present    Exercises     Assessment/Plan    PT Assessment Patient needs continued PT services  PT Problem List Decreased strength;Decreased activity tolerance;Decreased balance;Decreased mobility;Decreased knowledge of use of DME;Decreased knowledge of precautions;Decreased safety awareness;Decreased cognition       PT Treatment Interventions Gait training;Functional mobility training;DME instruction;Therapeutic activities;Therapeutic exercise;Balance training;Patient/family education;Cognitive remediation    PT Goals (Current goals can be found in the Care Plan section)  Acute Rehab PT Goals Patient Stated Goal: for pt to get better per daughter PT Goal Formulation: With family Time For Goal Achievement: 09/07/21 Potential to Achieve Goals: Fair    Frequency Min 3X/week     Co-evaluation               AM-PAC PT "6 Clicks" Mobility  Outcome Measure Help needed turning from your back to your side while in a flat bed without using bedrails?: Total Help needed moving from lying on your back to sitting on the side of a flat bed without using bedrails?: Total Help needed moving to and from a bed to a chair (including a wheelchair)?: Total Help needed standing up from a chair using your arms (e.g., wheelchair or bedside chair)?: Total Help needed to walk in hospital room?: Total Help needed climbing 3-5 steps with a railing? : Total 6 Click Score: 6    End of Session   Activity Tolerance: Patient limited by lethargy Patient left: in bed;with call bell/phone within reach;with family/visitor present;with bed alarm set Nurse Communication: Mobility  status PT Visit Diagnosis: Other abnormalities of gait and mobility (R26.89);Muscle weakness (generalized) (M62.81);Difficulty in walking, not elsewhere classified (R26.2)    Time: 6378-5885 PT Time Calculation (min) (ACUTE ONLY): 18 min   Charges:   PT Evaluation $PT Eval Moderate Complexity: 1 Mod          Farley Ly, PT, DPT  Acute Rehabilitation Services  Office: [phone]   Lehman Prom 08/24/2021, 3:21 PM

## 2021-08-24 NOTE — Progress Notes (Signed)
STAT EEG complete - results pending. ? ?

## 2021-08-24 NOTE — Progress Notes (Signed)
  Transition of Care [facility]) Screening Note   Patient Details  Name: Valerie Bowman Date of Birth: [birth year]   [facility] of [facility]) CM/SW Contact:    Mearl Latin, LCSW Phone Number: 08/24/2021, 6:09 PM    Transition of Care Department [facility]) has reviewed patient who resides at [facility]F with 2L home O2. We will continue to monitor patient advancement through interdisciplinary progression rounds. If new patient transition needs arise, please place a TOC consult.

## 2021-08-24 NOTE — Procedures (Signed)
Patient Name: Valerie Bowman  MRN: [id number]  Epilepsy Attending: Charlsie Quest  Referring Physician/Provider: Starleen Arms, MD  Date: 08/24/2021 Duration: 24.27 mins  Patient history: 86yo F with ams. EEG to evaluate for seizure  Level of alertness: Awake  AEDs during EEG study: None  Technical aspects: This EEG study was done with scalp electrodes positioned according to the 10-20 International system of electrode placement. Electrical activity was acquired at a sampling rate of 500Hz  and reviewed with a high frequency filter of 70Hz  and a low frequency filter of 1Hz . EEG data were recorded continuously and digitally stored.   Description: EEG showed continuous generalized 3 to 6 Hz theta-delta slowing, at times with triphasic morphology. Hyperventilation and photic stimulation were not performed.     ABNORMALITY - Continuous slow, generalized  IMPRESSION: This study is suggestive of moderate diffuse encephalopathy, nonspecific etiology but could be secondary to toxic-metabolic causes. No seizures or definite epileptiform discharges were seen throughout the recording.  Valerie Bowman 

## 2021-08-25 DIAGNOSIS — I10 Essential (primary) hypertension: Secondary | ICD-10-CM | POA: Diagnosis not present

## 2021-08-25 DIAGNOSIS — J449 Chronic obstructive pulmonary disease, unspecified: Secondary | ICD-10-CM

## 2021-08-25 DIAGNOSIS — G9341 Metabolic encephalopathy: Secondary | ICD-10-CM | POA: Diagnosis not present

## 2021-08-25 DIAGNOSIS — E039 Hypothyroidism, unspecified: Secondary | ICD-10-CM | POA: Diagnosis not present

## 2021-08-25 LAB — BLOOD GAS, ARTERIAL
Acid-Base Excess: 13.9 mmol/L — ABNORMAL HIGH (ref 0.0–2.0)
Bicarbonate: 41.2 mmol/L — ABNORMAL HIGH (ref 20.0–28.0)
Drawn by: 404151
O2 Saturation: 94.9 %
Patient temperature: 36.6
pCO2 arterial: 61 mmHg — ABNORMAL HIGH (ref 32–48)
pH, Arterial: 7.44 (ref 7.35–7.45)
pO2, Arterial: 77 mmHg — ABNORMAL LOW (ref 83–108)

## 2021-08-25 LAB — CBC
HCT: 30.8 % — ABNORMAL LOW (ref 36.0–46.0)
Hemoglobin: 9.8 g/dL — ABNORMAL LOW (ref 12.0–15.0)
MCH: 32.3 pg (ref 26.0–34.0)
MCHC: 31.8 g/dL (ref 30.0–36.0)
MCV: 101.7 fL — ABNORMAL HIGH (ref 80.0–100.0)
Platelets: 165 10*3/uL (ref 150–400)
RBC: 3.03 MIL/uL — ABNORMAL LOW (ref 3.87–5.11)
RDW: 13.1 % (ref 11.5–15.5)
WBC: 5.3 10*3/uL (ref 4.0–10.5)
nRBC: 0 % (ref 0.0–0.2)

## 2021-08-25 LAB — BASIC METABOLIC PANEL
Anion gap: 7 (ref 5–15)
BUN: 12 mg/dL (ref 8–23)
CO2: 34 mmol/L — ABNORMAL HIGH (ref 22–32)
Calcium: 8.7 mg/dL — ABNORMAL LOW (ref 8.9–10.3)
Chloride: 93 mmol/L — ABNORMAL LOW (ref 98–111)
Creatinine, Ser: 0.75 mg/dL (ref 0.44–1.00)
GFR, Estimated: >60 mL/min (ref >60)
Glucose, Bld: 114 mg/dL — ABNORMAL HIGH (ref 70–99)
Potassium: 4 mmol/L (ref 3.5–5.1)
Sodium: 134 mmol/L — ABNORMAL LOW (ref 135–145)

## 2021-08-25 LAB — FOLATE: Folate: 23.1 ng/mL (ref >5.9)

## 2021-08-25 LAB — VITAMIN B12: Vitamin B-12: 295 pg/mL (ref 180–914)

## 2021-08-25 LAB — AMMONIA: Ammonia: 23 umol/L (ref 9–35)

## 2021-08-25 MED ORDER — CYANOCOBALAMIN 1000 MCG/ML IJ SOLN
1000.0000 ug | Freq: Every day | INTRAMUSCULAR | Status: DC
Start: 2021-08-25 — End: 2021-08-26
  Administered 2021-08-25 – 2021-08-26 (×2): 1000 ug via INTRAMUSCULAR
  Filled 2021-08-25 (×2): qty 1

## 2021-08-25 MED ORDER — ALBUTEROL SULFATE (2.5 MG/3ML) 0.083% IN NEBU: 2.5000 mg | INHALATION_SOLUTION | Freq: Four times a day (QID) | RESPIRATORY_TRACT | Status: DC

## 2021-08-25 MED ORDER — ALBUTEROL SULFATE (2.5 MG/3ML) 0.083% IN NEBU
2.5000 mg | INHALATION_SOLUTION | Freq: Four times a day (QID) | RESPIRATORY_TRACT | Status: DC
  Administered 2021-08-25 – 2021-08-26 (×2): 2.5 mg via RESPIRATORY_TRACT
  Filled 2021-08-25 (×2): qty 3

## 2021-08-25 NOTE — Progress Notes (Signed)
Speech Language Pathology Treatment: Dysphagia  Patient Details Name: Valerie Bowman MRN: [id number] DOB: 04-Feb-1936 Today's Date: 08/25/2021 Time: 1230-1300 SLP Time Calculation (min) (ACUTE ONLY): 30 min  Assessment / Plan / Recommendation Clinical Impression  [name] was alert and interactive today, sitting in recliner. We discussed her recent hx of coughing with POs. She described intermittent globus as well as unpredictable coughing spells that accompany eating. She stated neither solids nor liquids were exclusive triggers of coughing. Full oral motor assessment was unremarkable. She consumed sips of thin liquids and ate half an egg salad sandwich. Intermittent coughing occurred but could not be correlated with thins liquids or solid consumption.  Unable to discern etiology at bedside.  Recommend that pt be scheduled for both an OP MBS (schedule this weekend will not allow for MBS while inpatient) and an esophagram.  Discussed recs with pt, her dtr Valerie Bowman and Dr. Waldron Labs.  They will call her PCP upon D/C. Recommend that she continue eating her regular diet/thin liquids, exercising caution (slow pace, small bites).    Pt/daughter agree with plan.   Will sign off and await OP consult.   HPI HPI: 86 y.o. female brought to ED by EMS to evaluate MS changes. Dx acute metabolic encephalopathy, acute on chronic resp failure with hypoxia and hypercapnia. Per chart, OP MBS had been scheduled; pt has had some recent choking events. PMHx recent fall, COPD with chronic respiratory failure on 2 L of oxygen continuous, chronic diastolic dysfunction CHF, hypothyroidism, hypertension, urinary incontinence.      SLP Plan  All goals met      Recommendations for follow up therapy are one component of a multi-disciplinary discharge planning process, led by the attending physician.  Recommendations may be updated based on patient status, additional functional criteria and insurance authorization.     Recommendations  Diet recommendations: Regular;Thin liquid Medication Administration: Whole meds with puree Supervision: Patient able to self feed                Oral Care Recommendations: Oral care BID Follow Up Recommendations: Outpatient SLP Assistance recommended at discharge: Intermittent Supervision/Assistance SLP Visit Diagnosis: Dysphagia, unspecified (R13.10) Plan: All goals met         Valerie Bowman L. Valerie Ringer, MA CCC/SLP Clinical Specialist - Acute Care SLP Acute Rehabilitation Services Office number [phone]   Valerie Bowman  08/25/2021, 1:31 PM

## 2021-08-25 NOTE — Progress Notes (Addendum)
PROGRESS NOTE    Valerie Bowman  ZOX:[id number] DOB: 1935-10-05 DOA: 08/23/2021 PCP: Herschel Senegal, MD   Chief Complaint  Patient presents with   Altered Mental Status    Brief Narrative:    Valerie Bowman is a 86 y.o. female with medical history significant for COPD with chronic respiratory failure on 2 L of oxygen continuous, chronic diastolic dysfunction CHF, hypothyroidism, hypertension, urinary incontinence who was brought into the ER by EMS for evaluation of mental status changes. Patient's daughter states that over the last 2 days she has been increasingly confused and the night prior to her admission she had visual hallucinations.  She also had complaints of pain in her right hip.  Patient had a fall about a week ago and was evaluated in the ER, during that evaluation her complaints were left hip pain and imaging was negative for an acute fracture.  On the day of admission her daughter states that she had gone to the assisted living facility to check on her and when she arrived she was awake and alert but still confused.  She fell asleep and became unarousable and so EMS was called. Per ER provider patient was very lethargic when she arrived to ER and arterial blood gas showed hypercapnia with compensated respiratory acidosis and so she was placed on a BiPAP. During my evaluation she is more awake and alert. She continues to complain of pain in her right hip but denies any abdominal pain, no nausea, no vomiting, no changes in her bowel habits, no urinary frequency, no nocturia, no dysuria, no fever, no cough, no headache, no dizziness or lightheadedness.                 Assessment & Plan:   Principal Problem:   Acute metabolic encephalopathy Active Problems:   Acute on chronic respiratory failure with hypoxia and hypercapnia (HCC)   COPD (chronic obstructive pulmonary disease) (HCC)   Acquired hypothyroidism   Essential hypertension   Chronic diastolic heart failure (HCC)  Acute  metabolic encephalopathy -This appears to be multifactorial - may be related to her recently starting Cymbalta . -As well with evidence of hypercapnia, but this appears to be compensated -As well her TSH is elevated, which indicates some degree of hypothyroidism. -Patient initially somnolent, but this has improved, she is more awake and appropriate today . -Continue to hold Cymbalta . -MRI brain with no acute ischemic event. -No evidence of infection. -Ammonia within normal limit -B12 borderline low at 295, will start on IM supplements during hospital stay, and p.o. on discharge    Acute on chronic respiratory failure with hypoxia and hypercapnia (HCC) -She did require BiPAP yesterday with improvement of mental status. -ABG this morning showing compensated chronic hypercapnia -will  watch off BiPAP.  Dysphagia -SLP input appreciated, recommendation for barium swallow and MBS as an outpatient   Chronic diastolic heart failure (HCC) Stable and not acutely exacerbated Hold enalapril and amlodipine for now   Essential hypertension Continue with home medications   Acquired hypothyroidism TSH is elevated at 8, Synthroid has been increased from 125 to 150 mcg.   COPD (chronic obstructive pulmonary disease) (HCC) Not acutely exacerbated Continue as needed bronchodilator therapy as well as inhaled steroids Continue oxygen supplementation to maintain pulse oximetry greater than 92%          DVT prophylaxis: Lovenox Code Status: DNR Family Communication: D/W daughter at bedside Disposition:   Status is: Inpatient Remains inpatient appropriate because: remains altered, not back to baseline  Consultants:  none  Subjective:  No significant events overnight, patient was awake and alert and appropriate since yesterday evening, she had a good dinner, communicative, she used BiPAP overnight.  Objective: Vitals:   08/25/21 0411 08/25/21 0426 08/25/21 0759 08/25/21 0800  BP:  (!) 129/56   (!) 129/47  Pulse: 65 62  64  Resp: 18 15  20   Temp: 97.8 F (36.6 C)   97.8 F (36.6 C)  TempSrc: Oral   Axillary  SpO2: 92% 93% 95% 95%  Weight:      Height:        Intake/Output Summary (Last 24 hours) at 08/25/2021 1430 Last data filed at 08/25/2021 0641 Gross per 24 hour  Intake 119.11 ml  Output 500 ml  Net -380.89 ml   Filed Weights   08/23/21 1336 08/23/21 2000  Weight: 90.7 kg 95.3 kg    Examination:  Awake Alert, Oriented X 3, get, hard of hearing  symmetrical Chest wall movement, Good air movement bilaterally, CTAB RRR,No Gallops,Rubs or new Murmurs, No Parasternal Heave +ve B.Sounds, Abd Soft, No tenderness, No rebound - guarding or rigidity. No Cyanosis, Clubbing or edema, No new Rash or bruise      Data Reviewed: I have personally reviewed following labs and imaging studies  CBC: Recent Labs  Lab 08/23/21 1337 08/23/21 1340 2021/08/25 0622 08/25/21 0220  WBC  --  6.0 5.0 5.3  NEUTROABS  --  4.3  --   --   HGB 11.2* 10.1* 9.3* 9.8*  HCT 33.0* 32.9* 29.1* 30.8*  MCV  --  105.1* 102.8* 101.7*  PLT  --  173 157 165    Basic Metabolic Panel: Recent Labs  Lab 08/23/21 1337 08/23/21 1340 08-25-2021 0622 08/25/21 0220  NA 134* 138 138 134*  K 4.4 4.3 4.1 4.0  CL  --  93* 92* 93*  CO2  --  36* 35* 34*  GLUCOSE  --  107* 89 114*  BUN  --  13 11 12   CREATININE  --  0.62 0.68 0.75  CALCIUM  --  9.1 8.9 8.7*    GFR: Estimated Creatinine Clearance: 62 mL/min (by C-G formula based on SCr of 0.75 mg/dL).  Liver Function Tests: Recent Labs  Lab 08/23/21 1340  AST 20  ALT 16  ALKPHOS 60  BILITOT 0.7  PROT 6.7  ALBUMIN 3.4*    CBG: Recent Labs  Lab August 25, 2021 1623  GLUCAP 101*     No results found for this or any previous visit (from the past 240 hour(s)).       Radiology Studies: EEG adult  Result Date: 08/25/21 08/26/21, MD     08/25/21  6:11 PM Patient Name: Valerie Bowman MRN: 08/26/2021 Epilepsy Attending:  Elpidio Eric Referring Physician/Provider: [id number], MD Date: 2021-08-25 Duration: 24.27 mins Patient history: 86yo F with ams. EEG to evaluate for seizure Level of alertness: Awake AEDs during EEG study: None Technical aspects: This EEG study was done with scalp electrodes positioned according to the 10-20 International system of electrode placement. Electrical activity was acquired at a sampling rate of 500Hz  and reviewed with a high frequency filter of 70Hz  and a low frequency filter of 1Hz . EEG data were recorded continuously and digitally stored. Description: EEG showed continuous generalized 3 to 6 Hz theta-delta slowing, at times with triphasic morphology. Hyperventilation and photic stimulation were not performed.   ABNORMALITY - Continuous slow, generalized IMPRESSION: This study is suggestive of moderate diffuse encephalopathy, nonspecific etiology  but could be secondary to toxic-metabolic causes. No seizures or definite epileptiform discharges were seen throughout the recording. Charlsie Quest   MR BRAIN WO CONTRAST  Result Date: 08/23/2021 CLINICAL DATA:  Provided history: Neuro deficit, acute, stroke suspected. EXAM: MRI HEAD WITHOUT CONTRAST TECHNIQUE: Multiplanar, multiecho pulse sequences of the brain and surrounding structures were obtained without intravenous contrast. COMPARISON:  Head CT 08/23/2021. FINDINGS: Intermittently motion degraded exam, limiting evaluation. Most notably, there is moderate to severe motion degradation of the axial T2 TSE sequence, moderate to severe motion degradation of the least motion degraded axial T2 FLAIR sequence, moderate to severe motion degradation of the axial T2 GRE sequence, moderate motion degradation of the coronal T2 TSE sequence and moderate motion degradation of the axial T1 weighted sequence. Brain: Mild generalized cerebral atrophy. Mild T2 FLAIR hyperintense signal abnormality within the periventricular cerebral white matter,  nonspecific but compatible with chronic small vessel ischemic disease. Small focus of T2* signal loss within the medial left frontal lobe which may reflect a developmental venous anomaly, focus mineralization or chronic microhemorrhage. There is no acute infarct. Within the limitations of motion degradation, no intracranial mass or extra-axial fluid collection is identified. No midline shift. Vascular: Maintained flow voids within the proximal large arterial vessels. Skull and upper cervical spine: No focal suspicious marrow lesion. Incompletely assessed cervical spondylosis. Sinuses/Orbits: No mass or acute finding within the imaged orbits. Prior bilateral ocular lens replacement. Small fluid level within the right sphenoid sinus. IMPRESSION: Significantly motion degraded examination, as described and limiting evaluation. The diffusion-weighted imaging is of good quality. No evidence of acute infarct. Mild chronic small vessel ischemic changes within the cerebral white matter. Small focus of T2* signal loss within the medial left frontal lobe which may reflect a developmental venous anomaly, chronic microhemorrhage or focus of mineralization. Mild generalized cerebral atrophy. Right sphenoid sinusitis. Electronically Signed   By: Jackey Loge D.O.   On: 08/23/2021 18:38   DG Pelvis Portable  Result Date: 08/23/2021 CLINICAL DATA:  Right hip pain EXAM: PORTABLE PELVIS 1-2 VIEWS COMPARISON:  02/20/2021 FINDINGS: No displaced fracture or dislocation is seen. Small bony spurs seen in the right hip. Bony spurs are noted in the pubic symphysis. Degenerative changes are noted in the visualized lower lumbar spine. IMPRESSION: No recent fracture or dislocation is seen. Degenerative changes are noted in the right hip. Electronically Signed   By: Ernie Avena M.D.   On: 08/23/2021 15:05        Scheduled Meds:  albuterol  2.5 mg Nebulization BID   amLODipine  5 mg Oral Daily   arformoterol  15 mcg  Nebulization BID   And   umeclidinium bromide  1 puff Inhalation Daily   aspirin EC  81 mg Oral Daily   enoxaparin (LOVENOX) injection  40 mg Subcutaneous Q24H   famotidine  20 mg Oral QHS   ipratropium-albuterol  3 mL Nebulization Once   levothyroxine  150 mcg Oral Q0600   lisinopril  20 mg Oral BID   multivitamin with minerals  1 tablet Oral Daily   polyethylene glycol  17 g Oral Daily   polyvinyl alcohol  1 drop Both Eyes BID   senna  1 tablet Oral BID   Continuous Infusions:  dextrose 5% lactated ringers 75 mL/hr at 08/25/21 0640     LOS: 2 days       Huey Bienenstock, MD [facility]   To contact the attending provider between 7A-7P or the covering provider during after  hours 7P-7A, please log into the web site [email or web address] and access using universal [place] password for that web site. If you do not have the password, please call the hospital operator.  08/25/2021, 2:30 PM

## 2021-08-25 NOTE — Evaluation (Signed)
Occupational Therapy Evaluation Patient Details Name: Valerie Bowman MRN: [id number] DOB: [birth year] Today's Date: 08/25/2021   History of Present Illness Pt is an 86 y/o female admitted secondary to AMS. Thought to have acute metabolic encephalopathy. PMH includes COPD on 2L, HTN, and dCHF.   Clinical Impression   Pt presents with decline in function and safety with ADLs and ADL mobility with impaired strength, balance and endurance. PTA pt lived at [facility] and was Mod I from w/c level with mobility (was using rollater prior to last fall) and requires some assist from staff with ADLs; pt reports that she was Ind with toileting. Pt currently required min guard A with UB ADLs and grooming seated, mod - max A with LB ADLs and mod - min A with SPTs. Pt would benefit from acute OT services to address impairments to maximize level of function and safety      Recommendations for follow up therapy are one component of a multi-disciplinary discharge planning process, led by the attending physician.  Recommendations may be updated based on patient status, additional functional criteria and insurance authorization.   Follow Up Recommendations  Home health OT    Assistance Recommended at Discharge Frequent or constant Supervision/Assistance  Patient can return home with the following A little help with bathing/dressing/bathroom;A little help with walking and/or transfers;Assist for transportation;Help with stairs or ramp for entrance    Functional Status Assessment  Patient has had a recent decline in their functional status and demonstrates the ability to make significant improvements in function in a reasonable and predictable amount of time.  Equipment Recommendations  Tub/shower seat    Recommendations for Other Services       Precautions / Restrictions Precautions Precautions: Fall Restrictions Weight Bearing Restrictions: No      Mobility Bed Mobility Overal bed mobility:  Needs Assistance Bed Mobility: Supine to Sit     Supine to sit: Min guard          Transfers Overall transfer level: Needs assistance Equipment used: Rolling walker (2 wheels) Transfers: Sit to/from Stand, Bed to chair/wheelchair/BSC Sit to Stand: Mod assist Stand pivot transfers: Min assist         General transfer comment: cues for correct hand/foot placement, needed to power up to RW      Balance Overall balance assessment: Independent, Needs assistance Sitting-balance support: No upper extremity supported, Feet unsupported Sitting balance-Leahy Scale: Good   Postural control: Posterior lean Standing balance support: Bilateral upper extremity supported, During functional activity Standing balance-Leahy Scale: Poor                             ADL either performed or assessed with clinical judgement   ADL Overall ADL's : Needs assistance/impaired Eating/Feeding: Independent   Grooming: Wash/dry hands;Wash/dry face;Min guard;Sitting   Upper Body Bathing: Min guard;Sitting   Lower Body Bathing: Moderate assistance;Sitting/lateral leans   Upper Body Dressing : Min guard;Sitting   Lower Body Dressing: Maximal assistance   Toilet Transfer: Moderate assistance;Minimal assistance;Stand-pivot;Cueing for safety;Cueing for sequencing;Rolling walker (2 wheels);BSC/3in1   Toileting- Clothing Manipulation and Hygiene: Maximal assistance;Sit to/from stand       Functional mobility during ADLs: Moderate assistance;Minimal assistance;Cueing for safety;Cueing for sequencing;Rolling walker (2 wheels)       Vision Baseline Vision/History: 1 Wears glasses Ability to See in Adequate Light: 0 Adequate Patient Visual Report: No change from baseline       Perception  Praxis      Pertinent Vitals/Pain Pain Assessment Pain Assessment: No/denies pain Faces Pain Scale: No hurt     Hand Dominance Right   Extremity/Trunk Assessment Upper Extremity  Assessment Upper Extremity Assessment: Generalized weakness   Lower Extremity Assessment Lower Extremity Assessment: Defer to PT evaluation       Communication Communication Communication: No difficulties   Cognition Arousal/Alertness: Awake/alert Behavior During Therapy: WFL for tasks assessed/performed Overall Cognitive Status: Within Functional Limits for tasks assessed                                       General Comments       Exercises     Shoulder Instructions      Home Living Family/patient expects to be discharged to:: Assisted living                             Home Equipment: Rolling Walker (2 wheels);Cane - single point;Shower seat - built in;Grab bars - toilet;Grab bars - tub/shower;Wheelchair - manual   Additional Comments: [facility]      Prior Functioning/Environment Prior Level of Function : Needs assist             Mobility Comments: Has had to use WC most of the time secondary to falls. Needs assist with transfers. Has been working with therapy to work on ambulation with RW ADLs Comments: Staff assists with bathing/dressing as needed per pt        OT Problem List: Decreased strength;Impaired balance (sitting and/or standing);Decreased activity tolerance;Decreased coordination;Decreased knowledge of use of DME or AE      OT Treatment/Interventions: Self-care/ADL training;DME and/or AE instruction;Therapeutic activities;Balance training;Therapeutic exercise;Neuromuscular education;Patient/family education    OT Goals(Current goals can be found in the care plan section) Acute Rehab OT Goals Patient Stated Goal: "get better" OT Goal Formulation: With patient/family Time For Goal Achievement: 09/08/21 Potential to Achieve Goals: Good ADL Goals Pt Will Perform Grooming: with min guard assist;with supervision;standing Pt Will Perform Upper Body Bathing: with supervision;with set-up;sitting Pt Will Perform  Lower Body Bathing: with min guard assist;sitting/lateral leans;sit to/from stand Pt Will Perform Upper Body Dressing: with supervision;with set-up;sitting Pt Will Perform Lower Body Dressing: with mod assist;with min assist;sitting/lateral leans;sit to/from stand Pt Will Transfer to Toilet: with min assist;with min guard assist;ambulating;stand pivot transfer;regular height toilet;bedside commode;grab bars Pt Will Perform Toileting - Clothing Manipulation and hygiene: with min assist;with min guard assist;sit to/from stand  OT Frequency: Min 2X/week    Co-evaluation              AM-PAC OT "6 Clicks" Daily Activity     Outcome Measure Help from another person eating meals?: None Help from another person taking care of personal grooming?: A Little Help from another person toileting, which includes using toliet, bedpan, or urinal?: A Lot Help from another person bathing (including washing, rinsing, drying)?: A Lot Help from another person to put on and taking off regular upper body clothing?: A Little Help from another person to put on and taking off regular lower body clothing?: A Lot 6 Click Score: 16   End of Session Equipment Utilized During Treatment: Gait belt;Rolling walker (2 wheels) Nurse Communication: Mobility status  Activity Tolerance: Patient tolerated treatment well Patient left: in chair;with call bell/phone within reach;with family/visitor present  OT Visit Diagnosis: Unsteadiness on feet (R26.81);Other  abnormalities of gait and mobility (R26.89);History of falling (Z91.81);Muscle weakness (generalized) (M62.81)                Time: 0315-9458 OT Time Calculation (min): 31 min Charges:  OT General Charges $OT Visit: 1 Visit OT Evaluation $OT Eval Moderate Complexity: 1 Mod OT Treatments $Therapeutic Activity: 8-22 mins   Galen Manila 08/25/2021, 12:25 PM

## 2021-08-26 DIAGNOSIS — G9341 Metabolic encephalopathy: Secondary | ICD-10-CM | POA: Diagnosis not present

## 2021-08-26 DIAGNOSIS — E039 Hypothyroidism, unspecified: Secondary | ICD-10-CM | POA: Diagnosis not present

## 2021-08-26 DIAGNOSIS — J9621 Acute and chronic respiratory failure with hypoxia: Secondary | ICD-10-CM | POA: Diagnosis not present

## 2021-08-26 DIAGNOSIS — J9622 Acute and chronic respiratory failure with hypercapnia: Secondary | ICD-10-CM | POA: Diagnosis not present

## 2021-08-26 MED ORDER — ALBUTEROL SULFATE (2.5 MG/3ML) 0.083% IN NEBU
2.5000 mg | INHALATION_SOLUTION | Freq: Three times a day (TID) | RESPIRATORY_TRACT | Status: DC
Start: 2021-08-26 — End: 2021-08-26

## 2021-08-26 MED ORDER — LEVOTHYROXINE SODIUM 150 MCG PO TABS: 150.0000 ug | ORAL_TABLET | Freq: Every day | ORAL | 1 refills | Status: DC

## 2021-08-26 MED ORDER — CYANOCOBALAMIN 500 MCG PO TABS: 500.0000 ug | ORAL_TABLET | Freq: Every day | ORAL | Status: DC

## 2021-08-26 NOTE — Progress Notes (Signed)
CSW spoke with Hassel Neth, Emiliano Dyer is who CSW spoke with. The Pt was transported by the daughter. CSW gave the Pt daughter FL2 as well DCS

## 2021-08-26 NOTE — Progress Notes (Signed)
Report called to [facility], followed SBAR. IV removal well tolerated, daughter to transport.

## 2021-08-26 NOTE — [place] FL2 (Signed)
[place] MEDICAID FL2 LEVEL OF CARE SCREENING TOOL     IDENTIFICATION  Patient Name: Valerie Bowman Birthdate: 07/07/1935 Sex: female Admission Date (Current Location): 08/23/2021  [facility] and [place] Number:  Producer, television/film/video and Address:  [facility] [place], [address], [place], [place] [address]      Provider Number: [id number]  Attending Physician Name and Address:  Starleen Arms, MD  Relative Name and Phone Number:  Buel Ream [phone]    Current Level of Care: Hospital Recommended Level of Care: Assisted Living Facility Prior Approval Number:    Date Approved/Denied:   PASRR Number:    Discharge Plan:  (ALF)    Current Diagnoses: Patient Active Problem List   Diagnosis Date Noted   Acute on chronic respiratory failure with hypoxia and hypercapnia (HCC) 08/23/2021   Acute metabolic encephalopathy 08/23/2021   Pain in right shoulder 03/20/2021   SOB (shortness of breath) 02/28/2021   Acquired hypothyroidism    Essential hypertension    Chronic diastolic heart failure (HCC)    COPD (chronic obstructive pulmonary disease) (HCC) 02/27/2021   Low back pain 02/20/2021    Orientation RESPIRATION BLADDER Height & Weight     Self, Place  O2 Incontinent, External catheter Weight: 210 lb 1.6 oz (95.3 kg) Height:  5\' 6"  (167.6 cm)  BEHAVIORAL SYMPTOMS/MOOD NEUROLOGICAL BOWEL NUTRITION STATUS      Continent  (See Discharge Summary)  AMBULATORY STATUS COMMUNICATION OF NEEDS Skin   Limited Assist   Normal                       Personal Care Assistance Level of Assistance  Bathing, Feeding, Dressing Bathing Assistance: Limited assistance Feeding assistance: Limited assistance Dressing Assistance: Limited assistance     Functional Limitations Info  Sight, Hearing, Speech Sight Info: Impaired Hearing Info: Adequate Speech Info: Adequate    SPECIAL CARE FACTORS FREQUENCY                       Contractures  Contractures Info: Not present    Additional Factors Info  Allergies Code Status Info: DNR Allergies Info: Penicillins, Sulfa Antibiotics           Discharge Medications: acetaminophen 500 MG tablet Commonly known as: TYLENOL Take 1,000 mg by mouth 3 (three) times daily.    albuterol (2.5 MG/3ML) 0.083% nebulizer solution Commonly known as: PROVENTIL Take 2.5 mg by nebulization 4 (four) times daily -  before meals and at bedtime.    albuterol 108 (90 Base) MCG/ACT inhaler Commonly known as: VENTOLIN HFA Inhale 1 puff into the lungs every 6 (six) hours as needed for wheezing or shortness of breath.    ALIVE WOMENS 50+ GUMMY PO Take 1 Dose by mouth in the morning.    amLODipine 5 MG tablet Commonly known as: NORVASC Take 5 mg by mouth daily.    Artificial Tears 0.2-0.2-1 % Soln Generic drug: Glycerin-Hypromellose-PEG 400 Place 1 drop into both eyes 2 (two) times daily.    aspirin EC 81 MG tablet Take 81 mg by mouth daily.    AZO Urinary Pain Relief 99.5 MG Tabs Generic drug: Phenazopyridine HCl Take 199 mg by mouth 3 (three) times daily as needed (urinary symptoms).    cetirizine 10 MG tablet Commonly known as: ZYRTEC Take 10 mg by mouth daily.    clobetasol 0.05 % external solution Commonly known as: TEMOVATE Apply 1 Application topically 2 (two) times daily  as needed (itching).    diclofenac Sodium 1 % Gel Commonly known as: VOLTAREN Apply 2 g topically every 6 (six) hours as needed (pain).    enalapril 20 MG tablet Commonly known as: VASOTEC Take 20 mg by mouth 2 (two) times daily. Hold for SBP < 110    famotidine 20 MG tablet Commonly known as: PEPCID Take 20 mg by mouth at bedtime.    ibuprofen 200 MG tablet Commonly known as: ADVIL Take 400 mg by mouth every 8 (eight) hours as needed (pain).    levothyroxine 150 MCG tablet Commonly known as: SYNTHROID Take 1 tablet (150 mcg total) by mouth daily at 6 (six) AM. Start taking on: August 27, 2021 What changed:  medication strength how much to take when to take this    Milk of Magnesia 400 MG/5ML suspension Generic drug: magnesium hydroxide Take 30 mLs by mouth 2 (two) times daily as needed (constipation).    polyethylene glycol powder 17 GM/SCOOP powder Commonly known as: GLYCOLAX/MIRALAX Take 17 g by mouth daily.    senna 8.6 MG Tabs tablet Commonly known as: SENOKOT Take 1 tablet by mouth 2 (two) times daily.    Tiotropium Bromide-Olodaterol 2.5-2.5 MCG/ACT Aers Inhale 2 puffs into the lungs in the morning.    vitamin B-12 500 MCG tablet Commonly known as: CYANOCOBALAMIN Take 1 tablet (500 mcg total) by mouth daily.            Relevant Imaging Results:  Relevant Lab Results:   Additional Information ss# [id number]  Georgie Chard, LCSW

## 2021-08-26 NOTE — Discharge Instructions (Signed)
Follow with Primary MD Herschel Senegal, MD /ALF physician in 3 days   Get CBC, CMP,  checked  by Primary MD next visit.    Activity: As tolerated with Full fall precautions use walker/cane & assistance as needed   [facility]   Diet: Heart Healthy    On your next visit with your primary care physician please Get Medicines reviewed and adjusted.   Please request your Prim.MD to go over all Hospital Tests and Procedure/Radiological results at the follow up, please get all Hospital records sent to your Prim MD by signing hospital release before you go home.   If you experience worsening of your admission symptoms, develop shortness of breath, life threatening emergency, suicidal or homicidal thoughts you must seek medical attention immediately by calling 911 or calling your MD immediately  if symptoms less severe.  You Must read complete instructions/literature along with all the possible adverse reactions/side effects for all the Medicines you take and that have been prescribed to you. Take any new Medicines after you have completely understood and accpet all the possible adverse reactions/side effects.   Do not drive, operating heavy machinery, perform activities at heights, swimming or participation in water activities or provide baby sitting services if your were admitted for syncope or siezures until you have seen by Primary MD or a Neurologist and advised to do so again.  Do not drive when taking Pain medications.    Do not take more than prescribed Pain, Sleep and Anxiety Medications  Special Instructions: If you have smoked or chewed Tobacco  in the last 2 yrs please stop smoking, stop any regular Alcohol  and or any Recreational drug use.  Wear Seat belts while driving.   Please note  You were cared for by a hospitalist during your hospital stay. If you have any questions about your discharge medications or the care you received while you were in the  hospital after you are discharged, you can call the unit and asked to speak with the hospitalist on call if the hospitalist that took care of you is not available. Once you are discharged, your primary care physician will handle any further medical issues. Please note that NO REFILLS for any discharge medications will be authorized once you are discharged, as it is imperative that you return to your primary care physician (or establish a relationship with a primary care physician if you do not have one) for your aftercare needs so that they can reassess your need for medications and monitor your lab values.

## 2021-08-26 NOTE — Discharge Summary (Signed)
Physician Discharge Summary  Valerie Bowman [id number] DOB: [birth year] DOA: 08/23/2021  PCP: Marylynn Pearson, MD  Admit date: 08/23/2021 Discharge date: 08/26/2021  Admitted From:  ALF Disposition:  ALF  Recommendations for Outpatient Follow-up:  Follow up with PCP in 1-2 weeks Please obtain BMP/CBC in one week Patient will need continuous followed by SLP, she will need esophagram.  and modified barium swallow as an outpatient. Please recheck free T4 and TSH in 4 to 6 weeks as her Synthroid dose has been adjusted Please Check B12 level in 4 to 6 weeks as well  Home Health:YES   Discharge Condition:Stable CODE STATUS:DNR Diet recommendation: Heart Healthy   Brief/Interim Summary:   Valerie Bowman is a 86 y.o. female with medical history significant for COPD with chronic respiratory failure on 2 L of oxygen continuous, chronic diastolic dysfunction CHF, hypothyroidism, hypertension, urinary incontinence who was brought into the ER by EMS for evaluation of mental status changes. Patient's daughter states that over the last 2 days she has been increasingly confused and the night prior to her admission she had visual hallucinations.  She also had complaints of pain in her right hip.  Patient had a fall about a week ago and was evaluated in the ER, during that evaluation her complaints were left hip pain and imaging was negative for an acute fracture.  On the day of admission her daughter states that she had gone to the assisted living facility to check on her and when she arrived she was awake and alert but still confused.  She fell asleep and became unarousable and so EMS was called. Per ER provider patient was very lethargic when she arrived to ER and arterial blood gas showed hypercapnia with compensated respiratory acidosis and so she was placed on a BiPAP. -Patient was admitted for further work-up, initially mental status has been fluctuating, goes from extreme lethargy to awake and confused, this  has improved in 24 hours, follow-up Athey felt secondary to Cymbalta, her CO2 appears to be compensated, rest of work-up was unrevealing, this morning she was more confused than her baseline which appears to be secondary to hospital delirium.                Acute metabolic encephalopathy -This appears to be multifactorial - may be related to her recently starting Cymbalta . -As well with evidence of hypercapnia, but this appears to be compensated -As well her TSH is elevated, which indicates some degree of hypothyroidism. -Patient initially somnolent, but this has improved, she is more awake and appropriate today . -Continue to hold Cymbalta . -MRI brain with no acute ischemic event. -No evidence of infection. -Ammonia within normal limit -B12 borderline low at 295, will start on IM supplements during hospital stay, and p.o. on discharge -Overall mentation has significantly improved, she was at baseline yesterday, this morning she is mildly confused due to hospital delirium, but overall she is appropriate, and stable for discharge back to facility, as discussed with daughter based management for delirium is for her to go back to her normal living environment.     Acute on chronic respiratory failure with hypoxia and hypercapnia (HCC) -Patient with elevated PCO2, but this appears to be compensated with normal pH, she was tried on BiPAP initially.     Dysphagia -SLP input appreciated, recommendation for esophagramand MBS as an outpatient   Chronic diastolic heart failure (HCC) Stable and not acutely exacerbated   Essential hypertension Continue with home medications   Acquired hypothyroidism TSH  is elevated at 8, Synthroid has been increased from 125 to 150 mcg.   COPD (chronic obstructive pulmonary disease) (HCC) Not acutely exacerbated Continue as needed bronchodilator therapy as well as inhaled steroids Continue oxygen supplementation to maintain pulse oximetry greater than 92%       Discharge Diagnoses:  Principal Problem:   Acute metabolic encephalopathy Active Problems:   Acute on chronic respiratory failure with hypoxia and hypercapnia (HCC)   COPD (chronic obstructive pulmonary disease) (HCC)   Acquired hypothyroidism   Essential hypertension   Chronic diastolic heart failure ([place])    Discharge Instructions  Discharge Instructions     Diet - low sodium heart healthy   Complete by: As directed    Discharge instructions   Complete by: As directed    Follow with Primary MD Marylynn Pearson, MD /ALF physician in 3 days   Get CBC, CMP,  checked  by Primary MD next visit.    Activity: As tolerated with Full fall precautions use walker/cane & assistance as needed   [facility]   Diet: Heart Healthy    On your next visit with your primary care physician please Get Medicines reviewed and adjusted.   Please request your Prim.MD to go over all Hospital Tests and Procedure/Radiological results at the follow up, please get all Hospital records sent to your Prim MD by signing hospital release before you go home.   If you experience worsening of your admission symptoms, develop shortness of breath, life threatening emergency, suicidal or homicidal thoughts you must seek medical attention immediately by calling 911 or calling your MD immediately  if symptoms less severe.  You Must read complete instructions/literature along with all the possible adverse reactions/side effects for all the Medicines you take and that have been prescribed to you. Take any new Medicines after you have completely understood and accpet all the possible adverse reactions/side effects.   Do not drive, operating heavy machinery, perform activities at heights, swimming or participation in water activities or provide baby sitting services if your were admitted for syncope or siezures until you have seen by Primary MD or a Neurologist and advised to do so again.  Do  not drive when taking Pain medications.    Do not take more than prescribed Pain, Sleep and Anxiety Medications  Special Instructions: If you have smoked or chewed Tobacco  in the last 2 yrs please stop smoking, stop any regular Alcohol  and or any Recreational drug use.  Wear Seat belts while driving.   Please note  You were cared for by a hospitalist during your hospital stay. If you have any questions about your discharge medications or the care you received while you were in the hospital after you are discharged, you can call the unit and asked to speak with the hospitalist on call if the hospitalist that took care of you is not available. Once you are discharged, your primary care physician will handle any further medical issues. Please note that NO REFILLS for any discharge medications will be authorized once you are discharged, as it is imperative that you return to your primary care physician (or establish a relationship with a primary care physician if you do not have one) for your aftercare needs so that they can reassess your need for medications and monitor your lab values.   Increase activity slowly   Complete by: As directed       Allergies as of 08/26/2021       Reactions  Penicillins Other (See Comments)   Listed on MAR as allergy Unknown reaction   Sulfa Antibiotics Other (See Comments)   Listed on MAR as allergy Unknown reaction        Medication List     STOP taking these medications    HYDROcodone-acetaminophen 10-325 MG tablet Commonly known as: NORCO   metoprolol succinate 50 MG 24 hr tablet Commonly known as: TOPROL-XL       TAKE these medications    acetaminophen 500 MG tablet Commonly known as: TYLENOL Take 1,000 mg by mouth 3 (three) times daily.   albuterol (2.5 MG/3ML) 0.083% nebulizer solution Commonly known as: PROVENTIL Take 2.5 mg by nebulization 4 (four) times daily -  before meals and at bedtime.   albuterol 108 (90 Base)  MCG/ACT inhaler Commonly known as: VENTOLIN HFA Inhale 1 puff into the lungs every 6 (six) hours as needed for wheezing or shortness of breath.   ALIVE WOMENS 50+ GUMMY PO Take 1 Dose by mouth in the morning.   amLODipine 5 MG tablet Commonly known as: NORVASC Take 5 mg by mouth daily.   Artificial Tears 0.2-0.2-1 % Soln Generic drug: Glycerin-Hypromellose-PEG 400 Place 1 drop into both eyes 2 (two) times daily.   aspirin EC 81 MG tablet Take 81 mg by mouth daily.   AZO Urinary Pain Relief 99.5 MG Tabs Generic drug: Phenazopyridine HCl Take 199 mg by mouth 3 (three) times daily as needed (urinary symptoms).   cetirizine 10 MG tablet Commonly known as: ZYRTEC Take 10 mg by mouth daily.   clobetasol 0.05 % external solution Commonly known as: TEMOVATE Apply 1 Application topically 2 (two) times daily as needed (itching).   diclofenac Sodium 1 % Gel Commonly known as: VOLTAREN Apply 2 g topically every 6 (six) hours as needed (pain).   enalapril 20 MG tablet Commonly known as: VASOTEC Take 20 mg by mouth 2 (two) times daily. Hold for SBP < 110   famotidine 20 MG tablet Commonly known as: PEPCID Take 20 mg by mouth at bedtime.   ibuprofen 200 MG tablet Commonly known as: ADVIL Take 400 mg by mouth every 8 (eight) hours as needed (pain).   levothyroxine 150 MCG tablet Commonly known as: SYNTHROID Take 1 tablet (150 mcg total) by mouth daily at 6 (six) AM. Start taking on: August 27, 2021 What changed:  medication strength how much to take when to take this   Milk of Magnesia 400 MG/5ML suspension Generic drug: magnesium hydroxide Take 30 mLs by mouth 2 (two) times daily as needed (constipation).   polyethylene glycol powder 17 GM/SCOOP powder Commonly known as: GLYCOLAX/MIRALAX Take 17 g by mouth daily.   senna 8.6 MG Tabs tablet Commonly known as: SENOKOT Take 1 tablet by mouth 2 (two) times daily.   Tiotropium Bromide-Olodaterol 2.5-2.5 MCG/ACT  Aers Inhale 2 puffs into the lungs in the morning.   vitamin B-12 500 MCG tablet Commonly known as: CYANOCOBALAMIN Take 1 tablet (500 mcg total) by mouth daily.        Allergies  Allergen Reactions   Penicillins Other (See Comments)    Listed on MAR as allergy Unknown reaction    Sulfa Antibiotics Other (See Comments)    Listed on MAR as allergy Unknown reaction    Consultations: None   Procedures/Studies: EEG adult  Result Date: 09/15/2021 Lora Havens, MD     09/15/21  6:11 PM Patient Name: Valerie Bowman MRN: [id number] Epilepsy Attending: Lora Havens Referring Physician/Provider: Phillips Climes  S, MD Date: 08/24/2021 Duration: 24.27 mins Patient history: 86yo F with ams. EEG to evaluate for seizure Level of alertness: Awake AEDs during EEG study: None Technical aspects: This EEG study was done with scalp electrodes positioned according to the 10-20 International system of electrode placement. Electrical activity was acquired at a sampling rate of 500Hz  and reviewed with a high frequency filter of 70Hz  and a low frequency filter of 1Hz . EEG data were recorded continuously and digitally stored. Description: EEG showed continuous generalized 3 to 6 Hz theta-delta slowing, at times with triphasic morphology. Hyperventilation and photic stimulation were not performed.   ABNORMALITY - Continuous slow, generalized IMPRESSION: This study is suggestive of moderate diffuse encephalopathy, nonspecific etiology but could be secondary to toxic-metabolic causes. No seizures or definite epileptiform discharges were seen throughout the recording.   MR BRAIN WO CONTRAST  Result Date: 08/23/2021 CLINICAL DATA:  Provided history: Neuro deficit, acute, stroke suspected. EXAM: MRI HEAD WITHOUT CONTRAST TECHNIQUE: Multiplanar, multiecho pulse sequences of the brain and surrounding structures were obtained without intravenous contrast. COMPARISON:  Head CT 08/23/2021. FINDINGS:  Intermittently motion degraded exam, limiting evaluation. Most notably, there is moderate to severe motion degradation of the axial T2 TSE sequence, moderate to severe motion degradation of the least motion degraded axial T2 FLAIR sequence, moderate to severe motion degradation of the axial T2 GRE sequence, moderate motion degradation of the coronal T2 TSE sequence and moderate motion degradation of the axial T1 weighted sequence. Brain: Mild generalized cerebral atrophy. Mild T2 FLAIR hyperintense signal abnormality within the periventricular cerebral white matter, nonspecific but compatible with chronic small vessel ischemic disease. Small focus of T2* signal loss within the medial left frontal lobe which may reflect a developmental venous anomaly, focus mineralization or chronic microhemorrhage. There is no acute infarct. Within the limitations of motion degradation, no intracranial mass or extra-axial fluid collection is identified. No midline shift. Vascular: Maintained flow voids within the proximal large arterial vessels. Skull and upper cervical spine: No focal suspicious marrow lesion. Incompletely assessed cervical spondylosis. Sinuses/Orbits: No mass or acute finding within the imaged orbits. Prior bilateral ocular lens replacement. Small fluid level within the right sphenoid sinus. IMPRESSION: Significantly motion degraded examination, as described and limiting evaluation. The diffusion-weighted imaging is of good quality. No evidence of acute infarct. Mild chronic small vessel ischemic changes within the cerebral white matter. Small focus of T2* signal loss within the medial left frontal lobe which may reflect a developmental venous anomaly, chronic microhemorrhage or focus of mineralization. Mild generalized cerebral atrophy. Right sphenoid sinusitis. Electronically Signed   By: Charlsie Quest D.O.   On: 08/23/2021 18:38   DG Pelvis Portable  Result Date: 08/23/2021 CLINICAL DATA:  Right hip pain  EXAM: PORTABLE PELVIS 1-2 VIEWS COMPARISON:  02/20/2021 FINDINGS: No displaced fracture or dislocation is seen. Small bony spurs seen in the right hip. Bony spurs are noted in the pubic symphysis. Degenerative changes are noted in the visualized lower lumbar spine. IMPRESSION: No recent fracture or dislocation is seen. Degenerative changes are noted in the right hip. Electronically Signed   By: 08/25/2021 M.D.   On: 08/23/2021 15:05   CT Head Wo Contrast  Result Date: 08/23/2021 CLINICAL DATA:  Mental status change, unknown cause EXAM: CT HEAD WITHOUT CONTRAST TECHNIQUE: Contiguous axial images were obtained from the base of the skull through the vertex without intravenous contrast. RADIATION DOSE REDUCTION: This exam was performed according to the departmental dose-optimization program which includes automated  exposure control, adjustment of the mA and/or kV according to patient size and/or use of iterative reconstruction technique. COMPARISON:  None Available. FINDINGS: Brain: No evidence of acute infarction, hemorrhage, hydrocephalus, extra-axial collection or mass lesion/mass effect. Patchy white matter hypodensities, nonspecific but compatible with chronic microvascular ischemic disease. Vascular: No hyperdense vessel identified. Skull: No acute fracture. Sinuses/Orbits: Clear sinuses.  No acute orbital findings. Other: No mastoid effusions. IMPRESSION: 1. No evidence of acute intracranial abnormality. 2. Chronic microvascular ischemic disease. Electronically Signed   By: Feliberto Harts M.D.   On: 08/23/2021 13:24   DG Chest Portable 1 View  Result Date: 08/23/2021 CLINICAL DATA:  ams EXAM: PORTABLE CHEST 1 VIEW COMPARISON:  February 27, 2021 FINDINGS: Mild cardiomegaly. Biapical pleural pulmonary scarring. There are some interstitial changes seen. No consolidation, pleural effusion or significant vascular congestion. Thoracic spondylosis. IMPRESSION: No active disease. Electronically Signed    By: Marjo Bicker M.D.   On: 08/23/2021 13:14      Subjective:  No significant events overnight, patient this morning reports she had a good night sleep, patient with some confusion this morning Discharge Exam: Vitals:   08/26/21 0752 08/26/21 0900  BP:  (!) 116/53  Pulse:  81  Resp:  19  Temp:  98.9 F (37.2 C)  SpO2: 96% 95%   Vitals:   08/26/21 0200 08/26/21 0304 08/26/21 0752 08/26/21 0900  BP:  (!) 126/58  (!) 116/53  Pulse:  64  81  Resp:  16  19  Temp:  97.8 F (36.6 C)  98.9 F (37.2 C)  TempSrc:  Oral  Oral  SpO2: 95% 97% 96% 95%  Weight:      Height:        General: Pt is alert, awake, not in acute distress Cardiovascular: RRR, S1/S2 +, no rubs, no gallops Respiratory: CTA bilaterally, no wheezing, no rhonchi Abdominal: Soft, NT, ND, bowel sounds + Extremities: no edema, no cyanosis    The results of significant diagnostics from this hospitalization (including imaging, microbiology, ancillary and laboratory) are listed below for reference.     Microbiology: No results found for this or any previous visit (from the past 240 hour(s)).   Labs: BNP (last 3 results) Recent Labs    02/27/21 1829 08/23/21 1340  BNP 72.7 171.1*   Basic Metabolic Panel: Recent Labs  Lab 08/23/21 1337 08/23/21 1340 08/24/21 0622 08/25/21 0220  NA 134* 138 138 134*  K 4.4 4.3 4.1 4.0  CL  --  93* 92* 93*  CO2  --  36* 35* 34*  GLUCOSE  --  107* 89 114*  BUN  --  13 11 12   CREATININE  --  0.62 0.68 0.75  CALCIUM  --  9.1 8.9 8.7*   Liver Function Tests: Recent Labs  Lab 08/23/21 1340  AST 20  ALT 16  ALKPHOS 60  BILITOT 0.7  PROT 6.7  ALBUMIN 3.4*   No results for input(s): "LIPASE", "AMYLASE" in the last 168 hours. Recent Labs  Lab 08/23/21 1340 08/25/21 0626  AMMONIA <10 23   CBC: Recent Labs  Lab 08/23/21 1337 08/23/21 1340 08/24/21 0622 08/25/21 0220  WBC  --  6.0 5.0 5.3  NEUTROABS  --  4.3  --   --   HGB 11.2* 10.1* 9.3* 9.8*  HCT  33.0* 32.9* 29.1* 30.8*  MCV  --  105.1* 102.8* 101.7*  PLT  --  173 157 165   Cardiac Enzymes: No results for input(s): "CKTOTAL", "CKMB", "CKMBINDEX", "TROPONINI"  in the last 168 hours. BNP: Invalid input(s): "POCBNP" CBG: Recent Labs  Lab 08/24/21 1623  GLUCAP 101*   D-Dimer No results for input(s): "DDIMER" in the last 72 hours. Hgb A1c No results for input(s): "HGBA1C" in the last 72 hours. Lipid Profile No results for input(s): "CHOL", "HDL", "LDLCALC", "TRIG", "CHOLHDL", "LDLDIRECT" in the last 72 hours. Thyroid function studies Recent Labs    08/23/21 1340  TSH 8.159*   Anemia work up Recent Labs    08/25/21 0626  VITAMINB12 295  FOLATE 23.1   Urinalysis    Component Value Date/Time   COLORURINE YELLOW 08/23/2021 [place] 08/23/2021 1611   LABSPEC 1.013 08/23/2021 1611   PHURINE 6.0 08/23/2021 1611   GLUCOSEU NEGATIVE 08/23/2021 1611   HGBUR NEGATIVE 08/23/2021 1611   BILIRUBINUR NEGATIVE 08/23/2021 1611   KETONESUR NEGATIVE 08/23/2021 1611   PROTEINUR NEGATIVE 08/23/2021 1611   NITRITE NEGATIVE 08/23/2021 1611   LEUKOCYTESUR NEGATIVE 08/23/2021 1611   Sepsis Labs Recent Labs  Lab 08/23/21 1340 08/24/21 0622 08/25/21 0220  WBC 6.0 5.0 5.3   Microbiology No results found for this or any previous visit (from the past 240 hour(s)).   Time coordinating discharge: Over 30 minutes  SIGNED:   Phillips Climes, MD  [facility] 08/26/2021, 12:12 PM Pager   If 7PM-7AM, please contact night-coverage [email or web address] Password [email or web address]

## 2021-08-26 NOTE — Plan of Care (Signed)

## 2021-09-10 ENCOUNTER — Other Ambulatory Visit (HOSPITAL_COMMUNITY): Payer: Self-pay | Admitting: Family

## 2021-09-10 ENCOUNTER — Other Ambulatory Visit: Payer: Self-pay | Admitting: Family

## 2021-09-10 DIAGNOSIS — T17920A Food in respiratory tract, part unspecified causing asphyxiation, initial encounter: Secondary | ICD-10-CM

## 2021-09-14 ENCOUNTER — Ambulatory Visit (HOSPITAL_COMMUNITY): Payer: Medicare Other

## 2021-09-14 ENCOUNTER — Other Ambulatory Visit (HOSPITAL_COMMUNITY): Payer: Self-pay

## 2021-09-14 DIAGNOSIS — R1310 Dysphagia, unspecified: Secondary | ICD-10-CM

## 2021-09-14 DIAGNOSIS — R059 Cough, unspecified: Secondary | ICD-10-CM

## 2021-09-18 ENCOUNTER — Ambulatory Visit (HOSPITAL_COMMUNITY)
Admission: RE | Admit: 2021-09-18 | Discharge: 2021-09-18 | Disposition: A | Discharge status: Home / Self Care | Payer: Medicare Other | Source: Ambulatory Visit | Attending: Family | Admitting: Family

## 2021-09-18 DIAGNOSIS — R1310 Dysphagia, unspecified: Secondary | ICD-10-CM | POA: Insufficient documentation

## 2021-09-18 DIAGNOSIS — R059 Cough, unspecified: Secondary | ICD-10-CM

## 2021-09-18 DIAGNOSIS — T17920A Food in respiratory tract, part unspecified causing asphyxiation, initial encounter: Secondary | ICD-10-CM | POA: Insufficient documentation

## 2021-09-18 NOTE — Progress Notes (Signed)
Objective Swallowing Evaluation: Type of Study: MBS-Modified Barium Swallow Study   Patient Details  Name: Valerie Bowman MRN: [id number] Date of Birth: [birth year]  Today's Date: 09/18/2021 Time: SLP Start Time (ACUTE ONLY): 1017 -SLP Stop Time (ACUTE ONLY): 1057  SLP Time Calculation (min) (ACUTE ONLY): 40 min   Past Medical History:  Past Medical History:  Diagnosis Date   Acquired hypothyroidism    Chronic diastolic heart failure (HCC)    COPD (chronic obstructive pulmonary disease) (HCC)    Essential hypertension    Hyperlipidemia    Urinary incontinence    Past Surgical History: No past surgical history on file. HPI: 86 y.o. female referred for OP MBS due to hx of coughing with POs, intermittent globus. Pt experiences unpredictable coughing spells that accompany eating.  Recent admission to [facility] for acute metabolic encephalopathy, acute on chronic resp failure with hypoxia and hypercapnia. She had swallow evaluation by SLP on 6/16.  SLP at [facility] had been recommending swallow study.  PMHx recent fall, COPD with chronic respiratory failure on 2 L of oxygen continuous, chronic diastolic dysfunction CHF, hypothyroidism, hypertension, urinary incontinence. Pt had esophagram immediately prior to MBS - results: ""Moderately degraded/limited exam. No gross esophageal stricture identified. Possible Zenker's diverticulum. Alternatively, this area of retropharyngeal increased density could be related to ligamentous ossification."   Subjective: alert and participatory    Recommendations for follow up therapy are one component of a multi-disciplinary discharge planning process, led by the attending physician.  Recommendations may be updated based on patient status, additional functional criteria and insurance authorization.  Assessment / Plan / Recommendation     09/18/2021    4:00 PM  Clinical Impressions  Clinical Impression Pt presented with overall functional oropharyngeal  swallowing - view was challenged by pt positioning and technique.  There was a possible Zenker's diverticulum on right.  There was intermittent transient penetration of thin liquids but no aspiration. There was no coughing throughout study.  Oral phase was [facility]. Pharyngeal stripping wave was adequate. Brief screen of esophagus revealed stasis of solid food barium.  No obvious etiology for recurring cough associated with PO intake; no aspiration events captured today. Pt may have cough associated with esophageal retention, which is not uncommon. Discussed some basic esophageal recommendations with pt and her daughter, [name].  Continue a regular diet with thin liquids.  SLP Visit Diagnosis Dysphagia, unspecified (R13.10)  Impact on safety and function No limitations         09/18/2021    4:00 PM  Treatment Recommendations  Treatment Recommendations Defer treatment plan to f/u with SLP        08/24/2021    1:00 PM  Prognosis  Prognosis for Safe Diet Advancement Good       09/18/2021    4:00 PM  Diet Recommendations  SLP Diet Recommendations Regular solids;Thin liquid  Liquid Administration via Cup;Straw  Medication Administration Whole meds with liquid  Compensations Follow solids with liquid  Postural Changes Seated upright at 90 degrees         09/18/2021    4:00 PM  Other Recommendations  Oral Care Recommendations Oral care BID  Follow Up Recommendations Home health SLP  Assistance recommended at discharge Intermittent Supervision/Assistance       08/24/2021    1:00 PM  Frequency and Duration   Speech Therapy Frequency (ACUTE ONLY) min 2x/week  Treatment Duration 2 weeks         09/18/2021    4:00 PM  Oral Phase  Oral Phase [facility]       09/18/2021    4:00 PM  Pharyngeal Phase  Pharyngeal Phase Impaired  Pharyngeal- Thin Straw Penetration/Aspiration before swallow  Pharyngeal Material enters airway, remains ABOVE vocal cords then ejected out         No data to  display           Blenda Mounts Laurice 09/18/2021, 6:06 PM   Marchelle Folks L. Samson Frederic, MA CCC/SLP Clinical Specialist - Acute Care SLP Acute Rehabilitation Services Office number [phone]

## 2021-09-19 ENCOUNTER — Encounter: Payer: Self-pay | Admitting: Internal Medicine

## 2021-09-19 ENCOUNTER — Ambulatory Visit (INDEPENDENT_AMBULATORY_CARE_PROVIDER_SITE_OTHER): Payer: Medicare Other | Admitting: Internal Medicine

## 2021-09-19 VITALS — BP 118/62 | HR 70 | Ht 66.0 in | Wt 208.0 lb

## 2021-09-19 DIAGNOSIS — J449 Chronic obstructive pulmonary disease, unspecified: Secondary | ICD-10-CM | POA: Diagnosis not present

## 2021-09-19 DIAGNOSIS — G4733 Obstructive sleep apnea (adult) (pediatric): Secondary | ICD-10-CM | POA: Diagnosis not present

## 2021-09-19 DIAGNOSIS — I5032 Chronic diastolic (congestive) heart failure: Secondary | ICD-10-CM | POA: Diagnosis not present

## 2021-09-19 DIAGNOSIS — J9611 Chronic respiratory failure with hypoxia: Secondary | ICD-10-CM

## 2021-09-19 MED ORDER — ALBUTEROL SULFATE HFA 108 (90 BASE) MCG/ACT IN AERS: 2.0000 | INHALATION_SPRAY | Freq: Four times a day (QID) | RESPIRATORY_TRACT | 6 refills | Status: DC | PRN

## 2021-09-19 NOTE — Patient Instructions (Signed)
Please schedule follow up scheduled with myself in 6 months.  If my schedule is not open yet, we will contact you with a reminder closer to that time. Please call [phone] if you haven't heard from [name] a month before.   I will get some records from pulmonary doctors in [place].   Change albuterol nebulizer treatments to every 6 hours as needed up to 4 times a day.   Continue stiolto as written.  Need oxygen 2LNC at rest and 3LNC with exertion for goal saturations over 88%.

## 2021-09-19 NOTE — Progress Notes (Signed)
Valerie Bowman    [id number]    1935-12-18  Primary Care Physician:Tackett, Aurea Graff, NP  Referring Physician: Herschel Senegal, MD [address] [place],  [place] [address] Reason for Consultation: dyspnea Date of Consultation: 09/19/2021  Chief complaint:   Chief Complaint  Patient presents with   Consult    Self referral for DOE. States her O2 has been dropped while doing physical therapy. Use 2L of O2 during the day and at night.      HPI: Valerie Bowman is a 86 y.o.m woman with history of COPD and HFpEF with Htn who presents for new patient evaluation.   She was hospitalized in June 2023 for altered mental status and was found to be hypercapnic with component of delirium after starting cymbalta.   She lives in a nursing home at [facility].   She no longer ambulates due to radiculopathy and fall risk.   Started on oxygen in 2019 with overnight therapy. Jan 2023 she started [facility] (pulse during the day, continuous at night)  Previously lived in [place] - saw a pulmonologist here. Dr. Annitta Needs in [place]. Did PFTs there.  Takes stiolto for COPD - diagnosed in 2018 - started on spiriva and switched to stiolto.  She also has albuterol nebulizer and hfa before each meal and before she goes to bed. This started in January. She doesn't feel it makes a difference in her breathing and doesn't feel like it helps   Had a sleep study in 2019 and she was told she had sleep apnea.    Social history:  Occupation: worked for [employer] and traveled a lot for work. Born in [place] and then lived in [place]. Moved to [place] to be with daughter who moved here.  Smoking history: 30 pack years  Social History   Occupational History   Not on file  Tobacco Use   Smoking status: Former    Packs/day: 1.00    Years: 30.00    Total pack years: 30.00    Types: Cigarettes    Quit date: 1995    Years since quitting: 28.5   Smokeless tobacco: Never  Substance and Sexual Activity   Alcohol  use: Not Currently   Drug use: Never   Sexual activity: Not on file    Relevant family history:  Family History  Problem Relation Age of Onset   Lung disease Neg Hx     Past Medical History:  Diagnosis Date   Acquired hypothyroidism    Chronic diastolic heart failure (HCC)    COPD (chronic obstructive pulmonary disease) (HCC)    Essential hypertension    Hyperlipidemia    Urinary incontinence     History reviewed. No pertinent surgical history.   Physical Exam: Blood pressure 118/62, pulse 70, height 5\' 6"  (1.676 m), weight 208 lb (94.3 kg), SpO2 96 %. Gen:      No acute distress in wheel chaire ENT:  no nasal polyps, mucus membranes moist on nasal cannula Lungs:    No increased respiratory effort, symmetric chest wall excursion, clear to auscultation bilaterally, no wheezes or crackles CV:         Regular rate and rhythm; no murmurs, rubs, or gallops.  No pedal edema Abd:      + bowel sounds; soft, non-tender; no distension MSK: no acute synovitis of DIP or PIP joints, no mechanics hands.  Skin:      Warm and dry; no rashes Neuro: normal speech, no focal  facial asymmetry Psych: alert and oriented, flat affectr.    Data Reviewed/Medical Decision Making:  Independent interpretation of tests: Imaging: Chest xray June 2023 with chronic interstitial changes and cardiomegaly.   Swallow study without over aspiration.   PFTs:  Labs:  ABG shows chronic hypercapnic and hypoxemia with respiratory compensation from June 17th  Immunization status:   There is no immunization history on file for this patient.   I reviewed prior external note(s) from hospital stay  I reviewed the result(s) of the labs and imaging as noted above.   I have ordered outside records request.    Assessment:  Chronic hypercapnic and hypoxemic respiratory failure likely secondary to: Chronic HFpEF COPD Untreated OSA  Plan/Recommendations:  I will get some records from pulmonary doctors in  [place]. Especially PSG and PFTs  Change albuterol nebulizer treatments to every 6 hours as needed up to 4 times a day.   Continue stiolto as written.  Need oxygen 2LNC at rest and 3LNC with exertion for goal saturations over 88% while working with PT  We discussed disease management and progression at length today with her and her daughter.   I spent 45 minutes in the care of this patient today including pre-charting, chart review, review of results, face-to-face care, coordination of care and communication with consultants etc.).   Return to Care: Return in about 6 months (around 03/22/2022).  Durel Salts, MD Pulmonary and Critical Care Medicine [facility] Office:[phone]  CC: Herschel Senegal, MD

## 2021-09-21 ENCOUNTER — Encounter (HOSPITAL_COMMUNITY): Payer: Medicare Other

## 2021-09-21 ENCOUNTER — Ambulatory Visit (HOSPITAL_COMMUNITY): Payer: Medicare Other

## 2021-09-21 ENCOUNTER — Encounter (HOSPITAL_COMMUNITY): Payer: Self-pay

## 2021-09-21 ENCOUNTER — Encounter: Payer: Self-pay | Admitting: Internal Medicine

## 2021-09-24 NOTE — Progress Notes (Unsigned)
[facility]  PATIENT: Valerie Bowman DOB: 1935-04-17  REFERRING CLINICIAN: Roosvelt Maser, MD HISTORY FROM: self, daughter [name] REASON FOR VISIT: imbalance, neuropathy   HISTORICAL  CHIEF COMPLAINT:  Chief Complaint  Patient presents with   Memory Loss    RM 1 with daughter  [name]  Pt is well, daughter stated they are here for neuropathy not for MCI     HISTORY OF PRESENT ILLNESS:  The patient presents for evaluation of imbalance and neuropathy. She was initially referred for a memory evaluation but states she does not have memory concerns at this time.   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 She previously followed with the [facility] clinic for her imbalance and memory. She was found to have B12 deficiency and prediabetes. EMG 10/06/20 showed a chronic mild length-dependent axonal polyneuropathy. EEG 10/06/20 showed triphasic waves and was suggestive of mild encephalopathy. Neuropsychiatric testing showed minimal problems with visual construction skills and memory retrieval.  Brain MRI 08/23/21 showed mild generalized cerebral atrophy and a small left DVA vs chronic microhemorrhage in the medial left frontal lobe.  In the past year she feels she has lost a lot of strength in her upper and lower extremities. Has progressed from a cane to a walker to a wheelchair. Has been in a wheelchair for the past couple of months. When she is standing she feels like her legs start shaking and then they give out from under her. Has a history of multilevel lumbar spondylosis including severe right foraminal stenosis at L4-5 and mild spinal canal stenosis at L2-3. Previously had severe back pain with this, which has resolved with a steroid shot from orthopedics. Her toes are numb most of the time and she will get pins and needles in her legs. She has a lot of anxiety about falling which can make her  balance worse.  She also reports soreness in her neck without radicular pain. She does drop things with her right hand. Per documentation, MRI of the C-spine showed multilevel cervical spondylosis without significant central stenosis. Report and images are not available for review.  She also struggles with depth perception which impacts her coordination.  She currently goes to PT 5 times per week for balance. Has been going to physical therapy since November 2022.  Has a history of vertigo which has improved. Now only occurs if she stands too quickly. She is able to avoid this by gradually going from sitting to standing.   OTHER MEDICAL CONDITIONS: cervical spondylosis, lumbar spondylosis, peripheral neuropathy, prior alcohol abuse, COPD, OSA, HFpEF, HTN, B12 deficiency, prediabetes   REVIEW OF SYSTEMS: Full 14 system review of systems performed and negative with exception of: imbalance  ALLERGIES: Allergies  Allergen Reactions   Duloxetine Hcl Other (See Comments)   Oxybutynin Other (See Comments)   Penicillins Other (See Comments)    Listed on MAR as allergy Unknown reaction    Sulfa Antibiotics Other (See Comments)    Listed on MAR as allergy Unknown reaction    HOME MEDICATIONS: Outpatient Medications Prior to Visit  Medication Sig Dispense Refill   acetaminophen (TYLENOL) 500 MG tablet Take 1,000 mg by mouth 3 (three) times daily.     albuterol (PROVENTIL) (2.5 MG/3ML) 0.083% nebulizer solution Take 2.5 mg by nebulization as needed.     albuterol (VENTOLIN HFA) 108 (90 Base) MCG/ACT inhaler Inhale 2 puffs into the lungs every 6 (  six) hours as needed for wheezing or shortness of breath. 8 g 6   amLODipine (NORVASC) 5 MG tablet Take 5 mg by mouth daily.     aspirin EC 81 MG tablet Take 81 mg by mouth daily.     cetirizine (ZYRTEC) 10 MG tablet Take 10 mg by mouth daily.     clobetasol (TEMOVATE) 0.05 % external solution Apply 1 Application topically 2 (two) times daily as  needed (itching).     diclofenac Sodium (VOLTAREN) 1 % GEL Apply 2 g topically every 6 (six) hours as needed (pain).     enalapril (VASOTEC) 20 MG tablet Take 20 mg by mouth 2 (two) times daily. Hold for SBP < 110     famotidine (PEPCID) 20 MG tablet Take 20 mg by mouth at bedtime.     Glycerin-Hypromellose-PEG 400 (ARTIFICIAL TEARS) 0.2-0.2-1 % SOLN Place 1 drop into both eyes 2 (two) times daily.     ibuprofen (ADVIL) 200 MG tablet Take 400 mg by mouth every 8 (eight) hours as needed (pain).     levothyroxine (SYNTHROID) 150 MCG tablet Take 1 tablet (150 mcg total) by mouth daily at 6 (six) AM. 30 tablet 1   magnesium hydroxide (MILK OF MAGNESIA) 400 MG/5ML suspension Take 30 mLs by mouth 2 (two) times daily as needed (constipation).     Multiple Vitamins-Minerals (ALIVE WOMENS 50+ GUMMY PO) Take 1 Dose by mouth in the morning.     Phenazopyridine HCl (AZO URINARY PAIN RELIEF) 99.5 MG TABS Take 199 mg by mouth 3 (three) times daily as needed (urinary symptoms).     senna (SENOKOT) 8.6 MG TABS tablet Take 1 tablet by mouth 2 (two) times daily.     Tiotropium Bromide-Olodaterol 2.5-2.5 MCG/ACT AERS Inhale 2 puffs into the lungs in the morning.     vitamin B-12 (CYANOCOBALAMIN) 500 MCG tablet Take 1 tablet (500 mcg total) by mouth daily.     albuterol (VENTOLIN HFA) 108 (90 Base) MCG/ACT inhaler Inhale 1 puff into the lungs every 6 (six) hours as needed for wheezing or shortness of breath. 1 each 2   No facility-administered medications prior to visit.    PAST MEDICAL HISTORY: Past Medical History:  Diagnosis Date   Acquired hypothyroidism    Chronic diastolic heart failure (HCC)    COPD (chronic obstructive pulmonary disease) (HCC)    Essential hypertension    Hyperlipidemia    Urinary incontinence     PAST SURGICAL HISTORY: History reviewed. No pertinent surgical history.  FAMILY HISTORY: Family History  Problem Relation Age of Onset   Lung disease Neg Hx     SOCIAL  HISTORY: Social History   Socioeconomic History   Marital status: Widowed    Spouse name: Not on file   Number of children: Not on file   Years of education: Not on file   Highest education level: Not on file  Occupational History   Not on file  Tobacco Use   Smoking status: Former    Packs/day: 1.00    Years: 30.00    Total pack years: 30.00    Types: Cigarettes    Quit date: 32    Years since quitting: 28.5   Smokeless tobacco: Never  Substance and Sexual Activity   Alcohol use: Not Currently   Drug use: Never   Sexual activity: Not on file  Other Topics Concern   Not on file  Social History Narrative   Not on file   Social Determinants of Health  Financial Resource Strain: Not on file  Food Insecurity: Not on file  Transportation Needs: Not on file  Physical Activity: Not on file  Stress: Not on file  Social Connections: Not on file  Intimate Partner Violence: Not on file     PHYSICAL EXAM  GENERAL EXAM/CONSTITUTIONAL: Vitals:  Vitals:   09/25/21 1251  BP: 125/60  Pulse: 67   There is no height or weight on file to calculate BMI. Wt Readings from Last 3 Encounters:  09/19/21 208 lb (94.3 kg)  08/23/21 210 lb 1.6 oz (95.3 kg)  03/01/21 190 lb 11.2 oz (86.5 kg)    NEUROLOGIC: MENTAL STATUS:   awake, alert, oriented to person, place and time recent and remote memory intact normal attention and concentration   CRANIAL NERVE:  2nd, 3rd, 4th, 6th - pupils equal and reactive to light, visual fields full to confrontation, extraocular muscles intact, no nystagmus 5th - facial sensation symmetric 7th - facial strength symmetric 8th - hearing intact 9th - palate elevates symmetrically, uvula midline 11th - shoulder shrug symmetric 12th - tongue protrusion midline  MOTOR:  normal bulk and tone, full strength in the BUE, BLE  SENSORY:  Decreased sensation to vibration in bilateral lower extremities.  Hyperalgesia to pinprick bilateral lower  extremities. Light touch and proprioception intact all 4 extermities  COORDINATION:  finger-nose-finger, fine finger movements normal  REFLEXES:  deep tendon reflexes present and symmetric  GAIT/STATION:  Deferred (patient in wheelchair without walker or cane present)    DIAGNOSTIC DATA (LABS, IMAGING, TESTING) - I reviewed patient records, labs, notes, testing and imaging myself where available.  Lab Results  Component Value Date   WBC 5.3 08/25/2021   HGB 9.8 (L) 08/25/2021   HCT 30.8 (L) 08/25/2021   MCV 101.7 (H) 08/25/2021   PLT 165 08/25/2021      Component Value Date/Time   NA 134 (L) 08/25/2021 0220   K 4.0 08/25/2021 0220   CL 93 (L) 08/25/2021 0220   CO2 34 (H) 08/25/2021 0220   GLUCOSE 114 (H) 08/25/2021 0220   BUN 12 08/25/2021 0220   CREATININE 0.75 08/25/2021 0220   CALCIUM 8.7 (L) 08/25/2021 0220   PROT 6.7 08/23/2021 1340   ALBUMIN 3.4 (L) 08/23/2021 1340   AST 20 08/23/2021 1340   ALT 16 08/23/2021 1340   ALKPHOS 60 08/23/2021 1340   BILITOT 0.7 08/23/2021 1340   GFRNONAA >60 08/25/2021 0220    Lab Results  Component Value Date   TSH 8.159 (H) 08/23/2021   Paraneoplastic and autimmune testing negative  ASSESSMENT AND PLAN  86 y.o. year old female with a history of cervical spondylosis, lumbar spondylosis, peripheral neuropathy, prior alcohol abuse, COPD, OSA, HFpEF, HTN, B12 deficiency, prediabetes who presents for evaluation of gait imbalance and neuropathy. She does have confirmed axonal neuropathy seen on EMG last year. Has multiple possible causes for neuropathy including B12 deficiency, prediabetes, and prior alcohol abuse. Discussed that damage to nerves from neuropathy is often not reversible. Agree with prior assessments that her gait disorder is multifactorial, likely due to her neuropathy, lumbar spondylosis, decreased vision, and fatigue from medical comorbidities. Agree with continuing PT for balance and gait. Will check TSH and B12  levels today as these can contribute to neuropathy and imbalance.  1. Neuropathy   2. Acquired hypothyroidism     PLAN: -Blood work: B12, TSH -continue PT for gait and imbalance  Orders Placed This Encounter  Procedures   Vitamin B12   TSH  No orders of the defined types were placed in this encounter.   Return in about 6 months (around 03/28/2022).    Ocie Doyne, MD 09/25/21 4:09 PM  I spent an average of 60 minutes chart reviewing and counseling the patient, with at least 50% of the time face to face with the patient.  72 pages of outside medical records were reviewed.  [facility] [address] [place], [place] [address] [phone]

## 2021-09-25 ENCOUNTER — Ambulatory Visit (INDEPENDENT_AMBULATORY_CARE_PROVIDER_SITE_OTHER): Payer: Medicare Other | Admitting: Psychiatry

## 2021-09-25 ENCOUNTER — Encounter: Payer: Self-pay | Admitting: Psychiatry

## 2021-09-25 ENCOUNTER — Ambulatory Visit: Payer: Medicare Other | Admitting: Psychiatry

## 2021-09-25 VITALS — BP 125/60 | HR 67

## 2021-09-25 DIAGNOSIS — G629 Polyneuropathy, unspecified: Secondary | ICD-10-CM

## 2021-09-25 DIAGNOSIS — R2689 Other abnormalities of gait and mobility: Secondary | ICD-10-CM | POA: Diagnosis not present

## 2021-09-25 DIAGNOSIS — E039 Hypothyroidism, unspecified: Secondary | ICD-10-CM | POA: Diagnosis not present

## 2021-09-26 LAB — TSH: TSH: 1.89 u[IU]/mL (ref 0.450–4.500)

## 2021-09-26 LAB — VITAMIN B12: Vitamin B-12: 807 pg/mL (ref 232–1245)

## 2021-11-30 ENCOUNTER — Other Ambulatory Visit: Payer: Self-pay

## 2021-11-30 DIAGNOSIS — M5416 Radiculopathy, lumbar region: Secondary | ICD-10-CM

## 2021-12-08 ENCOUNTER — Encounter: Payer: Self-pay | Admitting: Internal Medicine

## 2021-12-10 NOTE — Telephone Encounter (Signed)
Mychart message sent by pt's daughter: Valerie Bowman "Valerie Bowman"  P [facility] Pool (supporting Spero Geralds, MD) 2 days ago   [facility] Following up to see if you received Mom's records from Dr. Erasmo Score, her previous pulmonologist in [place], [place]. Regardless, do I need to schedule her for breathing tests? She complains frequently of fatigue; every few weeks has slept through an entire day. Unsure of next steps to help her.    Valerie Bowman (Diamantina's daughter) [phone]   Dr. Shearon Stalls, please advise on this. Please advise if you ever received records from previous pulmonologist. Release of records was faxed 09/19/21. If you have not received records, we can refax this as we still have the prior one in file cabinet on [address].

## 2021-12-14 ENCOUNTER — Telehealth: Payer: Self-pay | Admitting: Physical Medicine and Rehabilitation

## 2021-12-14 NOTE — Telephone Encounter (Signed)
Yes I did receive receive records from her previous pulmonologist and specifically her PFTs from 2021 are scanned into the record. Agree that she needs a follow up appt. No need for repeat testing until we meet again

## 2021-12-14 NOTE — Telephone Encounter (Signed)
Patient's daughter called needing to CX patient's appointment. Will call back to R/S when patient is feeling better

## 2021-12-18 ENCOUNTER — Encounter: Payer: Medicare Other | Admitting: Physical Medicine and Rehabilitation

## 2021-12-19 ENCOUNTER — Encounter: Payer: Self-pay | Admitting: Nurse Practitioner

## 2021-12-19 ENCOUNTER — Ambulatory Visit (INDEPENDENT_AMBULATORY_CARE_PROVIDER_SITE_OTHER): Payer: Medicare Other | Admitting: Nurse Practitioner

## 2021-12-19 VITALS — BP 112/62 | HR 64 | Temp 98.2°F | Ht 65.0 in | Wt 217.2 lb

## 2021-12-19 DIAGNOSIS — J9611 Chronic respiratory failure with hypoxia: Secondary | ICD-10-CM | POA: Diagnosis not present

## 2021-12-19 DIAGNOSIS — I5032 Chronic diastolic (congestive) heart failure: Secondary | ICD-10-CM

## 2021-12-19 DIAGNOSIS — J9612 Chronic respiratory failure with hypercapnia: Secondary | ICD-10-CM

## 2021-12-19 DIAGNOSIS — J449 Chronic obstructive pulmonary disease, unspecified: Secondary | ICD-10-CM

## 2021-12-19 DIAGNOSIS — G4733 Obstructive sleep apnea (adult) (pediatric): Secondary | ICD-10-CM

## 2021-12-19 MED ORDER — TRELEGY ELLIPTA 100-62.5-25 MCG/ACT IN AEPB: 1.0000 | INHALATION_SPRAY | Freq: Every day | RESPIRATORY_TRACT | 0 refills | Status: DC

## 2021-12-19 NOTE — Assessment & Plan Note (Signed)
Appears compensated on exam today. She was recently start on hctz by her PCP.

## 2021-12-19 NOTE — Assessment & Plan Note (Signed)
She has hypercapnia with CO2 61 on ABG secondary to severe COPD and CHF. She will need to be started on BiPAP given this and her history of OSA. Order sent to DME for urgent new start auto BiPAP IPAP max 12, EPAP min 6, PS 4. Will need ONO on BiPAP to determine O2 therapy. She will continue on current oxygen settings during the day of 2 lpm for goal >88-90%

## 2021-12-19 NOTE — Patient Instructions (Addendum)
Continue Albuterol inhaler 2 puffs or 3 mL neb every 6 hours as needed for shortness of breath or wheezing. Notify if symptoms persist despite rescue inhaler/neb use.  Continue zyrtec 1 tab daily for allergies  Continue supplemental oxygen 2 lpm with rest and 3 lpm with activity for goal >88-90%  Stop Stiolto. Start Trelegy 1 puff daily. Brush tongue and rinse mouth afterwards.   We will try to get you set up on BiPAP therapy at night due to history of obstructive sleep apnea and severe COPD - orders sent today.   Follow up in 4 weeks with Dr. Shearon Stalls to see how new inhaler is going. If symptoms do not improve or worsen, please contact office for sooner follow up or seek emergency care.

## 2021-12-19 NOTE — Progress Notes (Signed)
@Patient  ID: Valerie Bowman, female    DOB: 27-Jan-1936, 86 y.o.   MRN: [id number]  Chief Complaint  Patient presents with   Follow-up    C/o confusion, wheezing, oxygen decreased at night, sat in am on 2L 85%, sob same    Referring provider: Belva Agee, NP  HPI: 86 year old female, former smoker followed for severe COPD, chronic respiratory failure with hypoxia and hypercapnia on supplemental O2. She is a patient of Dr. Mauricio Po and last seen in office on 09/19/2021 for initial pulmonary consult. Previously followed at [facility] before relocating to [facility]. Past medical history significant for HFpEF, HTN, hypothyroidism.  TEST/EVENTS:  01/04/2020 PFT: FVC 59, FEV1 46, ratio 64. +BD 08/25/2021 ABG: PCO2 61  09/19/2021: OV with Dr. Shearon Stalls for pulmonary consult.  Hospitalized in June 2023 for altered mental status and was found to be hypercapnic with component of delirium after starting Cymbalta.  She was originally started on oxygen at night in 2019 and pulsed O2 during the day in January 2023.  Previously lived in [place] and saw pulmonary there.  Currently on Stiolto for COPD which was diagnosed in 2018.  She also has albuterol nebulizer and HFA before each meal and before she goes to bed.  Does not feel like it makes a huge difference in her breathing.  She had a sleep study in 2019 and was told she had sleep apnea.  Swallow study without overt aspiration.  ABG shows chronic hypercapnia and hypoxemia from August 25, 2021.  Records requested.  Continued on Stiolto.  Disease management and progression discussed at length.  Follow-up 6 months.  12/19/2021: Today-acute Patient presents today for acute visit with her daughter.  Her daughter tells me that she has been more sleepy throughout the day over the past month or so.  She also seems a little more confused.  She has had waxing and waning episodes of this over the past year.  She is also noticed that her oxygen levels are little bit lower in the  morning.  She is usually around 85%.  They deny any drops during the day on her supplemental O2 of 2 L/min.  She does occasionally increase to 3 L with activity when working with PT.  She feels like her breathing has declined over the last few years.  She denies any significant increase in cough or chest congestion.  She does notice wheezing that is worse in the mornings.  No increased O2 requirement.  Lives a relatively sedentary lifestyle. She was told in the past that she had obstructive sleep apnea but she did not want to start on PAP therapy.  Allergies  Allergen Reactions   Duloxetine Hcl Other (See Comments)   Oxybutynin Other (See Comments)   Penicillins Other (See Comments)    Listed on MAR as allergy Unknown reaction    Sulfa Antibiotics Other (See Comments)    Listed on MAR as allergy Unknown reaction    Immunization History  Administered Date(s) Administered   Fluad Quad(high Dose 65+) 12/12/2021    Past Medical History:  Diagnosis Date   Acquired hypothyroidism    Chronic diastolic heart failure (HCC)    COPD (chronic obstructive pulmonary disease) ([place])    Essential hypertension    Hyperlipidemia    Urinary incontinence     Tobacco History: Social History   Tobacco Use  Smoking Status Former   Packs/day: 1.00   Years: 30.00   Total pack years: 30.00   Types: Cigarettes  Quit date: 71   Years since quitting: 28.7  Smokeless Tobacco Never   Counseling given: Not Answered   Outpatient Medications Prior to Visit  Medication Sig Dispense Refill   acetaminophen (TYLENOL) 500 MG tablet Take 1,000 mg by mouth 3 (three) times daily.     albuterol (PROVENTIL) (2.5 MG/3ML) 0.083% nebulizer solution Take 2.5 mg by nebulization as needed.     albuterol (VENTOLIN HFA) 108 (90 Base) MCG/ACT inhaler Inhale 2 puffs into the lungs every 6 (six) hours as needed for wheezing or shortness of breath. 8 g 6   amLODipine (NORVASC) 5 MG tablet Take 5 mg by mouth daily.      aspirin EC 81 MG tablet Take 81 mg by mouth daily.     cetirizine (ZYRTEC) 10 MG tablet Take 10 mg by mouth daily.     clobetasol (TEMOVATE) 0.05 % external solution Apply 1 Application topically 2 (two) times daily as needed (itching).     diclofenac Sodium (VOLTAREN) 1 % GEL Apply 2 g topically every 6 (six) hours as needed (pain).     enalapril (VASOTEC) 20 MG tablet Take 20 mg by mouth 2 (two) times daily. Hold for SBP < 110     famotidine (PEPCID) 20 MG tablet Take 20 mg by mouth at bedtime.     Glycerin-Hypromellose-PEG 400 (ARTIFICIAL TEARS) 0.2-0.2-1 % SOLN Place 1 drop into both eyes 2 (two) times daily.     hydrochlorothiazide (HYDRODIURIL) 12.5 MG tablet Take 12.5 mg by mouth daily.     ibuprofen (ADVIL) 200 MG tablet Take 400 mg by mouth every 8 (eight) hours as needed (pain).     levothyroxine (SYNTHROID) 150 MCG tablet Take 1 tablet (150 mcg total) by mouth daily at 6 (six) AM. 30 tablet 1   magnesium hydroxide (MILK OF MAGNESIA) 400 MG/5ML suspension Take 30 mLs by mouth 2 (two) times daily as needed (constipation).     Multiple Vitamins-Minerals (ALIVE WOMENS 50+ GUMMY PO) Take 1 Dose by mouth in the morning.     Phenazopyridine HCl (AZO URINARY PAIN RELIEF) 99.5 MG TABS Take 199 mg by mouth 3 (three) times daily as needed (urinary symptoms).     senna (SENOKOT) 8.6 MG TABS tablet Take 1 tablet by mouth 2 (two) times daily.     Tiotropium Bromide-Olodaterol 2.5-2.5 MCG/ACT AERS Inhale 2 puffs into the lungs in the morning.     vitamin B-12 (CYANOCOBALAMIN) 500 MCG tablet Take 1 tablet (500 mcg total) by mouth daily.     No facility-administered medications prior to visit.     Review of Systems:   Constitutional: No weight loss or gain, night sweats, fevers, chills, or lassitude. +fatigue, lethargy  HEENT: No headaches, difficulty swallowing, tooth/dental problems, or sore throat. No sneezing, itching, ear ache, nasal congestion, or post nasal drip CV:  + swelling in lower  extremities (stable). No chest pain, orthopnea, PND, anasarca, dizziness, palpitations, syncope Resp: +shortness of breath with exertion; chronic cough; wheezing. No excess mucus or change in color of mucus. No hemoptysis.  No chest wall deformity GI:  No heartburn, indigestion, abdominal pain, nausea, vomiting, diarrhea, change in bowel habits, loss of appetite, bloody stools.  Neuro: No dizziness or lightheadedness.  Psych: No depression or anxiety. Mood stable.     Physical Exam:  BP 112/62 (BP Location: Left Arm, Cuff Size: Large)   Pulse 64   Temp 98.2 F (36.8 C) (Temporal)   Ht 5\' 5"  (1.651 m)   Wt 217 lb 3.2  oz (98.5 kg)   SpO2 98%   BMI 36.14 kg/m   GEN: Pleasant, interactive, chronically-ill appearing; obese; in no acute distress. HEENT:  Normocephalic and atraumatic. PERRLA. Sclera white. Nasal turbinates pink, moist and patent bilaterally. No rhinorrhea present. Oropharynx pink and moist, without exudate or edema. No lesions, ulcerations, or postnasal drip.  NECK:  Supple w/ fair ROM. No JVD present. No lymphadenopathy.   CV: RRR, no m/r/g, no peripheral edema. Pulses intact, +2 bilaterally. No cyanosis, pallor or clubbing. PULMONARY:  Unlabored, regular breathing. Diminished bilaterally A&P w/o wheezes/rales/rhonchi. No accessory muscle use.  GI: BS present and normoactive. Soft, non-tender to palpation. No organomegaly or masses detected. MSK: No erythema, warmth or tenderness. No deformities or joint swelling noted.  Neuro: A/Ox3. No focal deficits noted.   Skin: Warm, no lesions or rashe Psych: Normal affect and behavior. Judgement and thought content appropriate.     Lab Results:  CBC    Component Value Date/Time   WBC 5.3 08/25/2021 0220   RBC 3.03 (L) 08/25/2021 0220   HGB 9.8 (L) 08/25/2021 0220   HCT 30.8 (L) 08/25/2021 0220   PLT 165 08/25/2021 0220   MCV 101.7 (H) 08/25/2021 0220   MCH 32.3 08/25/2021 0220   MCHC 31.8 08/25/2021 0220   RDW 13.1  08/25/2021 0220   LYMPHSABS 0.8 08/23/2021 1340   MONOABS 0.6 08/23/2021 1340   EOSABS 0.1 08/23/2021 1340   BASOSABS 0.0 08/23/2021 1340    BMET    Component Value Date/Time   NA 134 (L) 08/25/2021 0220   K 4.0 08/25/2021 0220   CL 93 (L) 08/25/2021 0220   CO2 34 (H) 08/25/2021 0220   GLUCOSE 114 (H) 08/25/2021 0220   BUN 12 08/25/2021 0220   CREATININE 0.75 08/25/2021 0220   CALCIUM 8.7 (L) 08/25/2021 0220   GFRNONAA >60 08/25/2021 0220    BNP    Component Value Date/Time   BNP 171.1 (H) 08/23/2021 1340     Imaging:  No results found.        No data to display          No results found for: "NITRICOXIDE"      Assessment & Plan:   COPD, severe (HCC) Severe COPD with reversibility on previous PFTs from 2021. We will trial addition of ICS and step up to triple therapy. Provided with samples of Trelegy today; teach back performed. Evaluate if she receives benefit from use at follow up. We discussed the severity of her lung function and the correlation between this and CO2 retention, which is likely the cause of her lethargy/fatigue during the day and confusion.   Patient Instructions  Continue Albuterol inhaler 2 puffs or 3 mL neb every 6 hours as needed for shortness of breath or wheezing. Notify if symptoms persist despite rescue inhaler/neb use.  Continue zyrtec 1 tab daily for allergies  Continue supplemental oxygen 2 lpm with rest and 3 lpm with activity for goal >88-90%  Stop Stiolto. Start Trelegy 1 puff daily. Brush tongue and rinse mouth afterwards.   We will try to get you set up on BiPAP therapy at night due to history of obstructive sleep apnea and severe COPD - orders sent today.   Follow up in 4 weeks with Dr. Celine Mans to see how new inhaler is going. If symptoms do not improve or worsen, please contact office for sooner follow up or seek emergency care.     Chronic respiratory failure with hypoxia and hypercapnia (HCC) She has  hypercapnia  with CO2 61 on ABG secondary to severe COPD and CHF. She will need to be started on BiPAP given this and her history of OSA. Order sent to DME for urgent new start auto BiPAP IPAP max 12, EPAP min 6, PS 4. Will need ONO on BiPAP to determine O2 therapy. She will continue on current oxygen settings during the day of 2 lpm for goal >88-90%  OSA (obstructive sleep apnea) History of OSA, diagnosed 2019. Never started on PAP therapy. Declined repeat in lab study. Records requested today. See above plan.   Chronic diastolic heart failure (HCC) Appears compensated on exam today. She was recently start on hctz by her PCP.    I spent 38 minutes of dedicated to the care of this patient on the date of this encounter to include pre-visit review of records, face-to-face time with the patient discussing conditions above, post visit ordering of testing, clinical documentation with the electronic health record, making appropriate referrals as documented, and communicating necessary findings to members of the patients care team.  Noemi Chapel, NP 12/19/2021  Pt aware and understands NP's role.

## 2021-12-19 NOTE — Assessment & Plan Note (Signed)
History of OSA, diagnosed 2019. Never started on PAP therapy. Declined repeat in lab study. Records requested today. See above plan.

## 2021-12-19 NOTE — Assessment & Plan Note (Addendum)
Severe COPD with reversibility on previous PFTs from 2021. We will trial addition of ICS and step up to triple therapy. Provided with samples of Trelegy today; teach back performed. Evaluate if she receives benefit from use at follow up. We discussed the severity of her lung function and the correlation between this and CO2 retention, which is likely the cause of her lethargy/fatigue during the day and confusion.   Patient Instructions  Continue Albuterol inhaler 2 puffs or 3 mL neb every 6 hours as needed for shortness of breath or wheezing. Notify if symptoms persist despite rescue inhaler/neb use.  Continue zyrtec 1 tab daily for allergies  Continue supplemental oxygen 2 lpm with rest and 3 lpm with activity for goal >88-90%  Stop Stiolto. Start Trelegy 1 puff daily. Brush tongue and rinse mouth afterwards.   We will try to get you set up on BiPAP therapy at night due to history of obstructive sleep apnea and severe COPD - orders sent today.   Follow up in 4 weeks with Dr. Shearon Stalls to see how new inhaler is going. If symptoms do not improve or worsen, please contact office for sooner follow up or seek emergency care.

## 2021-12-20 ENCOUNTER — Telehealth: Payer: Self-pay | Admitting: Nurse Practitioner

## 2021-12-20 DIAGNOSIS — J9611 Chronic respiratory failure with hypoxia: Secondary | ICD-10-CM

## 2021-12-20 NOTE — Telephone Encounter (Signed)
Valerie Bowman, would you like for [name] to use the diagnosis of chronic respiratory failure with hypoxia and hypercapnia instead of OSA for her bipap machine?

## 2021-12-20 NOTE — Telephone Encounter (Signed)
Yes, thank you.

## 2021-12-20 NOTE — Telephone Encounter (Signed)
New order has been placed. 

## 2021-12-24 ENCOUNTER — Other Ambulatory Visit (HOSPITAL_COMMUNITY): Payer: Self-pay | Admitting: *Deleted

## 2021-12-24 DIAGNOSIS — R1310 Dysphagia, unspecified: Secondary | ICD-10-CM

## 2021-12-24 DIAGNOSIS — R059 Cough, unspecified: Secondary | ICD-10-CM

## 2021-12-25 NOTE — Telephone Encounter (Signed)
Caryl Pina called to give more information for patient's insurance for the cpap machine.  Please call to discuss at [phone]

## 2021-12-26 ENCOUNTER — Telehealth: Payer: Self-pay | Admitting: Physical Medicine and Rehabilitation

## 2021-12-26 NOTE — Telephone Encounter (Signed)
Pt's daughter [name] called to set an appt for pt with DR Ernestina Patches for back injection. Please call pt's daughter at [phone].

## 2021-12-27 NOTE — Telephone Encounter (Signed)
IC rescheduled ESI

## 2022-01-01 ENCOUNTER — Ambulatory Visit (HOSPITAL_COMMUNITY)
Admission: RE | Admit: 2022-01-01 | Discharge: 2022-01-01 | Disposition: A | Discharge status: Home / Self Care | Payer: Medicare Other | Source: Ambulatory Visit | Attending: Family | Admitting: Family

## 2022-01-01 DIAGNOSIS — R1310 Dysphagia, unspecified: Secondary | ICD-10-CM | POA: Insufficient documentation

## 2022-01-01 DIAGNOSIS — Z9981 Dependence on supplemental oxygen: Secondary | ICD-10-CM | POA: Insufficient documentation

## 2022-01-01 DIAGNOSIS — J449 Chronic obstructive pulmonary disease, unspecified: Secondary | ICD-10-CM | POA: Diagnosis not present

## 2022-01-01 DIAGNOSIS — I110 Hypertensive heart disease with heart failure: Secondary | ICD-10-CM | POA: Insufficient documentation

## 2022-01-01 DIAGNOSIS — I5032 Chronic diastolic (congestive) heart failure: Secondary | ICD-10-CM | POA: Diagnosis not present

## 2022-01-01 DIAGNOSIS — R12 Heartburn: Secondary | ICD-10-CM | POA: Diagnosis not present

## 2022-01-01 DIAGNOSIS — R32 Unspecified urinary incontinence: Secondary | ICD-10-CM | POA: Insufficient documentation

## 2022-01-01 DIAGNOSIS — E785 Hyperlipidemia, unspecified: Secondary | ICD-10-CM | POA: Diagnosis not present

## 2022-01-01 DIAGNOSIS — R059 Cough, unspecified: Secondary | ICD-10-CM | POA: Insufficient documentation

## 2022-01-01 DIAGNOSIS — E039 Hypothyroidism, unspecified: Secondary | ICD-10-CM | POA: Diagnosis not present

## 2022-01-04 ENCOUNTER — Telehealth: Payer: Self-pay | Admitting: Nurse Practitioner

## 2022-01-04 NOTE — Telephone Encounter (Signed)
Spoke with daughter and she stated the patient is feeling uncomfortable because she is having issues with her leg and she is concerned he will not be able to get on the table. Informed her to give [name] a call when she is ready to schedule

## 2022-01-04 NOTE — Telephone Encounter (Signed)
Routing to [place] for f/u  

## 2022-01-04 NOTE — Telephone Encounter (Signed)
Pt daughter [name] called in stating that pt would like to cancel 01/08/2022 appt because pt is feeling uncomfortable about have the procedure.... Pt daughter stated that pt have a lot going on at this time.Marland KitchenMarland Kitchen

## 2022-01-08 ENCOUNTER — Encounter: Payer: Medicare Other | Admitting: Physical Medicine and Rehabilitation

## 2022-01-15 ENCOUNTER — Ambulatory Visit (INDEPENDENT_AMBULATORY_CARE_PROVIDER_SITE_OTHER): Payer: Medicare Other | Admitting: Nurse Practitioner

## 2022-01-15 ENCOUNTER — Encounter: Payer: Self-pay | Admitting: Nurse Practitioner

## 2022-01-15 VITALS — BP 122/70 | HR 64 | Ht 66.0 in | Wt 217.0 lb

## 2022-01-15 DIAGNOSIS — J9611 Chronic respiratory failure with hypoxia: Secondary | ICD-10-CM

## 2022-01-15 DIAGNOSIS — R053 Chronic cough: Secondary | ICD-10-CM

## 2022-01-15 DIAGNOSIS — J4489 Other specified chronic obstructive pulmonary disease: Secondary | ICD-10-CM

## 2022-01-15 DIAGNOSIS — J449 Chronic obstructive pulmonary disease, unspecified: Secondary | ICD-10-CM | POA: Diagnosis not present

## 2022-01-15 DIAGNOSIS — J439 Emphysema, unspecified: Secondary | ICD-10-CM

## 2022-01-15 DIAGNOSIS — J9612 Chronic respiratory failure with hypercapnia: Secondary | ICD-10-CM

## 2022-01-15 MED ORDER — BENZONATATE 200 MG PO CAPS: 200.0000 mg | ORAL_CAPSULE | Freq: Three times a day (TID) | ORAL | 1 refills | Status: DC | PRN

## 2022-01-15 MED ORDER — GUAIFENESIN ER 600 MG PO TB12: 600.0000 mg | ORAL_TABLET | Freq: Two times a day (BID) | ORAL | 1 refills | Status: DC

## 2022-01-15 NOTE — Assessment & Plan Note (Signed)
She has hypercapnia with CO2 61 on ABG secondary to severe COPD and CHF.   The patient continues to exhibit signs of hypercapnea associated with chronic respiratory failure secondary to severe COPD and hemidiaphragm paralysis.  Interruption or failure to provide NIV would quickly lead to exacerbation of the patient's condition, hospital readmission, and likely harm the patient.  Continued use is preferred.  The use of the NIV will treat the patient's PCO2 levels and can reduce the risk of exacerbations and future hospitalizations when used at night and during the day.  Bilevel/RAD therapy with and without a rate would be ineffective as the patient requires a volume targeted mode.  Ventilation is required to decrease the work of breathing and improve pulmonary status.  Interruption of ventilator support would lead to a decline of health status.  Patient is able to protect their airways and clear secretions on their own. Orders placed for NIV/Trilogy; will follow up with [employer].

## 2022-01-15 NOTE — Progress Notes (Signed)
  ID: Elpidio Eric, female    DOB: 1936/03/05, 86 y.o.   MRN: [id number]  Chief Complaint  Patient presents with   Follow-up    Pt f/u on OSA to discuss therapy, pt currently uses 2L/m of O2. Pt reports breathing has been fine    Referring provider: Smiley Houseman, NP  HPI: 86 year old female, former smoker followed for severe COPD, chronic respiratory failure with hypoxia and hypercapnia on supplemental O2. She is a patient of Dr. Humphrey Rolls and last seen in office on 09/19/2021 for initial pulmonary consult. Previously followed at [facility] before relocating to [facility]. Past medical history significant for HFpEF, HTN, hypothyroidism.  TEST/EVENTS:  01/04/2020 PFT: FVC 59, FEV1 46, ratio 64. +BD 08/25/2021 ABG: PCO2 61  09/19/2021: OV with Dr. Celine Mans for pulmonary consult.  Hospitalized in June 2023 for altered mental status and was found to be hypercapnic with component of delirium after starting Cymbalta.  She was originally started on oxygen at night in 2019 and pulsed O2 during the day in January 2023.  Previously lived in [place] and saw pulmonary there.  Currently on Stiolto for COPD which was diagnosed in 2018.  She also has albuterol nebulizer and HFA before each meal and before she goes to bed.  Does not feel like it makes a huge difference in her breathing.  She had a sleep study in 2019 and was told she had sleep apnea.  Swallow study without overt aspiration.  ABG shows chronic hypercapnia and hypoxemia from August 25, 2021.  Records requested.  Continued on Stiolto.  Disease management and progression discussed at length.  Follow-up 6 months.  12/19/2021: OV with Loreli Debruler NP for acute visit with her daughter.  Her daughter tells me that she has been more sleepy throughout the day over the past month or so.  She also seems a little more confused.  She has had waxing and waning episodes of this over the past year.  She is also noticed that her oxygen levels are little bit lower in the morning.   She is usually around 85%.  They deny any drops during the day on her supplemental O2 of 2 L/min.  She does occasionally increase to 3 L with activity when working with PT.  She feels like her breathing has declined over the last few years.  She denies any significant increase in cough or chest congestion.  Severe COPD with reversibility on PFTs. Does not seem to be in acute exacerbation; more in line with progression of disease. Recommended step up to triple therapy with Trelegy. Discussed correlation between severe lung disease/CO2 retention and her daytime lethargy/confusion. She does have a history of OSA; no records available. Orders sent to start on auto BiPAP.   01/15/2022: Today - follow up Patient presents today for follow up with her daughter. She has been feeling a little bit better since she was here last. Does feel like the Trelegy has helped with her shortness of breath. She still has a daily, dry cough. Occasionally occurs at meal times but other times, unrelated to any event. She has some postnasal drainage, which her PCP just started her on flonase for. She denies any increased chest congestion, purulent sputum productive, wheezing, or increased O2 requirements. She is wearing 2 lpm most times but will increased to 3 lpm with activity, mainly when working with PT.  [facility] contacted [name] after her last visit. Unable to provide with BiPAP due to lack of previous sleep studies. Orders  placed for NIV to treat chronic hypercapnic respiratory failure due to severe underlying lung disease, which they have no received yet. She continues to have daytime sleepiness and naps often throughout the day. Also has some bouts of confusion. No worse than when she was here previously.   Allergies  Allergen Reactions   Duloxetine Hcl Other (See Comments)   Oxybutynin Other (See Comments)   Penicillins Other (See Comments)    Listed on MAR as allergy Unknown reaction    Sulfa Antibiotics Other (See  Comments)    Listed on MAR as allergy Unknown reaction    Immunization History  Administered Date(s) Administered   Fluad Quad(high Dose 65+) 12/12/2021    Past Medical History:  Diagnosis Date   Acquired hypothyroidism    Chronic diastolic heart failure (HCC)    COPD (chronic obstructive pulmonary disease) (HCC)    Essential hypertension    Hyperlipidemia    Urinary incontinence     Tobacco History: Social History   Tobacco Use  Smoking Status Former   Packs/day: 1.00   Years: 30.00   Total pack years: 30.00   Types: Cigarettes   Quit date: 1995   Years since quitting: 28.8  Smokeless Tobacco Never   Counseling given: Not Answered   Outpatient Medications Prior to Visit  Medication Sig Dispense Refill   acetaminophen (TYLENOL) 500 MG tablet Take 1,000 mg by mouth 3 (three) times daily.     albuterol (PROVENTIL) (2.5 MG/3ML) 0.083% nebulizer solution Take 2.5 mg by nebulization as needed.     albuterol (VENTOLIN HFA) 108 (90 Base) MCG/ACT inhaler Inhale 2 puffs into the lungs every 6 (six) hours as needed for wheezing or shortness of breath. 8 g 6   amLODipine (NORVASC) 5 MG tablet Take 5 mg by mouth daily.     aspirin EC 81 MG tablet Take 81 mg by mouth daily.     cetirizine (ZYRTEC) 10 MG tablet Take 10 mg by mouth daily.     clobetasol (TEMOVATE) 0.05 % external solution Apply 1 Application topically 2 (two) times daily as needed (itching).     diclofenac Sodium (VOLTAREN) 1 % GEL Apply 2 g topically every 6 (six) hours as needed (pain).     enalapril (VASOTEC) 20 MG tablet Take 20 mg by mouth 2 (two) times daily. Hold for SBP < 110     famotidine (PEPCID) 20 MG tablet Take 20 mg by mouth at bedtime.     Fluticasone-Umeclidin-Vilant (TRELEGY ELLIPTA) 100-62.5-25 MCG/ACT AEPB Inhale 1 puff into the lungs daily. 28 each 0   Glycerin-Hypromellose-PEG 400 (ARTIFICIAL TEARS) 0.2-0.2-1 % SOLN Place 1 drop into both eyes 2 (two) times daily.     hydrochlorothiazide  (HYDRODIURIL) 12.5 MG tablet Take 12.5 mg by mouth daily.     ibuprofen (ADVIL) 200 MG tablet Take 400 mg by mouth every 8 (eight) hours as needed (pain).     levothyroxine (SYNTHROID) 150 MCG tablet Take 1 tablet (150 mcg total) by mouth daily at 6 (six) AM. 30 tablet 1   magnesium hydroxide (MILK OF MAGNESIA) 400 MG/5ML suspension Take 30 mLs by mouth 2 (two) times daily as needed (constipation).     Multiple Vitamins-Minerals (ALIVE WOMENS 50+ GUMMY PO) Take 1 Dose by mouth in the morning.     Phenazopyridine HCl (AZO URINARY PAIN RELIEF) 99.5 MG TABS Take 199 mg by mouth 3 (three) times daily as needed (urinary symptoms).     senna (SENOKOT) 8.6 MG TABS tablet Take 1 tablet  by mouth 2 (two) times daily.     vitamin B-12 (CYANOCOBALAMIN) 500 MCG tablet Take 1 tablet (500 mcg total) by mouth daily.     Tiotropium Bromide-Olodaterol 2.5-2.5 MCG/ACT AERS Inhale 2 puffs into the lungs in the morning. (Patient not taking: Reported on 01/15/2022)     No facility-administered medications prior to visit.     Review of Systems:   Constitutional: No weight loss or gain, night sweats, fevers, chills, or lassitude. +fatigue, lethargy  HEENT: No headaches, difficulty swallowing, tooth/dental problems, or sore throat. No sneezing, itching, ear ache, nasal congestion, or post nasal drip CV:  + swelling in lower extremities (stable). No chest pain, orthopnea, PND, anasarca, dizziness, palpitations, syncope Resp: +shortness of breath with exertion (slight improvement); chronic cough. No wheezing. No excess mucus or change in color of mucus. No hemoptysis.  No chest wall deformity GI:  No heartburn, indigestion, abdominal pain, nausea, vomiting, diarrhea, change in bowel habits, loss of appetite, bloody stools.  Neuro: No dizziness or lightheadedness.  Psych: No depression or anxiety. Mood stable.     Physical Exam:  BP 122/70   Pulse 64   Ht 5\' 6"  (1.676 m)   Wt 217 lb (98.4 kg)   SpO2 92%   BMI  35.02 kg/m   GEN: Pleasant, interactive, chronically-ill appearing; obese; in no acute distress. HEENT:  Normocephalic and atraumatic. PERRLA. Sclera white. Nasal turbinates pink, moist and patent bilaterally. No rhinorrhea present. Oropharynx pink and moist, without exudate or edema. No lesions, ulcerations, or postnasal drip.  NECK:  Supple w/ fair ROM. No JVD present. No lymphadenopathy.   CV: RRR, no m/r/g, no peripheral edema. Pulses intact, +2 bilaterally. No cyanosis, pallor or clubbing. PULMONARY:  Unlabored, regular breathing. Diminished bilaterally A&P w/o wheezes/rales/rhonchi. No accessory muscle use.  GI: BS present and normoactive. Soft, non-tender to palpation. No organomegaly or masses detected. MSK: No erythema, warmth or tenderness. No deformities or joint swelling noted.  Neuro: A/Ox3. No focal deficits noted.   Skin: Warm, no lesions or rashe Psych: Normal affect and behavior. Judgement and thought content appropriate.     Lab Results:  CBC    Component Value Date/Time   WBC 5.3 08/25/2021 0220   RBC 3.03 (L) 08/25/2021 0220   HGB 9.8 (L) 08/25/2021 0220   HCT 30.8 (L) 08/25/2021 0220   PLT 165 08/25/2021 0220   MCV 101.7 (H) 08/25/2021 0220   MCH 32.3 08/25/2021 0220   MCHC 31.8 08/25/2021 0220   RDW 13.1 08/25/2021 0220   LYMPHSABS 0.8 08/23/2021 1340   MONOABS 0.6 08/23/2021 1340   EOSABS 0.1 08/23/2021 1340   BASOSABS 0.0 08/23/2021 1340    BMET    Component Value Date/Time   NA 134 (L) 08/25/2021 0220   K 4.0 08/25/2021 0220   CL 93 (L) 08/25/2021 0220   CO2 34 (H) 08/25/2021 0220   GLUCOSE 114 (H) 08/25/2021 0220   BUN 12 08/25/2021 0220   CREATININE 0.75 08/25/2021 0220   CALCIUM 8.7 (L) 08/25/2021 0220   GFRNONAA >60 08/25/2021 0220    BNP    Component Value Date/Time   BNP 171.1 (H) 08/23/2021 1340     Imaging:  DG SWALLOW FUNC OP MEDICARE SPEECH PATH  Result Date: 01/01/2022 Table formatting from the original result was not  included. Images from the original result were not included. Objective Swallowing Evaluation: Type of Study: MBS-Modified Barium Swallow Study  Patient Details Name: Uyen Eichholz MRN: Elpidio Eric Date of Birth: [birth year] Today's Date:  01/01/2022 Time: SLP Start Time (ACUTE ONLY): 1145 -SLP Stop Time (ACUTE ONLY): 1220 SLP Time Calculation (min) (ACUTE ONLY): 35 min Past Medical History: Past Medical History: Diagnosis Date  Acquired hypothyroidism   Chronic diastolic heart failure (HCC)   COPD (chronic obstructive pulmonary disease) (HCC)   Essential hypertension   Hyperlipidemia   Urinary incontinence  Past Surgical History: No past surgical history on file. HPI: 86 y.o. female referred for another OP MBS due to recurring episodes of coughing, generally associated with PO intake but occasionally occurring outside of meals.  Last MBS was 09/18/21. She presented with functional oropharyngeal swallowing. There was a potential Zenker's diverticulum on the right; intermittent, transient penetration of thin liquids but no aspiration; esophageal stasis of solid food barium. Pt reports ongoing unpredictable coughing spells that accompany eating.  PMHx of falls, COPD with chronic respiratory failure on 2 L of oxygen continuous, chronic diastolic dysfunction CHF, hypothyroidism, hypertension, urinary incontinence. Esophagram 09/18/21: ""Moderately degraded/limited exam. No gross esophageal stricture identified. Possible Zenker's diverticulum. Alternatively, this area of retropharyngeal increased density could be related to ligamentous ossification."  Subjective: good historian  Recommendations for follow up therapy are one component of a multi-disciplinary discharge planning process, led by the attending physician.  Recommendations may be updated based on patient status, additional functional criteria and insurance authorization. Assessment / Plan / Recommendation   01/01/2022  12:00 PM Clinical Impressions Clinical Impression Pt's  swallow function appears to be consistent with results from July 2023 MBS. She continues to present with occasional transient penetration of thin liquids (Penetration/aspiration Scale Score of 2, which is considered to be [facility]). There was no aspiration. There was timely transit of all POs through the oropharynx.  Potential Zenker's diverticulum is still apparent.  Pt swallowed a 13 mm barium pill, requiring applesauce to help transition it through the pharynx. It passed through the esophagus without hesitation. At the end of the study pt had one incident of mild coughing, which subsided immediately.  The study was reviewed with Mrs. Delancey and her daughter, [name]. We discussed the absence of any biomechanical issues that would explain her intermittent coughing episodes. She will be following up with her pulmonologist. I mentioned the possibility of cough suppression techniques as a newer method of treatment, although I don't know if she would be an appropriate candidate. She could discuss this with her pulmonologist. Otherwise, there are no further recommendations. SLP Visit Diagnosis Dysphagia, unspecified (R13.10) Impact on safety and function No limitations     01/01/2022  12:00 PM Treatment Recommendations Treatment Recommendations No treatment recommended at this time     08/24/2021   1:00 PM Prognosis Prognosis for Safe Diet Advancement Good   01/01/2022  12:00 PM Diet Recommendations SLP Diet Recommendations Regular solids;Thin liquid Liquid Administration via Straw;Cup Medication Administration Whole meds with puree     01/01/2022  12:00 PM Other Recommendations Oral Care Recommendations Oral care BID Follow Up Recommendations No SLP follow up   08/24/2021   1:00 PM Frequency and Duration  Speech Therapy Frequency (ACUTE ONLY) min 2x/week Treatment Duration 2 weeks     01/01/2022  12:00 PM Oral Phase Oral Phase [facility]    01/01/2022  12:00 PM Pharyngeal Phase Pharyngeal Phase Impaired Pharyngeal- Thin Straw Delayed  swallow initiation-pyriform sinuses;Penetration/Aspiration before swallow Pharyngeal Material enters airway, remains ABOVE vocal cords then ejected out     No data to display    [name] 01/01/2022, 1:09 PM  CLINICAL DATA:  Patient with persistent coughing episodes mainly during meals but can occur at other times. Patient also complains of heartburn. EXAM: MODIFIED BARIUM SWALLOW TECHNIQUE: Different consistencies of barium were administered orally to the patient by the Speech Pathologist. Imaging of the pharynx was performed in the lateral projection. The radiologist was present in the fluoroscopy room for this study, providing personal supervision. FLUOROSCOPY: Radiation Exposure Index (as provided by the fluoroscopic device): 11.17 mGy Kerma COMPARISON:  DG esophagus 09/18/2021 and DG swallow function 09/18/2021. FINDINGS: Vestibular  Penetration: Trace Aspiration:  None seen. Other: Barium tablet administered. Patient had some coughing with swallowing the tablet but was able to swallow the tablet. IMPRESSION: Trace vestibular penetration. Some coughing with tablet administration. Otherwise normal exam Read by: Alwyn Ren, NP Please refer to the Speech Pathologists report for complete details and recommendations. Electronically Signed   By: Amie Portland M.D.   On: 01/01/2022 12:32         No data to display          No results found for: "NITRICOXIDE"      Assessment & Plan:   COPD, severe (HCC) Severe COPD with reversibility on previous PFTs from 2021. High symptom burden; lives a relatively sedentary lifestyle. She has received some improvement with step up to Trelegy. We will keep her on triple therapy for control regimen. Continue PRN SABA. Advised she use mucinex to help loosen phlegm and possibly reduce cough burden. She was hesitant about NIV. Worried it will disrupt her sleep. Again, discussed the correlation between her advanced lung disease and  daytime symptoms. We also discussed that this poses increased risk for readmission to the hospital. She verbalized understanding and was agreeable to plan.   Patient Instructions  Continue Albuterol inhaler 2 puffs or 3 mL neb every 6 hours as needed for shortness of breath or wheezing. Notify if symptoms persist despite rescue inhaler/neb use.  Continue zyrtec 1 tab daily for allergies  Continue supplemental oxygen 2 lpm with rest and 3 lpm with activity for goal >88-90% Continue Trelegy 1 puff daily. Brush tongue and rinse mouth afterwards.  Continue fluticasone nasal spray 2 sprays each nostril daily  Benzonatate 1 capsule Three times a day for cough Guaifenesin (mucinex) 600 mg Twice daily for chest congestion/cough   Start on non-invasive ventilation At bedtime once received from [facility]; use every night and with naps.    Follow up in 3 months with Dr. Celine Mans (1st) or Katie Leilynn Pilat,NP. If symptoms do not improve or worsen, please contact office for sooner follow up or seek emergency care.    Chronic respiratory failure with hypoxia and hypercapnia (HCC) She has hypercapnia with CO2 61 on ABG secondary to severe COPD and CHF.   The patient continues to exhibit signs of hypercapnea associated with chronic respiratory failure secondary to severe COPD and hemidiaphragm paralysis.  Interruption or failure to provide NIV would quickly lead to exacerbation of the patient's condition, hospital readmission, and likely harm the patient.  Continued use is preferred.  The use of the NIV will treat the patient's PCO2 levels and can reduce the risk of exacerbations and future hospitalizations when used at night and during the day.  Bilevel/RAD therapy with and without a rate would be ineffective as the patient requires a volume targeted mode.  Ventilation is required to decrease the work of breathing and improve pulmonary status.  Interruption of ventilator support would lead to a decline of health status.   Patient is able  to protect their airways and clear secretions on their own. Orders placed for NIV/Trilogy; will follow up with [facility].   Chronic cough Likely multifactorial r/t chronic bronchitis, GERD and postnasal drainage. She is on H2 blocker for GERD control; no breakthrough symptoms. Encouraged her to continue using fluticasone to target postnasal drainage and see if this helps improve her cough. I have provided her with benzonatate to assist with cough control.     I spent 38 minutes of dedicated to the care of this patient on the date of this encounter to include pre-visit review of records, face-to-face time with the patient discussing conditions above, post visit ordering of testing, clinical documentation with the electronic health record, making appropriate referrals as documented, and communicating necessary findings to members of the patients care team.  Noemi ChapelKatherine V Bane Hagy, NP 01/15/2022  Pt aware and understands NP's role.

## 2022-01-15 NOTE — Assessment & Plan Note (Signed)
Likely multifactorial r/t chronic bronchitis, GERD and postnasal drainage. She is on H2 blocker for GERD control; no breakthrough symptoms. Encouraged her to continue using fluticasone to target postnasal drainage and see if this helps improve her cough. I have provided her with benzonatate to assist with cough control.

## 2022-01-15 NOTE — Patient Instructions (Addendum)
Continue Albuterol inhaler 2 puffs or 3 mL neb every 6 hours as needed for shortness of breath or wheezing. Notify if symptoms persist despite rescue inhaler/neb use.  Continue zyrtec 1 tab daily for allergies  Continue supplemental oxygen 2 lpm with rest and 3 lpm with activity for goal >88-90% Continue Trelegy 1 puff daily. Brush tongue and rinse mouth afterwards.  Continue fluticasone nasal spray 2 sprays each nostril daily  Benzonatate 1 capsule Three times a day for cough Guaifenesin (mucinex) 600 mg Twice daily for chest congestion/cough   Start on non-invasive ventilation At bedtime once received from [place]; use every night and with naps.    Follow up in 3 months with Dr. Shearon Stalls (1st) or Katie Finnley [place],NP. If symptoms do not improve or worsen, please contact office for sooner follow up or seek emergency care.

## 2022-01-15 NOTE — Assessment & Plan Note (Addendum)
Severe COPD with reversibility on previous PFTs from 2021. High symptom burden; lives a relatively sedentary lifestyle. She has received some improvement with step up to Trelegy. We will keep her on triple therapy for control regimen. Continue PRN SABA. Advised she use mucinex to help loosen phlegm and possibly reduce cough burden. She was hesitant about NIV. Worried it will disrupt her sleep. Again, discussed the correlation between her advanced lung disease and daytime symptoms. We also discussed that this poses increased risk for readmission to the hospital. She verbalized understanding and was agreeable to plan.   Patient Instructions  Continue Albuterol inhaler 2 puffs or 3 mL neb every 6 hours as needed for shortness of breath or wheezing. Notify if symptoms persist despite rescue inhaler/neb use.  Continue zyrtec 1 tab daily for allergies  Continue supplemental oxygen 2 lpm with rest and 3 lpm with activity for goal >88-90% Continue Trelegy 1 puff daily. Brush tongue and rinse mouth afterwards.  Continue fluticasone nasal spray 2 sprays each nostril daily  Benzonatate 1 capsule Three times a day for cough Guaifenesin (mucinex) 600 mg Twice daily for chest congestion/cough   Start on non-invasive ventilation At bedtime once received from [place]; use every night and with naps.    Follow up in 3 months with Dr. Shearon Stalls (1st) or Katie Genevieve Ritzel,NP. If symptoms do not improve or worsen, please contact office for sooner follow up or seek emergency care.

## 2022-01-21 NOTE — Telephone Encounter (Signed)
I signed paperwork last week and I believe [facility] faxed it back

## 2022-01-21 NOTE — Telephone Encounter (Signed)
Katie and [place],   Please advise if you have seen any recent paperwork from [facility] in regards to this patient. Thanks!

## 2022-02-04 NOTE — Telephone Encounter (Signed)
Prescription was sent to [facility] via fax.

## 2022-02-10 ENCOUNTER — Other Ambulatory Visit: Payer: Self-pay | Admitting: Psychiatry

## 2022-02-12 ENCOUNTER — Other Ambulatory Visit: Payer: Self-pay | Admitting: Psychiatry

## 2022-04-25 ENCOUNTER — Ambulatory Visit: Payer: Medicare Other | Admitting: Psychiatry

## 2022-05-10 ENCOUNTER — Other Ambulatory Visit: Payer: Self-pay | Admitting: Physical Medicine and Rehabilitation

## 2022-05-10 DIAGNOSIS — M5416 Radiculopathy, lumbar region: Secondary | ICD-10-CM

## 2022-05-10 DIAGNOSIS — M48062 Spinal stenosis, lumbar region with neurogenic claudication: Secondary | ICD-10-CM

## 2022-05-21 ENCOUNTER — Ambulatory Visit: Payer: Self-pay

## 2022-05-21 ENCOUNTER — Ambulatory Visit (INDEPENDENT_AMBULATORY_CARE_PROVIDER_SITE_OTHER): Payer: Medicare Other | Admitting: Physical Medicine and Rehabilitation

## 2022-05-21 VITALS — BP 114/70 | HR 60

## 2022-05-21 DIAGNOSIS — M5416 Radiculopathy, lumbar region: Secondary | ICD-10-CM | POA: Diagnosis not present

## 2022-05-21 MED ORDER — METHYLPREDNISOLONE ACETATE 80 MG/ML IJ SUSP
80.0000 mg | Freq: Once | INTRAMUSCULAR | Status: AC
  Administered 2022-05-21: 80 mg

## 2022-05-21 NOTE — Patient Instructions (Signed)

## 2022-05-21 NOTE — Progress Notes (Signed)
Functional Pain Scale - descriptive words and definitions  Moderate (4)   Constantly aware of pain, can complete ADLs with modification/sleep marginally affected at times/passive distraction is of no use, but active distraction gives some relief. Moderate range order  Average Pain  varies   +Driver, -BT, -Dye Allergies.  Lower back pain on both sides with radiation in both hips

## 2022-05-23 NOTE — Telephone Encounter (Signed)
Please advise if this was received.

## 2022-05-23 NOTE — Telephone Encounter (Signed)
I do not have anything for this patient.

## 2022-05-28 NOTE — Progress Notes (Signed)
Valerie Bowman - 87 y.o. female MRN [id number]  Date of birth: 1935-07-26  Office Visit Note: Visit Date: 05/21/2022 PCP: Belva Agee, NP Referred by: Belva Agee, NP  Subjective: Chief Complaint  Patient presents with   Lower Back - Pain   HPI:  Valerie Bowman is a 87 y.o. female who comes in today for planned repeat Bilateral L3-4  Lumbar Transforaminal epidural steroid injection with fluoroscopic guidance.  The patient has failed conservative care including home exercise, medications, time and activity modification.  This injection will be diagnostic and hopefully therapeutic.  Please see requesting physician notes for further details and justification. Patient received more than 50% pain relief from prior injection.   Referring: Barnet Pall, FNP and Dr. Joni Fears   ROS Otherwise per HPI.  Assessment & Plan: Visit Diagnoses:    ICD-10-CM   1. Lumbar radiculopathy  M54.16 XR C-ARM NO REPORT    Epidural Steroid injection    methylPREDNISolone acetate (DEPO-MEDROL) injection 80 mg      Plan: No additional findings.   Meds & Orders:  Meds ordered this encounter  Medications   methylPREDNISolone acetate (DEPO-MEDROL) injection 80 mg    Orders Placed This Encounter  Procedures   XR C-ARM NO REPORT   Epidural Steroid injection    Follow-up: Return for visit to requesting provider as needed.   Procedures: No procedures performed  Lumbosacral Transforaminal Epidural Steroid Injection - Sub-Pedicular Approach with Fluoroscopic Guidance  Patient: Valerie Bowman      Date of Birth: 04/07/1935 MRN: [id number] PCP: Belva Agee, NP      Visit Date: 05/21/2022   Universal Protocol:    Date/Time: 05/21/2022  Consent Given By: the patient  Position: PRONE  Additional Comments: Vital signs were monitored before and after the procedure. Patient was prepped and draped in the usual sterile fashion. The correct patient, procedure, and site was verified.   Injection  Procedure Details:   Procedure diagnoses: Lumbar radiculopathy [M54.16]    Meds Administered:  Meds ordered this encounter  Medications   methylPREDNISolone acetate (DEPO-MEDROL) injection 80 mg    Laterality: Bilateral  Location/Site: L3  Needle:6.0 in., 22 ga.  Short bevel or Quincke spinal needle  Needle Placement: Transforaminal  Findings:    -Comments: Excellent flow of contrast along the nerve, nerve root and into the epidural space.  Procedure Details: After squaring off the end-plates to get a true AP view, the C-arm was positioned so that an oblique view of the foramen as noted above was visualized. The target area is just inferior to the "nose of the scotty dog" or sub pedicular. The soft tissues overlying this structure were infiltrated with 2-3 ml. of 1% Lidocaine without Epinephrine.  The spinal needle was inserted toward the target using a "trajectory" view along the fluoroscope beam.  Under AP and lateral visualization, the needle was advanced so it did not puncture dura and was located close the 6 O'Clock position of the pedical in AP tracterory. Biplanar projections were used to confirm position. Aspiration was confirmed to be negative for CSF and/or blood. A 1-2 ml. volume of Isovue-250 was injected and flow of contrast was noted at each level. Radiographs were obtained for documentation purposes.   After attaining the desired flow of contrast documented above, a 0.5 to 1.0 ml test dose of 0.25% Marcaine was injected into each respective transforaminal space.  The patient was observed for 90 seconds post injection.  After no sensory deficits were reported, and  normal lower extremity motor function was noted,   the above injectate was administered so that equal amounts of the injectate were placed at each foramen (level) into the transforaminal epidural space.   Additional Comments:  The patient tolerated the procedure well Dressing: 2 x 2 sterile gauze and Band-Aid     Post-procedure details: Patient was observed during the procedure. Post-procedure instructions were reviewed.  Patient left the clinic in stable condition.    Clinical History: No specialty comments available.     Objective:  VS:  HT:    WT:   BMI:     BP:114/70  HR:60bpm  TEMP: ( )  RESP:  Physical Exam Vitals and nursing note reviewed.  Constitutional:      General: She is not in acute distress.    Appearance: Normal appearance. She is not ill-appearing.  HENT:     Head: Normocephalic and atraumatic.     Right Ear: External ear normal.     Left Ear: External ear normal.  Eyes:     Extraocular Movements: Extraocular movements intact.  Cardiovascular:     Rate and Rhythm: Normal rate.     Pulses: Normal pulses.  Pulmonary:     Effort: Pulmonary effort is normal. No respiratory distress.  Abdominal:     General: There is no distension.     Palpations: Abdomen is soft.  Musculoskeletal:        General: Tenderness present.     Cervical back: Neck supple.     Right lower leg: No edema.     Left lower leg: No edema.     Comments: Patient has good distal strength with no pain over the greater trochanters.  No clonus or focal weakness.  Skin:    Findings: No erythema, lesion or rash.  Neurological:     General: No focal deficit present.     Mental Status: She is alert and oriented to person, place, and time.     Sensory: No sensory deficit.     Motor: No weakness or abnormal muscle tone.     Coordination: Coordination normal.  Psychiatric:        Mood and Affect: Mood normal.        Behavior: Behavior normal.      Imaging: No results found.

## 2022-05-28 NOTE — Procedures (Signed)
Lumbosacral Transforaminal Epidural Steroid Injection - Sub-Pedicular Approach with Fluoroscopic Guidance  Patient: Valerie Bowman      Date of Birth: Mar 09, 1936 MRN: [id number] PCP: Belva Agee, NP      Visit Date: 05/21/2022   Universal Protocol:    Date/Time: 05/21/2022  Consent Given By: the patient  Position: PRONE  Additional Comments: Vital signs were monitored before and after the procedure. Patient was prepped and draped in the usual sterile fashion. The correct patient, procedure, and site was verified.   Injection Procedure Details:   Procedure diagnoses: Lumbar radiculopathy [M54.16]    Meds Administered:  Meds ordered this encounter  Medications   methylPREDNISolone acetate (DEPO-MEDROL) injection 80 mg    Laterality: Bilateral  Location/Site: L3  Needle:6.0 in., 22 ga.  Short bevel or Quincke spinal needle  Needle Placement: Transforaminal  Findings:    -Comments: Excellent flow of contrast along the nerve, nerve root and into the epidural space.  Procedure Details: After squaring off the end-plates to get a true AP view, the C-arm was positioned so that an oblique view of the foramen as noted above was visualized. The target area is just inferior to the "nose of the scotty dog" or sub pedicular. The soft tissues overlying this structure were infiltrated with 2-3 ml. of 1% Lidocaine without Epinephrine.  The spinal needle was inserted toward the target using a "trajectory" view along the fluoroscope beam.  Under AP and lateral visualization, the needle was advanced so it did not puncture dura and was located close the 6 O'Clock position of the pedical in AP tracterory. Biplanar projections were used to confirm position. Aspiration was confirmed to be negative for CSF and/or blood. A 1-2 ml. volume of Isovue-250 was injected and flow of contrast was noted at each level. Radiographs were obtained for documentation purposes.   After attaining the desired flow  of contrast documented above, a 0.5 to 1.0 ml test dose of 0.25% Marcaine was injected into each respective transforaminal space.  The patient was observed for 90 seconds post injection.  After no sensory deficits were reported, and normal lower extremity motor function was noted,   the above injectate was administered so that equal amounts of the injectate were placed at each foramen (level) into the transforaminal epidural space.   Additional Comments:  The patient tolerated the procedure well Dressing: 2 x 2 sterile gauze and Band-Aid    Post-procedure details: Patient was observed during the procedure. Post-procedure instructions were reviewed.  Patient left the clinic in stable condition.

## 2022-05-30 ENCOUNTER — Other Ambulatory Visit (INDEPENDENT_AMBULATORY_CARE_PROVIDER_SITE_OTHER): Payer: Medicare Other

## 2022-05-30 ENCOUNTER — Ambulatory Visit (INDEPENDENT_AMBULATORY_CARE_PROVIDER_SITE_OTHER): Payer: Medicare Other | Admitting: Physical Medicine and Rehabilitation

## 2022-05-30 ENCOUNTER — Encounter: Payer: Self-pay | Admitting: Physical Medicine and Rehabilitation

## 2022-05-30 DIAGNOSIS — M25511 Pain in right shoulder: Secondary | ICD-10-CM

## 2022-05-30 DIAGNOSIS — M25512 Pain in left shoulder: Secondary | ICD-10-CM

## 2022-05-30 DIAGNOSIS — M542 Cervicalgia: Secondary | ICD-10-CM

## 2022-05-30 DIAGNOSIS — G8929 Other chronic pain: Secondary | ICD-10-CM

## 2022-05-30 DIAGNOSIS — M7918 Myalgia, other site: Secondary | ICD-10-CM

## 2022-05-30 DIAGNOSIS — R269 Unspecified abnormalities of gait and mobility: Secondary | ICD-10-CM | POA: Diagnosis not present

## 2022-05-30 NOTE — Progress Notes (Signed)
   05/30/22 1308  [place] in the past year? 0  Number of falls in past year 0  Was there an injury with Fall? 0  Fall Risk Category Calculator 0  Patient Fall Risk Level Low Fall Risk  Fall Risk  Patient at Risk for Falls Due to Impaired mobility;Impaired balance/gait  Fall risk Follow up Falls evaluation completed;Education provided

## 2022-05-30 NOTE — Progress Notes (Signed)
Functional Pain Scale - descriptive words and definitions  Moderate (4)   Constantly aware of pain, can complete ADLs with modification/sleep marginally affected at times/passive distraction is of no use, but active distraction gives some relief. Moderate range order  Average Pain  varies  Neck pain on both sides that radiates into the shoulders. Standing up and walking around makes the pain worse

## 2022-05-30 NOTE — Progress Notes (Signed)
Valerie Bowman - 87 y.o. female MRN [id number]  Date of birth: 03/27/1935  Office Visit Note: Visit Date: 05/30/2022 PCP: Belva Agee, NP Referred by: Belva Agee, NP  Subjective: Chief Complaint  Patient presents with   Neck - Pain   HPI: Keviona Kennebeck is a 87 y.o. female who comes in today for evaluation of chronic, worsening and severe bilateral neck pain radiating to shoulders and upper back. Patient is well known to [name]. She is somewhat of poor historian, her daughter is accompanying her during our visit today. Pain ongoing for several months, worsens with standing and movement of neck. She describes pain as sore and aching, currently rates as 8 out of 10. Some relief of pain with home exercise regimen, rest and use of medications. Good relief with Ibuprofen and Voltaren topical cream. She does reside at [facility] assisted living facility. History of PT within facility, states this did help to alleviate neck pain. She reports all over body pain most days. History of multiple lumbar epidural steroid injections in our office over the years with intermittent relief of pain. Most recent was bilateral L3 transforaminal epidural steroid injection on 05/21/2022, no relief of pain with this procedure. We have also tried facet joint blocks in the past with minimal relief of pain. History of COPD, patient is oxygen dependent, wears 2L O2 via nasal cannula. She does ambulate with cane. Patient denies focal weakness, numbness and tingling. No recent trauma or falls.        Review of Systems  Musculoskeletal:  Positive for myalgias and neck pain.  Neurological:  Negative for tingling, sensory change, focal weakness and weakness.  All other systems reviewed and are negative.  Otherwise per HPI.  Assessment & Plan: Visit Diagnoses:    ICD-10-CM   1. Cervicalgia  M54.2 XR Cervical Spine 2 or 3 views    2. Chronic pain of both shoulders  M25.511    G89.29    M25.512     3. Myofascial pain  syndrome  M79.18     4. Gait abnormality  R26.9        Plan: Findings:  Chronic, worsening and severe bilateral neck pain radiating to shoulders and upper back. Patient continues to have severe pain despite good conservative therapies such as physical therapy, home exercise regimen, rest and use of medications. Patients clinical presentation and exam are consistent with myofascial pain. Tenderness and multiple palpable trigger points noted to bilateral cervical paraspinal regions, levator scapulae and trapezius muscles. I did obtain 2 view cervical x-rays in the office today that exhibit loss of normal cervical lordosis, grade 1 anterolisthesis of L3 on L4, facet arthropathy to lower cervical spine. Next step is to have patient re-group with physical therapy at [facility]. I did provide daughter with hand written order for PT that she will take to facility. In regards to her generalized body pain we recommend she follow up with her PCP for further treatment. This could be a type of central sensitization syndrome such as fibromyalgia. Historically, she has not been able to tolerate narcotic pain medications due to drowsiness and hallucinations. Continue to monitor chronic lower back issues. She can follow up with [name] as needed. Patient denies focal weakness, numbness and tingling. No recent trauma or falls.     Meds & Orders: No orders of the defined types were placed in this encounter.   Orders Placed This Encounter  Procedures   XR Cervical Spine 2 or 3 views  Follow-up: Return if symptoms worsen or fail to improve.   Procedures: No procedures performed      Clinical History: No specialty comments available.   She reports that she quit smoking about 29 years ago. Her smoking use included cigarettes. She has a 30.00 pack-year smoking history. She has never used smokeless tobacco. No results for input(s): "HGBA1C", "LABURIC" in the last 8760 hours.  Objective:  VS:  HT:    WT:   BMI:      BP:   HR: bpm  TEMP: ( )  RESP:  Physical Exam Vitals and nursing note reviewed.  HENT:     Head: Normocephalic and atraumatic.     Right Ear: External ear normal.     Left Ear: External ear normal.     Nose: Nose normal.     Mouth/Throat:     Mouth: Mucous membranes are moist.  Eyes:     Extraocular Movements: Extraocular movements intact.  Cardiovascular:     Rate and Rhythm: Normal rate.     Pulses: Normal pulses.  Pulmonary:     Effort: Pulmonary effort is normal.  Abdominal:     General: Abdomen is flat. There is no distension.  Musculoskeletal:        General: Tenderness present.     Cervical back: Tenderness present.     Comments: Discomfort noted with flexion, extension and side-to-side rotation. Patient has good strength in the upper extremities including 5 out of 5 strength in wrist extension, long finger flexion and APB.  There is no atrophy of the hands intrinsically.  Sensation intact bilaterally. Tenderness and multiple palpable trigger points noted to bilateral cervical paraspinal regions, levator scapulae and trapezius muscles. Negative Hoffman's sign. Negative Spurling's sign.    Skin:    General: Skin is warm and dry.     Capillary Refill: Capillary refill takes less than 2 seconds.  Neurological:     General: No focal deficit present.     Mental Status: She is alert and oriented to person, place, and time.  Psychiatric:        Mood and Affect: Mood normal.        Behavior: Behavior normal.     Ortho Exam  Imaging: No results found.  Past Medical/Family/Surgical/Social History: Medications & Allergies reviewed per EMR, new medications updated. Patient Active Problem List   Diagnosis Date Noted   Chronic cough 01/15/2022   OSA (obstructive sleep apnea) 12/19/2021   Chronic respiratory failure with hypoxia and hypercapnia (HCC) 123XX123   Acute metabolic encephalopathy 123XX123   Pain in right shoulder 03/20/2021   SOB (shortness of breath)  02/28/2021   Acquired hypothyroidism    Essential hypertension    Chronic diastolic heart failure (HCC)    COPD, severe ([place]) 02/27/2021   Low back pain 02/20/2021   Past Medical History:  Diagnosis Date   Acquired hypothyroidism    Chronic diastolic heart failure (HCC)    COPD (chronic obstructive pulmonary disease) ([place])    Essential hypertension    Hyperlipidemia    Urinary incontinence    Family History  Problem Relation Age of Onset   Lung disease Neg Hx    History reviewed. No pertinent surgical history. Social History   Occupational History   Not on file  Tobacco Use   Smoking status: Former    Packs/day: 1.00    Years: 30.00    Additional pack years: 0.00    Total pack years: 30.00    Types: Cigarettes  Quit date: 31    Years since quitting: 29.2   Smokeless tobacco: Never  Substance and Sexual Activity   Alcohol use: Not Currently   Drug use: Never   Sexual activity: Not on file

## 2022-07-18 ENCOUNTER — Ambulatory Visit: Payer: Medicare Other | Admitting: Internal Medicine

## 2022-07-22 ENCOUNTER — Encounter: Payer: Self-pay | Admitting: Internal Medicine

## 2022-07-22 ENCOUNTER — Ambulatory Visit (INDEPENDENT_AMBULATORY_CARE_PROVIDER_SITE_OTHER): Payer: Medicare Other | Admitting: Internal Medicine

## 2022-07-22 VITALS — BP 122/70 | HR 67 | Temp 97.6°F | Ht 66.0 in | Wt 240.0 lb

## 2022-07-22 DIAGNOSIS — J9611 Chronic respiratory failure with hypoxia: Secondary | ICD-10-CM | POA: Diagnosis not present

## 2022-07-22 DIAGNOSIS — J9612 Chronic respiratory failure with hypercapnia: Secondary | ICD-10-CM

## 2022-07-22 DIAGNOSIS — J4489 Other specified chronic obstructive pulmonary disease: Secondary | ICD-10-CM

## 2022-07-22 DIAGNOSIS — R053 Chronic cough: Secondary | ICD-10-CM

## 2022-07-22 DIAGNOSIS — K219 Gastro-esophageal reflux disease without esophagitis: Secondary | ICD-10-CM

## 2022-07-22 DIAGNOSIS — R0982 Postnasal drip: Secondary | ICD-10-CM

## 2022-07-22 DIAGNOSIS — J439 Emphysema, unspecified: Secondary | ICD-10-CM

## 2022-07-22 MED ORDER — PANTOPRAZOLE SODIUM 40 MG PO TBEC: 40.0000 mg | DELAYED_RELEASE_TABLET | Freq: Two times a day (BID) | ORAL | 5 refills | Status: DC

## 2022-07-22 NOTE — Progress Notes (Signed)
Valerie Bowman    [id number]    [birth year]  Primary Care Physician:Tackett, Charlesetta Shanks, NP Date of Appointment: 07/22/2022 Established Patient Visit  Chief complaint:   Chief Complaint  Patient presents with   Follow-up    cough     HPI: Valerie Bowman is a 87 y.o. woman with COPD and chronic hypoxemic and hypercapnic respiratory failure on oxygen and nocturnal NIV.   Interval Updates: Here for follow up.  Saw KC for follow up. NIV/Trilogy vent was ordered. She has received this and has had a dramatic improvement. No further episodes of somnolence/lethargy attributed to hypercapnia.  She is wearing 2LPM . She continues to live at [facility] assisted living.    She has chronic cough secondary to post nasal drainage, GERD, and COPD. Cough is usually associated with food. Has had two swallow evaluations and no aspiration. She denies choking. Once it starts it goes on for a while. No nocturnal symptoms. With all food consistencies. No emesis. No worsening with trelegy inhaler.   She does have post nasal drainage and rhinorrhea and this is improved with fluticasone.   She is also having some neck pain.   I have reviewed the patient's family social and past medical history and updated as appropriate.   Past Medical History:  Diagnosis Date   Acquired hypothyroidism    Chronic diastolic heart failure (HCC)    COPD (chronic obstructive pulmonary disease) (HCC)    Essential hypertension    Hyperlipidemia    Urinary incontinence     History reviewed. No pertinent surgical history.  Family History  Problem Relation Age of Onset   Lung disease Neg Hx     Social History   Occupational History   Not on file  Tobacco Use   Smoking status: Former    Packs/day: 1.00    Years: 30.00    Additional pack years: 0.00    Total pack years: 30.00    Types: Cigarettes    Quit date: 1995    Years since quitting: 29.3   Smokeless tobacco: Never  Substance and Sexual Activity    Alcohol use: Not Currently   Drug use: Never   Sexual activity: Not on file     Physical Exam: Blood pressure 122/70, pulse 67, temperature 97.6 F (36.4 C), temperature source Oral, height 5\' 6"  (1.676 m), weight 240 lb (108.9 kg), SpO2 98 %.  Gen:      No acute distress, obese ENT:  mallampati IV, no thrush, no nasal polyps, mucus membranes moist Lungs:    kyphosis, No increased respiratory effort, symmetric chest wall excursion, clear to auscultation bilaterally, no wheezes or crackles CV:         Regular rate and rhythm; no murmurs, rubs, or gallops.  No pedal edema   Data Reviewed: Imaging: I have personally reviewed the esophageal study October 2023 - no overt aspiration  PFTs:   Labs: Lab Results  Component Value Date   NA 134 (L) 08/25/2021   K 4.0 08/25/2021   CO2 34 (H) 08/25/2021   GLUCOSE 114 (H) 08/25/2021   BUN 12 08/25/2021   CREATININE 0.75 08/25/2021   CALCIUM 8.7 (L) 08/25/2021   GFRNONAA >60 08/25/2021   Lab Results  Component Value Date   WBC 5.3 08/25/2021   HGB 9.8 (L) 08/25/2021   HCT 30.8 (L) 08/25/2021   MCV 101.7 (H) 08/25/2021   PLT 165 08/25/2021    Immunization status: Immunization History  Administered Date(s) Administered   Fluad Quad(high Dose 65+) 12/12/2021    External Records Personally Reviewed: pulmonary  Assessment:  Chronic respiratory failure with hypoxemia and hypercapnia Severe COPD Chronic Cough, not improving problem. Chronic Rhinitis GERD  Plan/Recommendations: Suspect reflux is related to reflux. She has had MBS negative for aspiration. Stop pepcid. Switch protonix 1 pill twice a day before meals.  Let's see if this helps your cough. Otherwise consider neurogenic cough, vocal cord dysfunction  Continue cetirizine and flonase for rhinitis.    Continue Trelegy inhaler 1 puff once a day.  Continue albuterol inhaler as needed.   Continue using the ventilator at night. I will try to review the download  through [employer].    We discussed at length the importance of wearing NIV while sleeping and with naps.   Return to Care: Return in about 2 months (around 09/21/2022).   Durel Salts, MD Pulmonary and Critical Care Medicine [facility] Office:[phone]

## 2022-07-22 NOTE — Patient Instructions (Addendum)
Please schedule follow up scheduled with myself in 2 months.  If my schedule is not open yet, we will contact you with a reminder closer to that time. Please call [phone] if you haven't heard from [name] a month before.   Stop pepcid. Switch protonix 1 pill twice a day before meals.  Let's see if this helps your cough.   Continue Trelegy inhaler 1 puff once a day.  Continue albuterol inhaler as needed.    Continue using the ventilator at night. I will try to review the download through [employer].

## 2022-10-01 ENCOUNTER — Ambulatory Visit: Payer: Medicare Other | Admitting: Internal Medicine

## 2022-10-01 ENCOUNTER — Encounter: Payer: Self-pay | Admitting: Internal Medicine

## 2022-10-01 VITALS — BP 116/74 | HR 60 | Ht 66.0 in | Wt 249.0 lb

## 2022-10-01 DIAGNOSIS — I509 Heart failure, unspecified: Secondary | ICD-10-CM | POA: Diagnosis not present

## 2022-10-01 DIAGNOSIS — J4489 Other specified chronic obstructive pulmonary disease: Secondary | ICD-10-CM

## 2022-10-01 DIAGNOSIS — J9611 Chronic respiratory failure with hypoxia: Secondary | ICD-10-CM

## 2022-10-01 DIAGNOSIS — R053 Chronic cough: Secondary | ICD-10-CM | POA: Diagnosis not present

## 2022-10-01 DIAGNOSIS — J9612 Chronic respiratory failure with hypercapnia: Secondary | ICD-10-CM

## 2022-10-01 DIAGNOSIS — J439 Emphysema, unspecified: Secondary | ICD-10-CM

## 2022-10-01 MED ORDER — FUROSEMIDE 20 MG PO TABS: 20.0000 mg | ORAL_TABLET | Freq: Every day | ORAL | 5 refills | Status: DC

## 2022-10-01 NOTE — Patient Instructions (Addendum)
Please schedule follow up scheduled with myself in 3 months.  If my schedule is not open yet, we will contact you with a reminder closer to that time. Please call [phone] if you haven't heard from [name] a month before.   Your breathing is likely worse with the swelling in your legs.   Please start taking lasix 20 mg daily in the morning for the next two weeks or until the swelling is resolved.  You will need blood work to check your kidney function and electrolytes one week after starting the lasix. Please have your primary care NP check this.  You may be able to back off the lasix to as needed or every other day once the fluid here is off.   I am ordering an ultrasound of your heart - our office will schedule this and call you. Depending on the results I may consider referring you to a heart doctor.   Continue the nocturnal trilogy vent. I will review the download and contact you if any changes need to be made.   Continue trelegy and albuterol.

## 2022-10-01 NOTE — Progress Notes (Signed)
Valerie Bowman    [id number]    July 04, 1935  Primary Care Physician:Tackett, Charlesetta Shanks, NP Date of Appointment: 10/01/2022 Established Patient Visit  Chief complaint:   Chief Complaint  Patient presents with   Follow-up    Breathing is unchanged. She is coughing less but severity of the cough is unchanged. Cough is non prod. She is using her albuterol inhaler twice daily and albuterol neb about 3 x per day.      HPI: Valerie Bowman is a 87 y.o. woman with COPD and chronic hypoxemic and hypercapnic respiratory failure on oxygen and nocturnal NIV.   Interval Updates: Here for COPD follow up.   Continues to do well on NIV nocturnally. On 2LPM nasal cannula otherwise.   She is taking hydrochlorothiazide 25 mg (recently increased from 12.5 mg.)  she has had progressive LE edema and 10 lb weight gain since she was here two months ago. Her legs feel like balloons and she feels weak and like she can't walk.   Reportedly had blood work checked at [facility] by pcp and was normal last week except her tsh.   Still taking PPI for cough.   I have reviewed the patient's family social and past medical history and updated as appropriate.   Past Medical History:  Diagnosis Date   Acquired hypothyroidism    Chronic diastolic heart failure (HCC)    COPD (chronic obstructive pulmonary disease) (HCC)    Essential hypertension    Hyperlipidemia    Urinary incontinence     History reviewed. No pertinent surgical history.  Family History  Problem Relation Age of Onset   Lung disease Neg Hx     Social History   Occupational History   Not on file  Tobacco Use   Smoking status: Former    Current packs/day: 0.00    Average packs/day: 1 pack/day for 30.0 years (30.0 ttl pk-yrs)    Types: Cigarettes    Start date: 51    Quit date: 1995    Years since quitting: 29.5   Smokeless tobacco: Never  Substance and Sexual Activity   Alcohol use: Not Currently   Drug use: Never   Sexual  activity: Not on file     Physical Exam: Blood pressure 116/74, pulse 60, height 5\' 6"  (1.676 m), weight 249 lb (112.9 kg), SpO2 96%.  Gen:      No acute distress, obese in wheelchair on nasal cannula ENT:  mallampati IV, no thrush, no nasal polyps, mucus membranes moist Lungs:    kyphosis, diminished, no wheezes CV:        RRR 2+ pitting le edema   Data Reviewed: Imaging: I have personally reviewed the esophageal study October 2023 - no overt aspiration  PFTs:   Labs: Lab Results  Component Value Date   NA 134 (L) 08/25/2021   K 4.0 08/25/2021   CO2 34 (H) 08/25/2021   GLUCOSE 114 (H) 08/25/2021   BUN 12 08/25/2021   CREATININE 0.75 08/25/2021   CALCIUM 8.7 (L) 08/25/2021   GFRNONAA >60 08/25/2021   Lab Results  Component Value Date   WBC 5.3 08/25/2021   HGB 9.8 (L) 08/25/2021   HCT 30.8 (L) 08/25/2021   MCV 101.7 (H) 08/25/2021   PLT 165 08/25/2021    Immunization status: Immunization History  Administered Date(s) Administered   Fluad Quad(high Dose 65+) 12/12/2021   PNEUMOCOCCAL CONJUGATE-20 03/11/2018    External Records Personally Reviewed: pulmonary  Assessment:  Acute decompensated HF Chronic respiratory failure with hypoxemia and hypercapnia Severe COPD Chronic Rhinitis GERD  Plan/Recommendations:  Your breathing is likely worse with the swelling in your legs.   Please start taking lasix 20 mg daily in the morning for the next two weeks or until the swelling is resolved.  You will need blood work to check your kidney function and electrolytes one week after starting the lasix. Please have your primary care NP check this.  You may be able to back off the lasix to as needed or every other day once the fluid here is off.   I am ordering an ultrasound of your heart - our office will schedule this and call you. Depending on the results I may consider referring you to a heart doctor.   Continue the nocturnal trilogy vent. I will review the  download and contact you if any changes need to be made.   Continue trelegy and albuterol.    Return to Care: Return in about 3 months (around 01/01/2023).   Durel Salts, MD Pulmonary and Critical Care Medicine [facility] Office:[phone]

## 2022-10-29 ENCOUNTER — Ambulatory Visit (HOSPITAL_COMMUNITY): Payer: Medicare Other | Attending: Cardiology

## 2022-10-29 DIAGNOSIS — J439 Emphysema, unspecified: Secondary | ICD-10-CM | POA: Insufficient documentation

## 2022-10-29 DIAGNOSIS — J9612 Chronic respiratory failure with hypercapnia: Secondary | ICD-10-CM | POA: Insufficient documentation

## 2022-10-29 DIAGNOSIS — I509 Heart failure, unspecified: Secondary | ICD-10-CM | POA: Insufficient documentation

## 2022-10-29 DIAGNOSIS — J9611 Chronic respiratory failure with hypoxia: Secondary | ICD-10-CM | POA: Insufficient documentation

## 2022-10-29 DIAGNOSIS — J4489 Other specified chronic obstructive pulmonary disease: Secondary | ICD-10-CM | POA: Insufficient documentation

## 2022-10-29 LAB — ECHOCARDIOGRAM COMPLETE
Area-P 1/2: 4.41 cm2
P 1/2 time: 438 msec
S' Lateral: 3.4 cm

## 2022-12-07 ENCOUNTER — Encounter: Payer: Self-pay | Admitting: Physical Medicine and Rehabilitation

## 2022-12-07 DIAGNOSIS — M47812 Spondylosis without myelopathy or radiculopathy, cervical region: Secondary | ICD-10-CM

## 2022-12-07 DIAGNOSIS — M542 Cervicalgia: Secondary | ICD-10-CM

## 2022-12-07 DIAGNOSIS — M4312 Spondylolisthesis, cervical region: Secondary | ICD-10-CM

## 2022-12-09 NOTE — Telephone Encounter (Signed)
MRI cervical ordered.

## 2022-12-19 ENCOUNTER — Ambulatory Visit
Admission: RE | Admit: 2022-12-19 | Discharge: 2022-12-19 | Disposition: A | Discharge status: Home / Self Care | Payer: Medicare Other | Source: Ambulatory Visit | Attending: Physical Medicine and Rehabilitation | Admitting: Physical Medicine and Rehabilitation

## 2022-12-26 ENCOUNTER — Encounter: Payer: Self-pay | Admitting: Internal Medicine

## 2022-12-26 ENCOUNTER — Ambulatory Visit: Payer: Medicare Other | Admitting: Internal Medicine

## 2022-12-26 VITALS — BP 110/70 | HR 65 | Ht 66.0 in | Wt 253.8 lb

## 2022-12-26 DIAGNOSIS — J439 Emphysema, unspecified: Secondary | ICD-10-CM

## 2022-12-26 DIAGNOSIS — J4489 Other specified chronic obstructive pulmonary disease: Secondary | ICD-10-CM

## 2022-12-26 DIAGNOSIS — J9611 Chronic respiratory failure with hypoxia: Secondary | ICD-10-CM | POA: Diagnosis not present

## 2022-12-26 DIAGNOSIS — I5033 Acute on chronic diastolic (congestive) heart failure: Secondary | ICD-10-CM

## 2022-12-26 DIAGNOSIS — J9612 Chronic respiratory failure with hypercapnia: Secondary | ICD-10-CM

## 2022-12-26 MED ORDER — FUROSEMIDE 20 MG PO TABS
40.0000 mg | ORAL_TABLET | Freq: Every day | ORAL | 4 refills | Status: DC
Start: 2022-12-26 — End: 2023-01-10

## 2022-12-26 MED ORDER — FUROSEMIDE 20 MG PO TABS: 40.0000 mg | ORAL_TABLET | Freq: Every day | ORAL | 4 refills | Status: DC

## 2022-12-26 NOTE — Progress Notes (Signed)
Valerie Bowman    [id number]    12-May-1935  Primary Care Physician:Tackett, Charlesetta Shanks, NP Date of Appointment: 12/26/2022 Established Patient Visit  Chief complaint:   Chief Complaint  Patient presents with   Follow-up    Pt is here for COPD with chronic bronchitis and emphysema F/U visit. Pt c/o Edema of both legs and Weight Gain.     HPI: Valerie Bowman is a 87 y.o. woman with COPD and chronic hypoxemic and hypercapnic respiratory failure on oxygen and nocturnal NIV.   Interval Updates: Here for COPD follow up. Still with swelling in her legs. Echo shows preserved ef, unable to determine diastolic dysfunction due to difficulty obtaining windows. Taking hydrochlorothiazide 25 mg daily with lasix 30 mg daily. This was increased by pcp and seemed to help initiatially but now swelling persists.    Continues to do well on NIV nocturnally. On 2LPM nasal cannula otherwise.   Biggest issue is neuropathy, trouble sleeping at night.  Reportedly had blood work checked at [facility] by pcp and was normal last week except her tsh.   Still taking PPI for cough. Seems improved.   I have reviewed the patient's family social and past medical history and updated as appropriate.   Past Medical History:  Diagnosis Date   Acquired hypothyroidism    Chronic diastolic heart failure (HCC)    COPD (chronic obstructive pulmonary disease) (HCC)    Essential hypertension    Hyperlipidemia    Urinary incontinence     History reviewed. No pertinent surgical history.  Family History  Problem Relation Age of Onset   Lung disease Neg Hx     Social History   Occupational History   Not on file  Tobacco Use   Smoking status: Former    Current packs/day: 0.00    Average packs/day: 1 pack/day for 30.0 years (30.0 ttl pk-yrs)    Types: Cigarettes    Start date: 33    Quit date: 1995    Years since quitting: 29.8   Smokeless tobacco: Never  Substance and Sexual Activity   Alcohol use:  Not Currently   Drug use: Never   Sexual activity: Not on file     Physical Exam: Blood pressure 110/70, pulse 65, height 5\' 6"  (1.676 m), weight 253 lb 12.8 oz (115.1 kg), SpO2 96%.  Gen:      No acute distress, obese in wheelchair on nasal cannula ENT:  mallampati IV, no thrush, no nasal polyps, mucus membranes moist Lungs:    kyphosis, diminished, no wheezes CV:        RRR 1+ pitting edema, in compression hose.    Data Reviewed: Imaging: I have personally reviewed the esophageal study October 2023 - no overt aspiration  PFTs:   Labs: Lab Results  Component Value Date   NA 134 (L) 08/25/2021   K 4.0 08/25/2021   CO2 34 (H) 08/25/2021   GLUCOSE 114 (H) 08/25/2021   BUN 12 08/25/2021   CREATININE 0.75 08/25/2021   CALCIUM 8.7 (L) 08/25/2021   GFRNONAA >60 08/25/2021   Lab Results  Component Value Date   WBC 5.3 08/25/2021   HGB 9.8 (L) 08/25/2021   HCT 30.8 (L) 08/25/2021   MCV 101.7 (H) 08/25/2021   PLT 165 08/25/2021    Immunization status: Immunization History  Administered Date(s) Administered   Fluad Quad(high Dose 65+) 12/12/2021   PNEUMOCOCCAL CONJUGATE-20 03/11/2018    External Records Personally Reviewed: pulmonary  Assessment:  Acute decompensated HF Chronic respiratory failure with hypoxemia and hypercapnia Severe COPD Chronic Rhinitis GERD  Plan/Recommendations:  The echocardiogram shows a stiff heart which squeezes well but has trouble relaxing. That is why the fluid is building up. We discussed cardiology referral but she really doesn't want to see anymore doctors than she already is.   I am increasing your lasix to 40 mg daily in the morning. Hopefully this helps the fluid. May need to increase to twice daily at some point. If you go to twice daily make sure to keep an eye on potassium and kidney function with blood work.   Continue trelegy with albuterol  Continue the vent at night and oxygen therapy.   Can speak with your PCP about  if injectable medications for weight loss would be appropriate for you.   Already had flu shot.   Return to Care: Return in about 6 months (around 06/26/2023).   Durel Salts, MD Pulmonary and Critical Care Medicine [facility] Office:[phone]

## 2022-12-26 NOTE — Patient Instructions (Addendum)
It was a pleasure to see you today!  Please schedule follow up scheduled with myself in 6 months.  If my schedule is not open yet, we will contact you with a reminder closer to that time. Please call [phone] if you haven't heard from [name] a month before, and always call us sooner if issues or concerns arise. You can also send [name] a message through MyChart, but but aware that this is not to be used for urgent issues and it may take up to 5-7 days to receive a reply. Please be aware that you will likely be able to view your results before I have a chance to respond to them. Please give [name] 5 business days to respond to any non-urgent results.   I am increasing your lasix to 40 mg daily in the morning. Hopefully this helps the fluid. May need to increase to twice daily at some point. If you go to twice daily make sure to keep an eye on potassium and kidney function with blood work.  The echocardiogram shows a stiff heart which squeezes well but has trouble relaxing. That is why the fluid is building up.   Continue trelegy with albuterol  Continue the vent at night and oxygen therapy.   Can speak with your PCP about if injectable medications for weight loss would be appropriate for you.

## 2022-12-31 ENCOUNTER — Encounter: Payer: Self-pay | Admitting: Physical Medicine and Rehabilitation

## 2023-01-09 ENCOUNTER — Inpatient Hospital Stay (HOSPITAL_COMMUNITY)
Admission: EM | Admit: 2023-01-09 | Discharge: 2023-01-14 | DRG: 563 | Disposition: A | Discharge status: Skilled Nursing Facility | Payer: Medicare Other | Source: Skilled Nursing Facility | Attending: Internal Medicine | Admitting: Internal Medicine

## 2023-01-09 ENCOUNTER — Other Ambulatory Visit: Payer: Self-pay

## 2023-01-09 ENCOUNTER — Emergency Department (HOSPITAL_COMMUNITY): Payer: Medicare Other

## 2023-01-09 ENCOUNTER — Encounter (HOSPITAL_COMMUNITY): Payer: Self-pay

## 2023-01-09 DIAGNOSIS — R053 Chronic cough: Secondary | ICD-10-CM | POA: Diagnosis present

## 2023-01-09 DIAGNOSIS — Z1152 Encounter for screening for COVID-19: Secondary | ICD-10-CM

## 2023-01-09 DIAGNOSIS — J449 Chronic obstructive pulmonary disease, unspecified: Secondary | ICD-10-CM | POA: Diagnosis present

## 2023-01-09 DIAGNOSIS — N1832 Chronic kidney disease, stage 3b: Secondary | ICD-10-CM | POA: Diagnosis present

## 2023-01-09 DIAGNOSIS — S82852A Displaced trimalleolar fracture of left lower leg, initial encounter for closed fracture: Secondary | ICD-10-CM | POA: Diagnosis not present

## 2023-01-09 DIAGNOSIS — Z7982 Long term (current) use of aspirin: Secondary | ICD-10-CM

## 2023-01-09 DIAGNOSIS — G4733 Obstructive sleep apnea (adult) (pediatric): Secondary | ICD-10-CM | POA: Diagnosis present

## 2023-01-09 DIAGNOSIS — S82892A Other fracture of left lower leg, initial encounter for closed fracture: Secondary | ICD-10-CM | POA: Diagnosis not present

## 2023-01-09 DIAGNOSIS — E785 Hyperlipidemia, unspecified: Secondary | ICD-10-CM | POA: Diagnosis present

## 2023-01-09 DIAGNOSIS — I130 Hypertensive heart and chronic kidney disease with heart failure and stage 1 through stage 4 chronic kidney disease, or unspecified chronic kidney disease: Secondary | ICD-10-CM | POA: Diagnosis present

## 2023-01-09 DIAGNOSIS — Z87891 Personal history of nicotine dependence: Secondary | ICD-10-CM

## 2023-01-09 DIAGNOSIS — Z79890 Hormone replacement therapy (postmenopausal): Secondary | ICD-10-CM

## 2023-01-09 DIAGNOSIS — M25562 Pain in left knee: Secondary | ICD-10-CM | POA: Diagnosis present

## 2023-01-09 DIAGNOSIS — Z888 Allergy status to other drugs, medicaments and biological substances status: Secondary | ICD-10-CM

## 2023-01-09 DIAGNOSIS — W19XXXA Unspecified fall, initial encounter: Secondary | ICD-10-CM | POA: Diagnosis present

## 2023-01-09 DIAGNOSIS — S82842A Displaced bimalleolar fracture of left lower leg, initial encounter for closed fracture: Principal | ICD-10-CM

## 2023-01-09 DIAGNOSIS — N179 Acute kidney failure, unspecified: Secondary | ICD-10-CM | POA: Diagnosis present

## 2023-01-09 DIAGNOSIS — R059 Cough, unspecified: Secondary | ICD-10-CM | POA: Diagnosis present

## 2023-01-09 DIAGNOSIS — J9811 Atelectasis: Secondary | ICD-10-CM | POA: Diagnosis not present

## 2023-01-09 DIAGNOSIS — S83012A Lateral subluxation of left patella, initial encounter: Secondary | ICD-10-CM | POA: Diagnosis present

## 2023-01-09 DIAGNOSIS — Z880 Allergy status to penicillin: Secondary | ICD-10-CM

## 2023-01-09 DIAGNOSIS — I5032 Chronic diastolic (congestive) heart failure: Secondary | ICD-10-CM | POA: Diagnosis present

## 2023-01-09 DIAGNOSIS — E039 Hypothyroidism, unspecified: Secondary | ICD-10-CM | POA: Diagnosis present

## 2023-01-09 DIAGNOSIS — E538 Deficiency of other specified B group vitamins: Secondary | ICD-10-CM | POA: Diagnosis present

## 2023-01-09 DIAGNOSIS — M545 Low back pain, unspecified: Secondary | ICD-10-CM | POA: Diagnosis present

## 2023-01-09 DIAGNOSIS — J9611 Chronic respiratory failure with hypoxia: Secondary | ICD-10-CM | POA: Diagnosis present

## 2023-01-09 DIAGNOSIS — Z9981 Dependence on supplemental oxygen: Secondary | ICD-10-CM

## 2023-01-09 DIAGNOSIS — Z79899 Other long term (current) drug therapy: Secondary | ICD-10-CM

## 2023-01-09 DIAGNOSIS — K219 Gastro-esophageal reflux disease without esophagitis: Secondary | ICD-10-CM | POA: Diagnosis present

## 2023-01-09 DIAGNOSIS — Z882 Allergy status to sulfonamides status: Secondary | ICD-10-CM

## 2023-01-09 DIAGNOSIS — Z66 Do not resuscitate: Secondary | ICD-10-CM | POA: Diagnosis present

## 2023-01-09 NOTE — ED Triage Notes (Signed)
Pt BIB EMS from [facility] for a mechanical fall. Pt presents with a left ankle deformity but pedal pulses are strong according to EMS. No LOC and pt did not hit her head.   No blood thinners  A&Ox4  Side Note: The facility stated she's been having a cough lately and per EMS she has ronchi on lower bilateral lungs

## 2023-01-09 NOTE — ED Provider Notes (Signed)
[place] EMERGENCY DEPARTMENT AT [facility] Provider Note   CSN: [id number] Arrival date & time: 01/09/23  2152     History  Chief Complaint  Patient presents with   Fall        Ankle Deformity    Valerie Bowman is a 87 y.o. female.  The history is provided by the patient and medical records.  Fall   87 y.o. F with hx of COPD, CHF, COPD, HTN, presenting to the ED from SNF after a fall.  Patient is a resident at [facility], had a mechanical fall today where she states her legs just gave out.  She fell onto left side, twisted left ankle.  No head injury or LOC.  She is not on anticoagulation.  Also having some pain in left knee during exam.  She does use cane/walker when up and about at baseline.  Facility also reports patient has been coughing recently.  Apparently they did an x-ray today but did not disclose any results to EMS.  Unknown covid contacts, etc.  No fevers reported.  At baseline mental status.  Home Medications Prior to Admission medications   Medication Sig Start Date End Date Taking? Authorizing Provider  albuterol (PROVENTIL) (2.5 MG/3ML) 0.083% nebulizer solution Take 2.5 mg by nebulization as needed.    [provider]  albuterol (VENTOLIN HFA) 108 (90 Base) MCG/ACT inhaler Inhale 2 puffs into the lungs every 6 (six) hours as needed for wheezing or shortness of breath. 09/19/21   Charlott Holler, MD  amLODipine (NORVASC) 5 MG tablet Take 5 mg by mouth daily.    [provider]  aspirin EC 81 MG tablet Take 81 mg by mouth daily.    [provider]  benzonatate (TESSALON) 200 MG capsule Take 1 capsule (200 mg total) by mouth 3 (three) times daily as needed for cough. 01/15/22   Cobb, Ruby Cola, NP  cetirizine (ZYRTEC) 5 MG tablet Take 5 mg by mouth daily.    [provider]  diclofenac Sodium (VOLTAREN) 1 % GEL Apply 2 g topically every 6 (six) hours as needed (pain).    [provider]  enalapril (VASOTEC)  20 MG tablet Take 20 mg by mouth 2 (two) times daily. Hold for SBP < 110    [provider]  Fluticasone-Umeclidin-Vilant (TRELEGY ELLIPTA) 100-62.5-25 MCG/ACT AEPB Inhale 1 puff into the lungs daily. 12/19/21   Cobb, Ruby Cola, NP  furosemide (LASIX) 20 MG tablet Take 2 tablets (40 mg total) by mouth daily. Pt now taking 40 mg Daily 12/26/22   Charlott Holler, MD  Glycerin-Hypromellose-PEG 400 (ARTIFICIAL TEARS) 0.2-0.2-1 % SOLN Place 1 drop into both eyes 2 (two) times daily.    [provider]  hydrochlorothiazide (HYDRODIURIL) 25 MG tablet Take 25 mg by mouth daily. 09/06/22   [provider]  ibuprofen (ADVIL) 200 MG tablet Take 400 mg by mouth every 8 (eight) hours as needed (pain).    [provider]  Multiple Vitamins-Minerals (ALIVE WOMENS 50+ GUMMY PO) Take 1 Dose by mouth in the morning.    [provider]  pantoprazole (PROTONIX) 40 MG tablet Take 1 tablet (40 mg total) by mouth 2 (two) times daily before a meal. 07/22/22   Charlott Holler, MD  senna (SENOKOT) 8.6 MG TABS tablet Take 1 tablet by mouth 2 (two) times daily.    [provider]  SYNTHROID 175 MCG tablet Take 175 mcg by mouth 2 (two) times daily. 09/30/22  [provider]  vitamin B-12 (CYANOCOBALAMIN) 500 MCG tablet Take 1 tablet (500 mcg total) by mouth daily. 08/26/21   Elgergawy, Leana Roe, MD      Allergies    Duloxetine hcl, Oxybutynin, Penicillins, and Sulfa antibiotics    Review of Systems   Review of Systems  Musculoskeletal:  Positive for arthralgias.  All other systems reviewed and are negative.   Physical Exam Updated Vital Signs BP (!) 100/46   Pulse 67   Temp 98 F (36.7 C)   Resp 17   SpO2 98%   Physical Exam Vitals and nursing note reviewed.  Constitutional:      Appearance: She is well-developed.  HENT:     Head: Normocephalic and atraumatic.     Comments: No visible head trauma Eyes:     Conjunctiva/sclera: Conjunctivae  normal.     Pupils: Pupils are equal, round, and reactive to light.  Cardiovascular:     Rate and Rhythm: Normal rate and regular rhythm.     Heart sounds: Normal heart sounds.  Pulmonary:     Effort: Pulmonary effort is normal. No respiratory distress.     Breath sounds: Rhonchi present.     Comments: Rhonchi on right, NAD, sats 98% On chronic 2L Abdominal:     General: Bowel sounds are normal.     Palpations: Abdomen is soft.  Musculoskeletal:        General: Normal range of motion.     Cervical back: Normal range of motion.     Comments: Left ankle in EMS splint, there is some swelling noted along lateral aspect, no skin tenting/opening, DP pulse intact Some tenderness about the knee as well without acute deformity  Skin:    General: Skin is warm and dry.  Neurological:     Mental Status: She is alert and oriented to person, place, and time.     ED Results / Procedures / Treatments   Labs (all labs ordered are listed, but only abnormal results are displayed) Labs Reviewed  CBC WITH DIFFERENTIAL/PLATELET - Abnormal; Notable for the following components:      Result Value   RBC 3.32 (*)    Hemoglobin 9.7 (*)    HCT 31.7 (*)    All other components within normal limits  BASIC METABOLIC PANEL - Abnormal; Notable for the following components:   Chloride 95 (*)    CO2 38 (*)    Glucose, Bld 126 (*)    BUN 35 (*)    Creatinine, Ser 1.22 (*)    GFR, Estimated 43 (*)    All other components within normal limits  RESP PANEL BY RT-PCR (RSV, FLU A&B, COVID)  RVPGX2  URINALYSIS, ROUTINE W REFLEX MICROSCOPIC    EKG None  Radiology DG Chest 1 View  Result Date: 01/09/2023 CLINICAL DATA:  Fall cough EXAM: CHEST  1 VIEW COMPARISON:  08/23/2021 FINDINGS: Cardiomegaly. Streaky atelectasis at the bases. No consolidation, pleural effusion or pneumothorax. Aortic atherosclerosis IMPRESSION: Cardiomegaly with streaky atelectasis at the bases. Electronically Signed   By: Jasmine Pang M.D.   On: 01/09/2023 23:46   DG Knee Complete 4 Views Left  Result Date: 01/09/2023 CLINICAL DATA:  Fall EXAM: LEFT KNEE - COMPLETE 4+ VIEW COMPARISON:  None Available. FINDINGS: No fracture or malalignment. Mild patellofemoral medial joint space degenerative change. No significant effusion IMPRESSION: No acute osseous abnormality. Electronically Signed   By: Jasmine Pang M.D.   On: 01/09/2023 23:45   DG Ankle Complete Left  Result Date: 01/09/2023 CLINICAL DATA:  Mechanical fall ankle deformity EXAM: LEFT ANKLE COMPLETE - 3+ VIEW COMPARISON:  None Available. FINDINGS: Acute fracture involving distal shaft of fibula with about 1/2 shaft diameter posterior displacement of distal fibular fracture fragment. Acute mildly displaced medial malleolar fracture is well. Mild lateral subluxation of talar dome with respect to the distal tibia. Copious soft tissue swelling IMPRESSION: Acute displaced distal fibular and medial malleolar fractures. Mild lateral subluxation of talar dome with respect to the distal tibia. Electronically Signed   By: Jasmine Pang M.D.   On: 01/09/2023 23:44    Procedures Procedures    Medications Ordered in ED Medications - No data to display  ED Course/ Medical Decision Making/ A&P                                 Medical Decision Making Amount and/or Complexity of Data Reviewed Labs: ordered. Radiology: ordered and independent interpretation performed. ECG/medicine tests: ordered and independent interpretation performed.  Risk Decision regarding hospitalization.   87 year old female presenting to the ED after mechanical fall.  Sounds like her left leg gave out and she fell.  There was no head injury or loss of consciousness.  She does have swelling around the left lateral malleolus, DP pulses intact, moving toes on command.  Also has some tenderness of the knee without acute deformity.  Facility reported cough recently, CXR done but no report/results  given.  No fevers.  Will obtain screening x-rays, labs, covid screen.  Labs as above--no leukocytosis.  Does have chronic anemia.  Has some minor electrolyte derangements, they seem somewhat chronic compared with prior values. SrCr 1.22 today, BUN 35.  Covid screen negative.  X-ray with bimalleolar fracture and subluxed talus.  CXR without acute findings.  RVP negative.  Will discuss with orthopedics.  12:14 AM Daughter now at bedside.  We have discussed imaging results.  Unfortunately, with this type of injury she will need to be NWB.  She currently resides in the assisted living portion of this facility so does not have anyone to assist her throughout the day or provide close monitoring.  Patient is adamant that she does not want to back to rehab facility (apparently was in one previously with negative experience per daughter) but is understands this may be necessary temporarily.  Daughter in agreement with this as well.  Attempted to call [facility] x2 to discuss potential options, no answer from facility.  12:53 AM Spoke with orthopedics, Dr. Magnus Ivan-- does not feel she needs CT, well padded short leg splint.  He is operating at [facility] in the morning so can see in consult.  Agrees likely will need SNF at least in the short term with this injury.  Spoke with hospitalist, Dr. Margo Aye-- will admit for ongoing care, anticipate SNF/rehab placement.  Final Clinical Impression(s) / ED Diagnoses Final diagnoses:  Closed bimalleolar fracture of left ankle, initial encounter  Chronic respiratory failure with hypoxia, on home oxygen therapy [facility])    Rx / DC Orders ED Discharge Orders     None         Garlon Hatchet, PA-C 01/10/23 0206    Bethann Berkshire, MD 01/13/23 1130

## 2023-01-10 ENCOUNTER — Inpatient Hospital Stay (HOSPITAL_COMMUNITY): Payer: Medicare Other

## 2023-01-10 DIAGNOSIS — J449 Chronic obstructive pulmonary disease, unspecified: Secondary | ICD-10-CM | POA: Diagnosis present

## 2023-01-10 DIAGNOSIS — E538 Deficiency of other specified B group vitamins: Secondary | ICD-10-CM | POA: Diagnosis present

## 2023-01-10 DIAGNOSIS — E785 Hyperlipidemia, unspecified: Secondary | ICD-10-CM | POA: Diagnosis present

## 2023-01-10 DIAGNOSIS — G4733 Obstructive sleep apnea (adult) (pediatric): Secondary | ICD-10-CM | POA: Diagnosis present

## 2023-01-10 DIAGNOSIS — K219 Gastro-esophageal reflux disease without esophagitis: Secondary | ICD-10-CM | POA: Diagnosis present

## 2023-01-10 DIAGNOSIS — E039 Hypothyroidism, unspecified: Secondary | ICD-10-CM | POA: Diagnosis present

## 2023-01-10 DIAGNOSIS — Z9981 Dependence on supplemental oxygen: Secondary | ICD-10-CM | POA: Diagnosis not present

## 2023-01-10 DIAGNOSIS — S82892D Other fracture of left lower leg, subsequent encounter for closed fracture with routine healing: Secondary | ICD-10-CM | POA: Diagnosis not present

## 2023-01-10 DIAGNOSIS — N179 Acute kidney failure, unspecified: Secondary | ICD-10-CM | POA: Diagnosis present

## 2023-01-10 DIAGNOSIS — M25562 Pain in left knee: Secondary | ICD-10-CM | POA: Diagnosis present

## 2023-01-10 DIAGNOSIS — N1832 Chronic kidney disease, stage 3b: Secondary | ICD-10-CM | POA: Diagnosis present

## 2023-01-10 DIAGNOSIS — Z66 Do not resuscitate: Secondary | ICD-10-CM | POA: Diagnosis present

## 2023-01-10 DIAGNOSIS — Z1152 Encounter for screening for COVID-19: Secondary | ICD-10-CM | POA: Diagnosis not present

## 2023-01-10 DIAGNOSIS — W19XXXA Unspecified fall, initial encounter: Secondary | ICD-10-CM | POA: Diagnosis present

## 2023-01-10 DIAGNOSIS — M545 Low back pain, unspecified: Secondary | ICD-10-CM | POA: Diagnosis present

## 2023-01-10 DIAGNOSIS — J9811 Atelectasis: Secondary | ICD-10-CM | POA: Diagnosis not present

## 2023-01-10 DIAGNOSIS — S83012A Lateral subluxation of left patella, initial encounter: Secondary | ICD-10-CM | POA: Diagnosis present

## 2023-01-10 DIAGNOSIS — Z87891 Personal history of nicotine dependence: Secondary | ICD-10-CM | POA: Diagnosis not present

## 2023-01-10 DIAGNOSIS — Z7982 Long term (current) use of aspirin: Secondary | ICD-10-CM | POA: Diagnosis not present

## 2023-01-10 DIAGNOSIS — R059 Cough, unspecified: Secondary | ICD-10-CM | POA: Diagnosis present

## 2023-01-10 DIAGNOSIS — S82892A Other fracture of left lower leg, initial encounter for closed fracture: Secondary | ICD-10-CM | POA: Diagnosis present

## 2023-01-10 DIAGNOSIS — I5032 Chronic diastolic (congestive) heart failure: Secondary | ICD-10-CM | POA: Diagnosis present

## 2023-01-10 DIAGNOSIS — Z79899 Other long term (current) drug therapy: Secondary | ICD-10-CM | POA: Diagnosis not present

## 2023-01-10 DIAGNOSIS — I130 Hypertensive heart and chronic kidney disease with heart failure and stage 1 through stage 4 chronic kidney disease, or unspecified chronic kidney disease: Secondary | ICD-10-CM | POA: Diagnosis present

## 2023-01-10 DIAGNOSIS — J9611 Chronic respiratory failure with hypoxia: Secondary | ICD-10-CM | POA: Diagnosis present

## 2023-01-10 DIAGNOSIS — S82852A Displaced trimalleolar fracture of left lower leg, initial encounter for closed fracture: Secondary | ICD-10-CM | POA: Diagnosis present

## 2023-01-10 DIAGNOSIS — Z79890 Hormone replacement therapy (postmenopausal): Secondary | ICD-10-CM | POA: Diagnosis not present

## 2023-01-10 LAB — RESP PANEL BY RT-PCR (RSV, FLU A&B, COVID)  RVPGX2
Influenza A by PCR: NEGATIVE
Influenza B by PCR: NEGATIVE
Resp Syncytial Virus by PCR: NEGATIVE
SARS Coronavirus 2 by RT PCR: NEGATIVE

## 2023-01-10 LAB — CBC
HCT: 30.2 % — ABNORMAL LOW (ref 36.0–46.0)
Hemoglobin: 9.1 g/dL — ABNORMAL LOW (ref 12.0–15.0)
MCH: 29.1 pg (ref 26.0–34.0)
MCHC: 30.1 g/dL (ref 30.0–36.0)
MCV: 96.5 fL (ref 80.0–100.0)
Platelets: 201 10*3/uL (ref 150–400)
RBC: 3.13 MIL/uL — ABNORMAL LOW (ref 3.87–5.11)
RDW: 15.6 % — ABNORMAL HIGH (ref 11.5–15.5)
WBC: 8.2 10*3/uL (ref 4.0–10.5)
nRBC: 0.2 % (ref 0.0–0.2)

## 2023-01-10 LAB — URINALYSIS, ROUTINE W REFLEX MICROSCOPIC
Bacteria, UA: NONE SEEN
Bilirubin Urine: NEGATIVE
Glucose, UA: NEGATIVE mg/dL
Hgb urine dipstick: NEGATIVE
Ketones, ur: NEGATIVE mg/dL
Nitrite: NEGATIVE
Protein, ur: NEGATIVE mg/dL
Specific Gravity, Urine: 1.02 (ref 1.005–1.030)
pH: 5 (ref 5.0–8.0)

## 2023-01-10 LAB — CBC WITH DIFFERENTIAL/PLATELET
Abs Immature Granulocytes: 0.06 10*3/uL (ref 0.00–0.07)
Basophils Absolute: 0 10*3/uL (ref 0.0–0.1)
Basophils Relative: 0 %
Eosinophils Absolute: 0.2 10*3/uL (ref 0.0–0.5)
Eosinophils Relative: 2 %
HCT: 31.7 % — ABNORMAL LOW (ref 36.0–46.0)
Hemoglobin: 9.7 g/dL — ABNORMAL LOW (ref 12.0–15.0)
Immature Granulocytes: 1 %
Lymphocytes Relative: 11 %
Lymphs Abs: 1 10*3/uL (ref 0.7–4.0)
MCH: 29.2 pg (ref 26.0–34.0)
MCHC: 30.6 g/dL (ref 30.0–36.0)
MCV: 95.5 fL (ref 80.0–100.0)
Monocytes Absolute: 0.8 10*3/uL (ref 0.1–1.0)
Monocytes Relative: 9 %
Neutro Abs: 7.2 10*3/uL (ref 1.7–7.7)
Neutrophils Relative %: 77 %
Platelets: 202 10*3/uL (ref 150–400)
RBC: 3.32 MIL/uL — ABNORMAL LOW (ref 3.87–5.11)
RDW: 15.4 % (ref 11.5–15.5)
WBC: 9.2 10*3/uL (ref 4.0–10.5)
nRBC: 0.2 % (ref 0.0–0.2)

## 2023-01-10 LAB — BASIC METABOLIC PANEL WITH GFR
Anion gap: 13 (ref 5–15)
BUN: 34 mg/dL — ABNORMAL HIGH (ref 8–23)
CO2: 34 mmol/L — ABNORMAL HIGH (ref 22–32)
Calcium: 8.9 mg/dL (ref 8.9–10.3)
Chloride: 93 mmol/L — ABNORMAL LOW (ref 98–111)
Creatinine, Ser: 1.2 mg/dL — ABNORMAL HIGH (ref 0.44–1.00)
GFR, Estimated: 44 mL/min — ABNORMAL LOW
Glucose, Bld: 107 mg/dL — ABNORMAL HIGH (ref 70–99)
Potassium: 3.2 mmol/L — ABNORMAL LOW (ref 3.5–5.1)
Sodium: 140 mmol/L (ref 135–145)

## 2023-01-10 LAB — BASIC METABOLIC PANEL
Anion gap: 10 (ref 5–15)
BUN: 35 mg/dL — ABNORMAL HIGH (ref 8–23)
CO2: 38 mmol/L — ABNORMAL HIGH (ref 22–32)
Calcium: 9.1 mg/dL (ref 8.9–10.3)
Chloride: 95 mmol/L — ABNORMAL LOW (ref 98–111)
Creatinine, Ser: 1.22 mg/dL — ABNORMAL HIGH (ref 0.44–1.00)
GFR, Estimated: 43 mL/min — ABNORMAL LOW (ref >60)
Glucose, Bld: 126 mg/dL — ABNORMAL HIGH (ref 70–99)
Potassium: 3.5 mmol/L (ref 3.5–5.1)
Sodium: 143 mmol/L (ref 135–145)

## 2023-01-10 LAB — PHOSPHORUS: Phosphorus: 3.8 mg/dL (ref 2.5–4.6)

## 2023-01-10 LAB — MAGNESIUM: Magnesium: 2.1 mg/dL (ref 1.7–2.4)

## 2023-01-10 MED ORDER — SENNA 8.6 MG PO TABS
1.0000 | ORAL_TABLET | Freq: Two times a day (BID) | ORAL | Status: DC
  Administered 2023-01-10 – 2023-01-14 (×9): 8.6 mg via ORAL
  Filled 2023-01-10 (×9): qty 1

## 2023-01-10 MED ORDER — HYDROMORPHONE HCL 1 MG/ML IJ SOLN
0.5000 mg | INTRAMUSCULAR | Status: DC | PRN
  Administered 2023-01-10: 0.5 mg via INTRAVENOUS
  Filled 2023-01-10: qty 0.5

## 2023-01-10 MED ORDER — MELATONIN 5 MG PO TABS
5.0000 mg | ORAL_TABLET | Freq: Every evening | ORAL | Status: DC | PRN
  Filled 2023-01-10 (×2): qty 1

## 2023-01-10 MED ORDER — HYDROCHLOROTHIAZIDE 25 MG PO TABS
25.0000 mg | ORAL_TABLET | Freq: Every day | ORAL | Status: DC
  Administered 2023-01-10: 25 mg via ORAL
  Filled 2023-01-10: qty 1

## 2023-01-10 MED ORDER — PANTOPRAZOLE SODIUM 40 MG PO TBEC
40.0000 mg | DELAYED_RELEASE_TABLET | Freq: Two times a day (BID) | ORAL | Status: DC
  Administered 2023-01-10 – 2023-01-14 (×10): 40 mg via ORAL
  Filled 2023-01-10 (×10): qty 1

## 2023-01-10 MED ORDER — ENOXAPARIN SODIUM 40 MG/0.4ML IJ SOSY
40.0000 mg | PREFILLED_SYRINGE | INTRAMUSCULAR | Status: DC
  Administered 2023-01-10 – 2023-01-14 (×5): 40 mg via SUBCUTANEOUS
  Filled 2023-01-10 (×5): qty 0.4

## 2023-01-10 MED ORDER — LEVOTHYROXINE SODIUM 25 MCG PO TABS
175.0000 ug | ORAL_TABLET | Freq: Two times a day (BID) | ORAL | Status: DC
  Administered 2023-01-10 – 2023-01-14 (×9): 175 ug via ORAL
  Filled 2023-01-10: qty 2
  Filled 2023-01-10 (×2): qty 1
  Filled 2023-01-10: qty 2
  Filled 2023-01-10 (×2): qty 1
  Filled 2023-01-10 (×3): qty 2
  Filled 2023-01-10: qty 1
  Filled 2023-01-10: qty 2
  Filled 2023-01-10 (×2): qty 1
  Filled 2023-01-10 (×3): qty 2
  Filled 2023-01-10 (×2): qty 1

## 2023-01-10 MED ORDER — VITAMIN B-12 1000 MCG PO TABS
500.0000 ug | ORAL_TABLET | Freq: Every day | ORAL | Status: DC
  Administered 2023-01-10 – 2023-01-14 (×5): 500 ug via ORAL
  Filled 2023-01-10 (×5): qty 1

## 2023-01-10 MED ORDER — ALBUTEROL SULFATE (2.5 MG/3ML) 0.083% IN NEBU: 2.5000 mg | INHALATION_SOLUTION | RESPIRATORY_TRACT | Status: DC | PRN

## 2023-01-10 MED ORDER — UMECLIDINIUM BROMIDE 62.5 MCG/ACT IN AEPB
1.0000 | INHALATION_SPRAY | Freq: Every day | RESPIRATORY_TRACT | Status: DC
  Administered 2023-01-10 – 2023-01-14 (×5): 1 via RESPIRATORY_TRACT
  Filled 2023-01-10: qty 7

## 2023-01-10 MED ORDER — ACETAMINOPHEN 325 MG PO TABS
650.0000 mg | ORAL_TABLET | Freq: Four times a day (QID) | ORAL | Status: DC | PRN
  Administered 2023-01-10 – 2023-01-12 (×3): 650 mg via ORAL
  Filled 2023-01-10 (×3): qty 2

## 2023-01-10 MED ORDER — POLYETHYLENE GLYCOL 3350 17 G PO PACK
17.0000 g | PACK | Freq: Every day | ORAL | Status: DC | PRN
  Administered 2023-01-14: 17 g via ORAL
  Filled 2023-01-10: qty 1

## 2023-01-10 MED ORDER — SODIUM CHLORIDE 0.9% FLUSH
10.0000 mL | Freq: Two times a day (BID) | INTRAVENOUS | Status: DC
  Administered 2023-01-10 – 2023-01-11 (×3): 10 mL via INTRAVENOUS

## 2023-01-10 MED ORDER — LACTATED RINGERS IV SOLN: INTRAVENOUS | Status: DC

## 2023-01-10 MED ORDER — POTASSIUM CHLORIDE 10 MEQ/100ML IV SOLN
10.0000 meq | INTRAVENOUS | Status: AC
  Administered 2023-01-10 (×3): 10 meq via INTRAVENOUS
  Filled 2023-01-10 (×2): qty 100

## 2023-01-10 MED ORDER — PROCHLORPERAZINE EDISYLATE 10 MG/2ML IJ SOLN: 5.0000 mg | Freq: Four times a day (QID) | INTRAMUSCULAR | Status: DC | PRN

## 2023-01-10 MED ORDER — OXYCODONE HCL 5 MG PO TABS
5.0000 mg | ORAL_TABLET | Freq: Four times a day (QID) | ORAL | Status: DC | PRN
  Administered 2023-01-10 – 2023-01-11 (×2): 5 mg via ORAL
  Filled 2023-01-10 (×4): qty 1

## 2023-01-10 MED ORDER — ASPIRIN 81 MG PO TBEC
81.0000 mg | DELAYED_RELEASE_TABLET | Freq: Every day | ORAL | Status: DC
  Administered 2023-01-10 – 2023-01-14 (×5): 81 mg via ORAL
  Filled 2023-01-10 (×5): qty 1

## 2023-01-10 MED ORDER — ALBUTEROL SULFATE (2.5 MG/3ML) 0.083% IN NEBU
2.5000 mg | INHALATION_SOLUTION | Freq: Two times a day (BID) | RESPIRATORY_TRACT | Status: DC
  Administered 2023-01-10 – 2023-01-14 (×8): 2.5 mg via RESPIRATORY_TRACT
  Filled 2023-01-10 (×8): qty 3

## 2023-01-10 MED ORDER — FLUTICASONE FUROATE-VILANTEROL 100-25 MCG/ACT IN AEPB
1.0000 | INHALATION_SPRAY | Freq: Every day | RESPIRATORY_TRACT | Status: DC
  Administered 2023-01-10 – 2023-01-14 (×5): 1 via RESPIRATORY_TRACT
  Filled 2023-01-10: qty 28

## 2023-01-10 NOTE — Progress Notes (Signed)
Patient ID: Valerie Bowman, female   DOB: 1935/09/30, 87 y.o.   MRN: [id number] The patient is an 87 year old female who is someone who resides in assisted living facility.  She unfortunately had a mechanical fall at that facility late yesterday and was found to have a deformity of her left ankle.  This was a closed deformity.  She was then transported to the emergency room and found to have a complex left ankle fracture.  I was able to review the x-rays.  It looks like it is more of a trimalleolar fracture.  There was some slight malalignment of the ankle mortise.  Her skin is intact.  I did correspond with the ED providers.  They were able to keep the ankle in acceptable alignment with a well-padded splint.  She was graciously admitted to the medicine service due to significant comorbidities and due to the fact that she will need skilled nursing as she recovers from this injury given the fact that she will be nonweightbearing on that left ankle for some time now.  Thus far, I would not recommend urgent surgery on the ankle due to significant soft tissue swelling.  This is something that should be stabilized at a later date once the soft tissue swelling decreases.  She can be up with physical therapy with strict nonweightbearing on her left ankle.  She will be followed by orthopedics while she is in the hospital and then we can make decisions later as to the potential for surgery at a later date if indicated.  I would still like to order a CT scan of her left ankle today to better delineate the fracture.

## 2023-01-10 NOTE — [place] FL2 (Signed)
[place] MEDICAID FL2 LEVEL OF CARE FORM     IDENTIFICATION  Patient Name: Valerie Bowman Birthdate: 1935/06/20 Sex: female Admission Date (Current Location): 01/09/2023  [facility] and [place] Number:  Producer, television/film/video and Address:  [facility],  [address], [place] [address]      Provider Number: [id number]  Attending Physician Name and Address:  Noralee Stain, DO  Relative Name and Phone Number:  Buel Ream (daughter) Ph: [phone]    Current Level of Care: Hospital Recommended Level of Care: Skilled Nursing Facility Prior Approval Number:    Date Approved/Denied:   PASRR Number: [id number]  Discharge Plan: SNF    Current Diagnoses: Patient Active Problem List   Diagnosis Date Noted   Closed left ankle fracture 01/10/2023   Chronic cough 01/15/2022   OSA (obstructive sleep apnea) 12/19/2021   Chronic respiratory failure with hypoxia and hypercapnia (HCC) 08/23/2021   Acute metabolic encephalopathy 08/23/2021   Pain in right shoulder 03/20/2021   SOB (shortness of breath) 02/28/2021   Acquired hypothyroidism    Essential hypertension    Chronic diastolic heart failure (HCC)    COPD, severe (HCC) 02/27/2021   Low back pain 02/20/2021    Orientation RESPIRATION BLADDER Height & Weight     Self, Time, Situation, Place  O2 (2L/min) Incontinent Weight: 258 lb 6.1 oz (117.2 kg) Height:  5\' 6"  (167.6 cm)  BEHAVIORAL SYMPTOMS/MOOD NEUROLOGICAL BOWEL NUTRITION STATUS      Continent Diet (Heart healthy diet)  AMBULATORY STATUS COMMUNICATION OF NEEDS Skin   Extensive Assist Verbally Normal                       Personal Care Assistance Level of Assistance  Bathing, Feeding, Dressing Bathing Assistance: Maximum assistance Feeding assistance: Independent Dressing Assistance: Maximum assistance     Functional Limitations Info  Sight, Hearing, Speech Sight Info: Impaired Hearing Info: Impaired Speech Info: Adequate    SPECIAL  CARE FACTORS FREQUENCY  PT (By licensed PT), OT (By licensed OT)     PT Frequency: 5x's/week OT Frequency: 5x's/week            Contractures Contractures Info: Not present    Additional Factors Info  Code Status, Allergies Code Status Info: DNR Allergies Info: Duloxetine Hcl, Oxybutynin, Penicillins, Sulfa Antibiotics           Current Medications (01/10/2023):  This is the current hospital active medication list Current Facility-Administered Medications  Medication Dose Route Frequency Provider Last Rate Last Admin   acetaminophen (TYLENOL) tablet 650 mg  650 mg Oral Q6H PRN Dow Adolph N, DO   650 mg at 01/10/23 0511   albuterol (PROVENTIL) (2.5 MG/3ML) 0.083% nebulizer solution 2.5 mg  2.5 mg Nebulization PRN Dow Adolph N, DO       albuterol (PROVENTIL) (2.5 MG/3ML) 0.083% nebulizer solution 2.5 mg  2.5 mg Nebulization BID Noralee Stain, DO       aspirin EC tablet 81 mg  81 mg Oral Daily Darlin Drop, DO   81 mg at 01/10/23 8469   cyanocobalamin (VITAMIN B12) tablet 500 mcg  500 mcg Oral Daily Dow Adolph N, DO   500 mcg at 01/10/23 0948   enoxaparin (LOVENOX) injection 40 mg  40 mg Subcutaneous Q24H Hall, Carole N, DO   40 mg at 01/10/23 0948   fluticasone furoate-vilanterol (BREO ELLIPTA) 100-25 MCG/ACT 1 puff  1 puff Inhalation Daily Dow Adolph N, DO   1 puff at 01/10/23 [phone]  And   umeclidinium bromide (INCRUSE ELLIPTA) 62.5 MCG/ACT 1 puff  1 puff Inhalation Daily Dow Adolph N, DO   1 puff at 01/10/23 4098   hydrochlorothiazide (HYDRODIURIL) tablet 25 mg  25 mg Oral Daily Noralee Stain, DO   25 mg at 01/10/23 1425   levothyroxine (SYNTHROID) tablet 175 mcg  175 mcg Oral BID Darlin Drop, DO   175 mcg at 01/10/23 1191   melatonin tablet 5 mg  5 mg Oral QHS PRN Dow Adolph N, DO       oxyCODONE (Oxy IR/ROXICODONE) immediate release tablet 5 mg  5 mg Oral Q6H PRN Dow Adolph N, DO   5 mg at 01/10/23 0336   pantoprazole (PROTONIX) EC tablet 40 mg  40 mg Oral  BID AC Hall, Carole N, DO   40 mg at 01/10/23 0948   polyethylene glycol (MIRALAX / GLYCOLAX) packet 17 g  17 g Oral Daily PRN Dow Adolph N, DO       prochlorperazine (COMPAZINE) injection 5 mg  5 mg Intravenous Q6H PRN Hall, Carole N, DO       senna (SENOKOT) tablet 8.6 mg  1 tablet Oral BID Noralee Stain, DO   8.6 mg at 01/10/23 1425   sodium chloride flush (NS) 0.9 % injection 10 mL  10 mL Intravenous Q12H Noralee Stain, DO         Discharge Medications: Please see discharge summary for a list of discharge medications.  Relevant Imaging Results:  Relevant Lab Results:   Additional Information SSN: [id number]  Ewing Schlein, LCSW

## 2023-01-10 NOTE — Evaluation (Addendum)
Physical Therapy Evaluation Patient Details Name: Valerie Bowman MRN: [id number] DOB: [birth year] Today's Date: 01/10/2023  History of Present Illness  87 yo female presents to therapy following hospital admission on 01/09/2023 due to mechanical fall at [facility]. Fall resulted in L ankle trimalleolar fx, orthopedics recommends conservative measures at this time due to extensive edema. Pt is L LE NWB. Pt PMH includes but is not limited to: falls, hypothyroidism, COPD, HTN, chronic HFpEF and GERD.  Clinical Impression      Pt admitted with above diagnosis.  Pt currently with functional limitations due to the deficits listed below (see PT Problem List). Pt in bed when PT arrived. Daughter present. Pt required some encouragement for therapy and appears to have flat affect. Daughter provided insight to PLOF at [facility]. Pt stated that her knees just gave out and she fell. Daughter indicated A x 2 from staff with RW transferring when pt fell. Pt exhibited intermittent rigor like involuntary mm movements, pt reports that it is not new and attributes to peripheral neuropathy, daughter states she has never seen her body do that before. Pt indicated no pain at rest, soft cast distal L LE due to edema. Pt required max A to roll side to side, cues for head turn and use of B UE at bed rail to assist therapist and rehab tech with rolling tasks, max A x 2 for supine to sit EOB, pt required cues for LLE NWB once in sitting pt required mod A initially due to strong R lateral lean and progressed to min A for balance unsupported EOB, pt was unable to participate with lateral scooting seated EOB and required max A x 2 for sit to supine with minimal initiation of movement and total A to reposition in bed. Pt left in bed and all needs in place. Patient will benefit from continued inpatient follow up therapy, <3 hours/day. Pt will benefit from acute skilled PT to increase their independence and safety with mobility to  allow discharge.       If plan is discharge home, recommend the following: Two people to help with walking and/or transfers;Two people to help with bathing/dressing/bathroom;Assistance with cooking/housework;Assistance with feeding;Assist for transportation;Direct supervision/assist for financial management;Direct supervision/assist for medications management;Help with stairs or ramp for entrance;Supervision due to cognitive status   Can travel by private vehicle        Equipment Recommendations None recommended by PT  Recommendations for Other Services       Functional Status Assessment Patient has had a recent decline in their functional status and demonstrates the ability to make significant improvements in function in a reasonable and predictable amount of time.     Precautions / Restrictions Precautions Precautions: Fall Required Braces or Orthoses: Splint/Cast Restrictions Weight Bearing Restrictions: Yes LLE Weight Bearing: Non weight bearing      Mobility  Bed Mobility Overal bed mobility: Needs Assistance Bed Mobility: Rolling, Sidelying to Sit, Supine to Sit Rolling: Max assist, Used rails Sidelying to sit: Max assist, +2 for physical assistance, +2 for safety/equipment, HOB elevated, Used rails Supine to sit: Max assist, +2 for physical assistance, +2 for safety/equipment, Used rails     General bed mobility comments: pt demonstrated minimal initiation of movement with strong cues for participation. pt indicated that it just was not worth all the hard work to start over again, pt required max A x 2 for lateral scooting EOB and cues for L LE NWB with PTs foot placed under pts for immediate  feedback and cues    Transfers                   General transfer comment: NT recommend total lift if pt is required to get OOB    Ambulation/Gait               General Gait Details: NT  Stairs            Wheelchair Mobility     Tilt Bed     Modified Rankin (Stroke Patients Only)       Balance Overall balance assessment: Needs assistance, History of Falls Sitting-balance support: Feet supported, Bilateral upper extremity supported Sitting balance-Leahy Scale: Poor Sitting balance - Comments: pt required strong cues for attention to midline with a R lateral lean, pt self limiting L LE WB in sitting and required mod A for initial sitting balance and able to progress to min A with ongoing cues for attention to midline and increased IND with unsupported sitting                                     Pertinent Vitals/Pain Pain Assessment Pain Assessment: Faces Faces Pain Scale: Hurts even more (pt denies pain at rest) Pain Location: L LE Pain Descriptors / Indicators: Aching, Discomfort, Guarding Pain Intervention(s): Limited activity within patient's tolerance, Monitored during session, Repositioned    Home Living Family/patient expects to be discharged to:: Assisted living                 Home Equipment: Agricultural consultant (2 wheels);Wheelchair - manual;Lift chair Additional Comments: [facility]. pt required A for all functional mobility tasks, primarily used wc, however dependent for mobility and slept in a lift chair    Prior Function Prior Level of Function : Needs assist;History of Falls (last six months)             Mobility Comments: pt required A for all functional mobility tasks primarily at wc level ADLs Comments: staff assist wtih medication management, ADLs and meals     Extremity/Trunk Assessment        Lower Extremity Assessment Lower Extremity Assessment: Generalized weakness (L LE NT)    Cervical / Trunk Assessment Cervical / Trunk Assessment: Kyphotic  Communication   Communication Communication: Difficulty following commands/understanding Following commands: Follows one step commands inconsistently Cueing Techniques: Verbal cues;Gestural cues;Tactile  cues;Visual cues  Cognition Arousal: Suspect due to medications Behavior During Therapy: Flat affect Overall Cognitive Status: Impaired/Different from baseline Area of Impairment: Attention, Awareness, Safety/judgement                         Safety/Judgement: Decreased awareness of safety, Decreased awareness of deficits     General Comments: pt flat and required cues for attention, to maintain eyes open and to engage with therapy, daughter present        General Comments General comments (skin integrity, edema, etc.): pt on 2 L/min supplemental O2 at baseline per pt and daughter report pt O2 saturation during PT eval 90-91% and cues for pursed lip breathing provided    Exercises     Assessment/Plan    PT Assessment Patient needs continued PT services  PT Problem List Decreased strength;Decreased range of motion;Decreased activity tolerance;Decreased balance;Decreased mobility;Decreased coordination;Decreased cognition;Decreased safety awareness;Decreased knowledge of precautions;Cardiopulmonary status limiting activity;Pain       PT Treatment Interventions DME instruction;Functional  mobility training;Therapeutic activities;Therapeutic exercise;Balance training;Neuromuscular re-education;Cognitive remediation;Patient/family education;Modalities    PT Goals (Current goals can be found in the Care Plan section)  Acute Rehab PT Goals Patient Stated Goal: to go back to ALF and not do rehab PT Goal Formulation: With patient Time For Goal Achievement: 01/24/23 Potential to Achieve Goals: Fair    Frequency Min 1X/week     Co-evaluation               AM-PAC PT "6 Clicks" Mobility  Outcome Measure Help needed turning from your back to your side while in a flat bed without using bedrails?: A Lot Help needed moving from lying on your back to sitting on the side of a flat bed without using bedrails?: Total Help needed moving to and from a bed to a chair (including a  wheelchair)?: Total Help needed standing up from a chair using your arms (e.g., wheelchair or bedside chair)?: Total Help needed to walk in hospital room?: Total Help needed climbing 3-5 steps with a railing? : Total 6 Click Score: 7    End of Session Equipment Utilized During Treatment: Oxygen Activity Tolerance: Patient limited by lethargy;Patient limited by pain Patient left: in bed;with call bell/phone within reach;with bed alarm set;with family/visitor present Nurse Communication: Mobility status;Need for lift equipment;Weight bearing status PT Visit Diagnosis: Unsteadiness on feet (R26.81);Other abnormalities of gait and mobility (R26.89);Muscle weakness (generalized) (M62.81);History of falling (Z91.81);Difficulty in walking, not elsewhere classified (R26.2);Pain Pain - Right/Left: Left Pain - part of body: Leg;Ankle and joints of foot    Time: 5621-3086 PT Time Calculation (min) (ACUTE ONLY): 26 min   Charges:   PT Evaluation $PT Eval Low Complexity: 1 Low PT Treatments $Therapeutic Activity: 8-22 mins PT General Charges $$ ACUTE PT VISIT: 1 Visit         Johnny Bridge, PT Acute Rehab   Jacqualyn Posey 01/10/2023, 12:48 PM

## 2023-01-10 NOTE — Progress Notes (Signed)
  PROGRESS NOTE  Patient admitted earlier this morning. See H&P.   Valerie Bowman is a 87 y.o. female with medical history significant for COPD, chronic diastolic CHF, hypertension, GERD, hypothyroidism, who presents to the ED from assisted living facility after a mechanical fall.  Reportedly her legs gave out and she fell onto her left side twisting her left ankle.  Denies any loss of consciousness or head injury.  EMS was activated and she was brought into the ED for further evaluation.  Left ankle x-ray showing acute displaced distal fibula and medial malleoli fractures. Mild lateral subluxation of patella dome with respect to the distal tibia. Orthopedic surgery consulted by EDP.   Patient working with PT on my evaluation.  Orthopedic surgery recommended further evaluation with CT, did not recommend urgent surgery due to significant soft tissue swelling.  Daughter is at bedside who requested updates via phone.  -PT OT -Suspect she will need SNF placement.  TOC consult -Orthopedic surgery following, CT scan results pending -Replace potassium today   Status is: Inpatient Remains inpatient appropriate because: Pending further plan from surgery team, TOC for SNF placement   Noralee Stain, DO [facility] 01/10/2023, 1:13 PM  Available via Epic secure chat 7am-7pm After these hours, please refer to coverage provider listed on [email or web address]

## 2023-01-10 NOTE — ED Notes (Signed)
Ortho tech called for application of ordered splint short leg.

## 2023-01-10 NOTE — TOC Progression Note (Signed)
Transition of Care [facility]) - Progression Note   Patient Details  Name: Valerie Bowman MRN: [id number] Date of Birth: 02-Feb-1936  [facility]) CM/SW Contact  Ewing Schlein, LCSW Phone Number: 01/10/2023, 3:25 PM  Clinical Narrative: PT evaluation recommended SNF. CSW spoke with daughter and she is agreeable to short-term rehab at discharge. FL2 done; PASRR received. Initial referral faxed out for review. TOC awaiting bed offers.    Expected Discharge Plan: Skilled Nursing Facility Barriers to Discharge: Continued Medical Work up, SNF Pending bed offer  Expected Discharge Plan and Services In-house Referral: Clinical Social Work Post Acute Care Choice: Skilled Nursing Facility Living arrangements for the past 2 months: Assisted Living Facility  Social Determinants of Health (SDOH) Interventions SDOH Screenings   Food Insecurity: No Food Insecurity (01/10/2023)  Housing: Low Risk  (01/10/2023)  Transportation Needs: No Transportation Needs (01/10/2023)  Utilities: Not At Risk (01/10/2023)  Financial Resource Strain: Low Risk  (09/10/2020)   Received from [facility]  Physical Activity: Inactive (09/10/2020)   Received from [facility]  Social Connections: Moderately Isolated (09/10/2020)   Received from [facility]  Stress: No Stress Concern Present (09/10/2020)   Received from [facility]  Tobacco Use: Medium Risk (01/09/2023)   Readmission Risk Interventions     No data to display

## 2023-01-10 NOTE — TOC Initial Note (Signed)
Transition of Care [facility]) - Initial/Assessment Note   Patient Details  Name: Valerie Bowman MRN: [id number] Date of Birth: Oct 28, 1935  Transition of Care [facility]) CM/SW Contact:    Ewing Schlein, LCSW Phone Number: 01/10/2023, 10:00 AM  Clinical Narrative: Patient resides at [facility]. TOC consulted for possible SNF placement. Awaiting PT evaluation.                 Expected Discharge Plan: Skilled Nursing Facility Barriers to Discharge: Continued Medical Work up  Expected Discharge Plan and Services In-house Referral: Clinical Social Work Post Acute Care Choice: Skilled Nursing Facility Living arrangements for the past 2 months: Assisted Living Facility  Prior Living Arrangements/Services Living arrangements for the past 2 months: Assisted Living Facility Lives with:: Facility Resident Patient language and need for interpreter reviewed:: Yes Need for Family Participation in Patient Care: Yes (Comment) Care giver support system in place?: Yes (comment) Criminal Activity/Legal Involvement Pertinent to Current Situation/Hospitalization: No - Comment as needed  Activities of Daily Living ADL Screening (condition at time of admission) Independently performs ADLs?: No Does the patient have a NEW difficulty with bathing/dressing/toileting/self-feeding that is expected to last >3 days?: Yes (Initiates electronic notice to provider for possible OT consult) Does the patient have a NEW difficulty with getting in/out of bed, walking, or climbing stairs that is expected to last >3 days?: Yes (Initiates electronic notice to provider for possible PT consult) Does the patient have a NEW difficulty with communication that is expected to last >3 days?: Yes (Initiates electronic notice to provider for possible SLP consult) Is the patient deaf or have difficulty hearing?: Yes Does the patient have difficulty seeing, even when wearing glasses/contacts?: No Does the patient have difficulty  concentrating, remembering, or making decisions?: No  Emotional Assessment Orientation: : Oriented to Self, Oriented to Place, Oriented to  Time, Oriented to Situation Alcohol / Substance Use: Not Applicable Psych Involvement: No (comment)  Admission diagnosis:  Closed left ankle fracture [S82.892A] Closed bimalleolar fracture of left ankle, initial encounter [S82.842A] Chronic respiratory failure with hypoxia, on home oxygen therapy (HCC) [J96.11, Z99.81] Patient Active Problem List   Diagnosis Date Noted   Closed left ankle fracture 01/10/2023   Chronic cough 01/15/2022   OSA (obstructive sleep apnea) 12/19/2021   Chronic respiratory failure with hypoxia and hypercapnia (HCC) 08/23/2021   Acute metabolic encephalopathy 08/23/2021   Pain in right shoulder 03/20/2021   SOB (shortness of breath) 02/28/2021   Acquired hypothyroidism    Essential hypertension    Chronic diastolic heart failure (HCC)    COPD, severe (HCC) 02/27/2021   Low back pain 02/20/2021   PCP:  Smiley Houseman, NP Pharmacy:   [facility] [place], [place] - [address] [place] [address] Phone: [phone] Fax: [phone]  Social Determinants of Health (SDOH) Social History: SDOH Screenings   Food Insecurity: No Food Insecurity (01/10/2023)  Housing: Low Risk  (01/10/2023)  Transportation Needs: No Transportation Needs (01/10/2023)  Utilities: Not At Risk (01/10/2023)  Financial Resource Strain: Low Risk  (09/10/2020)   Received from [facility]  Physical Activity: Inactive (09/10/2020)   Received from [facility]  Social Connections: Moderately Isolated (09/10/2020)   Received from [facility]  Stress: No Stress Concern Present (09/10/2020)   Received from [facility]  Tobacco Use: Medium Risk (01/09/2023)   SDOH Interventions:    Readmission Risk Interventions     No data to display

## 2023-01-10 NOTE — Progress Notes (Signed)
   01/10/23 1456  BiPAP/CPAP/SIPAP  BiPAP/CPAP/SIPAP Pt Type Adult  BiPAP/CPAP/SIPAP Resmed  Mask Type Nasal pillows  Respiratory Rate 35 breaths/min  EPAP  (max 26 min 5)  FiO2 (%)  (2l bled in)  Tidal Volume (Vt) 300  BiPAP/CPAP /SiPAP Vitals  Pulse Rate 65  SpO2 94 %  Bilateral Breath Sounds Clear;Diminished  MEWS Score/Color  MEWS Score 0  MEWS Score Color Valerie Bowman

## 2023-01-10 NOTE — Plan of Care (Signed)

## 2023-01-10 NOTE — Progress Notes (Deleted)
Min 5 max 26

## 2023-01-10 NOTE — Progress Notes (Signed)
Spoke with pts daughter regarding pt wearing home Trilogy bipap. She is aware that the therapist will turn the machine on and connect the o2. No other adjustments are allowed to be made by RT. If patients has a change in health status the machine would be removed. The pt would then be moved and be placed on another type of machine.

## 2023-01-10 NOTE — Plan of Care (Signed)

## 2023-01-10 NOTE — Progress Notes (Signed)
   01/10/23 2126  BiPAP/CPAP/SIPAP  BiPAP/CPAP/SIPAP Pt Type Adult  BiPAP/CPAP/SIPAP  (Trilogy)  Mask Type Nasal pillows  Flow Rate 2 lpm  Patient Home Equipment Yes  Safety Check Completed by RT for Home Unit Yes, no issues noted  BiPAP/CPAP /SiPAP Vitals  Pulse Rate (!) 58  Resp 17  SpO2 97 %  Bilateral Breath Sounds Clear;Diminished  MEWS Score/Color  MEWS Score 0  MEWS Score Color Green   Pt placed on her home machine trilogy with 2 L O2. No complications at this time.

## 2023-01-10 NOTE — ED Notes (Signed)
ED TO INPATIENT HANDOFF REPORT  ED Nurse Name and Phone #: [place] Baldwin/[phone]  S Name/Age/Gender Valerie Bowman 87 y.o. female Room/Bed: WA18/WA18  Code Status   Code Status: Limited: Do not attempt resuscitation (DNR) -DNR-LIMITED -Do Not Intubate/DNI   Home/SNF/Other Assisted living Patient oriented to: self, place, time, and situation Is this baseline? Yes   Triage Complete: Triage complete  Chief Complaint Closed left ankle fracture [J19.147W]  Triage Note Pt BIB EMS from [facility] for a mechanical fall. Pt presents with a left ankle deformity but pedal pulses are strong according to EMS. No LOC and pt did not hit her head.   No blood thinners  A&Ox4  Side Note: The facility stated she's been having a cough lately and per EMS she has ronchi on lower bilateral lungs   Allergies Allergies  Allergen Reactions   Duloxetine Hcl Other (See Comments)   Oxybutynin Other (See Comments)   Penicillins Other (See Comments)    Listed on MAR as allergy Unknown reaction    Sulfa Antibiotics Other (See Comments)    Listed on MAR as allergy Unknown reaction    Level of Care/Admitting Diagnosis ED Disposition     ED Disposition  Admit   Condition  --   Comment  Hospital Area: [facility] [[id number]]  Level of Care: Med-Surg [16]  May admit patient to [facility] or [facility] if equivalent level of care is available:: Yes  Covid Evaluation: Asymptomatic - no recent exposure (last 10 days) testing not required  Diagnosis: Closed left ankle fracture [295621]  Admitting Physician: Darlin Drop [[id number]]  Attending Physician: Darlin Drop [[id number]]  Certification:: I certify this patient will need inpatient services for at least 2 midnights  Expected Medical Readiness: 01/12/2023          B Medical/Surgery History Past Medical History:  Diagnosis Date   Acquired hypothyroidism    Chronic diastolic heart failure (HCC)    COPD (chronic  obstructive pulmonary disease) (HCC)    Essential hypertension    Hyperlipidemia    Urinary incontinence    History reviewed. No pertinent surgical history.   A IV Location/Drains/Wounds Patient Lines/Drains/Airways Status     Active Line/Drains/Airways     Name Placement date Placement time Site Days   Peripheral IV 01/09/23 20 G Left Antecubital 01/09/23  2349  Antecubital  1            Intake/Output Last 24 hours No intake or output data in the 24 hours ending 01/10/23 0209  Labs/Imaging Results for orders placed or performed during the hospital encounter of 01/09/23 (from the past 48 hour(s))  Resp panel by RT-PCR (RSV, Flu A&B, Covid) Anterior Nasal Swab     Status: None   Collection Time: 01/09/23 11:20 PM   Specimen: Anterior Nasal Swab  Result Value Ref Range   SARS Coronavirus 2 by RT PCR NEGATIVE NEGATIVE    Comment: (NOTE) SARS-CoV-2 target nucleic acids are NOT DETECTED.  The SARS-CoV-2 RNA is generally detectable in upper respiratory specimens during the acute phase of infection. The lowest concentration of SARS-CoV-2 viral copies this assay can detect is 138 copies/mL. A negative result does not preclude SARS-Cov-2 infection and should not be used as the sole basis for treatment or other patient management decisions. A negative result may occur with  improper specimen collection/handling, submission of specimen other than nasopharyngeal swab, presence of viral mutation(s) within the areas targeted by this assay, and inadequate number of viral copies(<138  copies/mL). A negative result must be combined with clinical observations, patient history, and epidemiological information. The expected result is Negative.  Fact Sheet for Patients:  BloggerCourse.com  Fact Sheet for Healthcare Providers:  SeriousBroker.it  This test is no t yet approved or cleared by the [place] FDA and  has been authorized  for detection and/or diagnosis of SARS-CoV-2 by FDA under an Emergency Use Authorization (EUA). This EUA will remain  in effect (meaning this test can be used) for the duration of the COVID-19 declaration under Section 564(b)(1) of the Act, 21 U.S.C.section 360bbb-3(b)(1), unless the authorization is terminated  or revoked sooner.       Influenza A by PCR NEGATIVE NEGATIVE   Influenza B by PCR NEGATIVE NEGATIVE    Comment: (NOTE) The Xpert Xpress SARS-CoV-2/FLU/RSV plus assay is intended as an aid in the diagnosis of influenza from Nasopharyngeal swab specimens and should not be used as a sole basis for treatment. Nasal washings and aspirates are unacceptable for Xpert Xpress SARS-CoV-2/FLU/RSV testing.  Fact Sheet for Patients: BloggerCourse.com  Fact Sheet for Healthcare Providers: SeriousBroker.it  This test is not yet approved or cleared by the [place] FDA and has been authorized for detection and/or diagnosis of SARS-CoV-2 by FDA under an Emergency Use Authorization (EUA). This EUA will remain in effect (meaning this test can be used) for the duration of the COVID-19 declaration under Section 564(b)(1) of the Act, 21 U.S.C. section 360bbb-3(b)(1), unless the authorization is terminated or revoked.     Resp Syncytial Virus by PCR NEGATIVE NEGATIVE    Comment: (NOTE) Fact Sheet for Patients: BloggerCourse.com  Fact Sheet for Healthcare Providers: SeriousBroker.it  This test is not yet approved or cleared by the [place] FDA and has been authorized for detection and/or diagnosis of SARS-CoV-2 by FDA under an Emergency Use Authorization (EUA). This EUA will remain in effect (meaning this test can be used) for the duration of the COVID-19 declaration under Section 564(b)(1) of the Act, 21 U.S.C. section 360bbb-3(b)(1), unless the authorization is terminated  or revoked.  Performed at [facility], [address], [place], [place] [address]   CBC with Differential     Status: Abnormal   Collection Time: 01/09/23 11:33 PM  Result Value Ref Range   WBC 9.2 4.0 - 10.5 K/uL   RBC 3.32 (L) 3.87 - 5.11 MIL/uL   Hemoglobin 9.7 (L) 12.0 - 15.0 g/dL   HCT 62.7 (L) 03.5 - 00.9 %   MCV 95.5 80.0 - 100.0 fL   MCH 29.2 26.0 - 34.0 pg   MCHC 30.6 30.0 - 36.0 g/dL   RDW 38.1 82.9 - 93.7 %   Platelets 202 150 - 400 K/uL   nRBC 0.2 0.0 - 0.2 %   Neutrophils Relative % 77 %   Neutro Abs 7.2 1.7 - 7.7 K/uL   Lymphocytes Relative 11 %   Lymphs Abs 1.0 0.7 - 4.0 K/uL   Monocytes Relative 9 %   Monocytes Absolute 0.8 0.1 - 1.0 K/uL   Eosinophils Relative 2 %   Eosinophils Absolute 0.2 0.0 - 0.5 K/uL   Basophils Relative 0 %   Basophils Absolute 0.0 0.0 - 0.1 K/uL   Immature Granulocytes 1 %   Abs Immature Granulocytes 0.06 0.00 - 0.07 K/uL    Comment: Performed at [facility], [address], [place], [place] [address]  Basic metabolic panel     Status: Abnormal   Collection Time: 01/09/23 11:33 PM  Result Value Ref Range   Sodium 143 135 - 145 mmol/L   Potassium 3.5 3.5 - 5.1 mmol/L   Chloride 95 (L) 98 - 111 mmol/L   CO2 38 (H) 22 - 32 mmol/L   Glucose, Bld 126 (H) 70 - 99 mg/dL    Comment: Glucose reference range applies only to samples taken after fasting for at least 8 hours.   BUN 35 (H) 8 - 23 mg/dL   Creatinine, Ser 4.09 (H) 0.44 - 1.00 mg/dL   Calcium 9.1 8.9 - 81.1 mg/dL   GFR, Estimated 43 (L) >60 mL/min    Comment: (NOTE) Calculated using the CKD-EPI Creatinine Equation (2021)    Anion gap 10 5 - 15    Comment: Performed at [facility], [address], [place], [place] [address]  Urinalysis, Routine w reflex microscopic -Urine, Clean Catch     Status: Abnormal   Collection Time: 01/10/23  1:31 AM  Result Value Ref Range   Color, Urine YELLOW YELLOW   APPearance CLEAR  CLEAR   Specific Gravity, Urine 1.020 1.005 - 1.030   pH 5.0 5.0 - 8.0   Glucose, UA NEGATIVE NEGATIVE mg/dL   Hgb urine dipstick NEGATIVE NEGATIVE   Bilirubin Urine NEGATIVE NEGATIVE   Ketones, ur NEGATIVE NEGATIVE mg/dL   Protein, ur NEGATIVE NEGATIVE mg/dL   Nitrite NEGATIVE NEGATIVE   Leukocytes,Ua SMALL (A) NEGATIVE   RBC / HPF 0-5 0 - 5 RBC/hpf   WBC, UA 0-5 0 - 5 WBC/hpf   Bacteria, UA NONE SEEN NONE SEEN   Squamous Epithelial / HPF 0-5 0 - 5 /HPF    Comment: Performed at [facility], [address], [place], [place] [address]   DG Chest 1 View  Result Date: 01/09/2023 CLINICAL DATA:  Fall cough EXAM: CHEST  1 VIEW COMPARISON:  08/23/2021 FINDINGS: Cardiomegaly. Streaky atelectasis at the bases. No consolidation, pleural effusion or pneumothorax. Aortic atherosclerosis IMPRESSION: Cardiomegaly with streaky atelectasis at the bases. Electronically Signed   By: Jasmine Pang M.D.   On: 01/09/2023 23:46   DG Knee Complete 4 Views Left  Result Date: 01/09/2023 CLINICAL DATA:  Fall EXAM: LEFT KNEE - COMPLETE 4+ VIEW COMPARISON:  None Available. FINDINGS: No fracture or malalignment. Mild patellofemoral medial joint space degenerative change. No significant effusion IMPRESSION: No acute osseous abnormality. Electronically Signed   By: Jasmine Pang M.D.   On: 01/09/2023 23:45   DG Ankle Complete Left  Result Date: 01/09/2023 CLINICAL DATA:  Mechanical fall ankle deformity EXAM: LEFT ANKLE COMPLETE - 3+ VIEW COMPARISON:  None Available. FINDINGS: Acute fracture involving distal shaft of fibula with about 1/2 shaft diameter posterior displacement of distal fibular fracture fragment. Acute mildly displaced medial malleolar fracture is well. Mild lateral subluxation of talar dome with respect to the distal tibia. Copious soft tissue swelling IMPRESSION: Acute displaced distal fibular and medial malleolar fractures. Mild lateral subluxation of talar dome with respect to  the distal tibia. Electronically Signed   By: Jasmine Pang M.D.   On: 01/09/2023 23:44    Pending Labs Unresulted Labs (From admission, onward)     Start     Ordered   01/17/23 0500  Creatinine, serum  (enoxaparin (LOVENOX)    CrCl >/= 30 ml/min)  Weekly,   R     Comments: while on enoxaparin therapy    01/10/23 0144   01/10/23 0500  CBC  Tomorrow morning,   R  01/10/23 0145   01/10/23 0500  Basic metabolic panel  Tomorrow morning,   R        01/10/23 0145   01/10/23 0500  Magnesium  Tomorrow morning,   R        01/10/23 0145   01/10/23 0500  Phosphorus  Tomorrow morning,   R        01/10/23 0145            Vitals/Pain Today's Vitals   01/09/23 2205 01/10/23 0125 01/10/23 0142  BP: (!) 100/46 (!) 104/42   Pulse: 67 (!) 56   Resp: 17 18   Temp: 98 F (36.7 C) 98.3 F (36.8 C)   TempSrc:  Oral   SpO2: 98% 97%   Weight:   115 kg  PainSc: 5       Isolation Precautions No active isolations  Medications Medications  lactated ringers infusion (has no administration in time range)  enoxaparin (LOVENOX) injection 40 mg (has no administration in time range)  acetaminophen (TYLENOL) tablet 650 mg (has no administration in time range)  prochlorperazine (COMPAZINE) injection 5 mg (has no administration in time range)  polyethylene glycol (MIRALAX / GLYCOLAX) packet 17 g (has no administration in time range)  melatonin tablet 5 mg (has no administration in time range)  oxyCODONE (Oxy IR/ROXICODONE) immediate release tablet 5 mg (has no administration in time range)    Mobility walks with person assist; NWB-LLE      R Report given to: Logann R,

## 2023-01-10 NOTE — H&P (Signed)
History and Physical  Valerie Bowman GHW:[id number] DOB: Jan 26, 1936 DOA: 01/09/2023  Referring physician: Sharilyn Sites, PA-EDP  PCP: Valerie Houseman, NP  Outpatient Specialists: Cardiology, pulmonology. Patient coming from: Assisted living facility.  Chief Complaint: Fall.  HPI: Valerie Bowman is a 87 y.o. female with medical history significant for COPD, chronic diastolic CHF, hypertension, GERD, hypothyroidism, who presents to the ED from assisted living facility after a mechanical fall.  Reportedly her legs gave out and she fell onto her left side twisting her left ankle.  Denies any loss of consciousness or head injury.  EMS was activated and she was brought into the ED for further evaluation.  In the ED, UA is negative for pyuria.  Afebrile.  Left ankle x-ray showing acute displaced distal fibula and medial malleoli fractures.  Mild lateral subluxation of patella dome with respect to the distal tibia.  Orthopedic surgery consulted by EDP.  The patient received analgesics in the ED and TR[facility], hospitalist service, was asked to admit.  ED Course: Temperature 98.  BP 109/57, pulse 60, respiratory rate 20, saturation 94% on 2 L.  Lab studies notable for hemoglobin 9.7, WBC 9.2, platelet count 202.  Serum bicarb 38, BUN 36, creatinine 1.22.  GFR 43  Review of Systems: Review of systems as noted in the HPI. All other systems reviewed and are negative.   Past Medical History:  Diagnosis Date   Acquired hypothyroidism    Chronic diastolic heart failure (HCC)    COPD (chronic obstructive pulmonary disease) (HCC)    Essential hypertension    Hyperlipidemia    Urinary incontinence    History reviewed. No pertinent surgical history.  Social History:  reports that she quit smoking about 29 years ago. Her smoking use included cigarettes. She started smoking about 59 years ago. She has a 30 pack-year smoking history. She has never used smokeless tobacco. She reports that she does not currently use  alcohol. She reports that she does not use drugs.   Allergies  Allergen Reactions   Duloxetine Hcl Other (See Comments)   Oxybutynin Other (See Comments)   Penicillins Other (See Comments)    Listed on MAR as allergy Unknown reaction    Sulfa Antibiotics Other (See Comments)    Listed on MAR as allergy Unknown reaction    Family History  Problem Relation Age of Onset   Lung disease Neg Hx       Prior to Admission medications   Medication Sig Start Date End Date Taking? Authorizing Provider  albuterol (PROVENTIL) (2.5 MG/3ML) 0.083% nebulizer solution Take 2.5 mg by nebulization as needed.   Yes [provider]  albuterol (VENTOLIN HFA) 108 (90 Base) MCG/ACT inhaler Inhale 2 puffs into the lungs every 6 (six) hours as needed for wheezing or shortness of breath. 09/19/21  Yes Charlott Holler, MD  aspirin EC 81 MG tablet Take 81 mg by mouth daily.   Yes [provider]  benzonatate (TESSALON) 200 MG capsule Take 1 capsule (200 mg total) by mouth 3 (three) times daily as needed for cough. 01/15/22  Yes Cobb, Valerie Cola, NP  cetirizine (ZYRTEC) 5 MG tablet Take 5 mg by mouth daily.   Yes [provider]  dextromethorphan (DELSYM) 30 MG/5ML liquid Take 10 mLs by mouth every 12 (twelve) hours as needed for cough.   Yes [provider]  diclofenac Sodium (VOLTAREN) 1 % GEL Apply 2 g topically every 6 (six) hours as needed (pain).   Yes [provider]  enalapril (VASOTEC) 20 MG tablet Take 20 mg by mouth 2 (two) times daily. Hold for SBP < 110   Yes [provider]  fluticasone (FLONASE) 50 MCG/ACT nasal spray Place 1 spray into both nostrils daily as needed for allergies or rhinitis.   Yes [provider]  Fluticasone-Umeclidin-Vilant (TRELEGY ELLIPTA) 100-62.5-25 MCG/ACT AEPB Inhale 1 puff into the lungs daily. 12/19/21  Yes Cobb, Valerie Cola, NP  furosemide (LASIX) 40 MG tablet Take 40 mg by mouth daily.   Yes [provider]  Glycerin-Hypromellose-PEG 400 (ARTIFICIAL TEARS) 0.2-0.2-1 % SOLN Place 1 drop into both eyes 2 (two) times daily.   Yes [provider]  guaiFENesin (MUCINEX) 600 MG 12 hr tablet Take 600 mg by mouth 2 (two) times daily.   Yes [provider]  hydrochlorothiazide (HYDRODIURIL) 25 MG tablet Take 25 mg by mouth daily. 09/06/22  Yes [provider]  ibuprofen (ADVIL) 600 MG tablet Take 600 mg by mouth every 6 (six) hours as needed for mild pain (pain score 1-3) or moderate pain (pain score 4-6). 12/09/22  Yes [provider]  Multiple Vitamins-Minerals (ALIVE WOMENS 50+ GUMMY PO) Take 1 Dose by mouth in the morning.   Yes [provider]  pantoprazole (PROTONIX) 40 MG tablet Take 1 tablet (40 mg total) by mouth 2 (two) times daily before a meal. 07/22/22  Yes Charlott Holler, MD  polyethylene glycol (MIRALAX / GLYCOLAX) 17 g packet Take 17 g by mouth daily as needed for mild constipation or moderate constipation.   Yes [provider]  senna (SENOKOT) 8.6 MG TABS tablet Take 1 tablet by mouth 2 (two) times daily.   Yes [provider]  SYNTHROID 175 MCG tablet Take 175 mcg by mouth 2 (two) times daily. 09/30/22  Yes [provider]  vitamin B-12 (CYANOCOBALAMIN) 500 MCG tablet Take 1 tablet (500 mcg total) by mouth daily. 08/26/21  Yes Elgergawy, Valerie Roe, MD  amLODipine (NORVASC) 5 MG tablet Take 5 mg by mouth daily. Patient not taking: Reported on 01/10/2023    [provider]    Physical Exam: BP (!) 109/57 (BP Location: Right Arm)   Pulse 60   Temp 98 F (36.7 C) (Oral)   Resp 20   Wt 115 kg   SpO2 94%   BMI 40.92 kg/m   General: 87 y.o. year-old female well developed well nourished in no acute distress.  Somnolent but easily arousable to voice. Cardiovascular: Regular rate and rhythm with no rubs or gallops.  No thyromegaly or JVD noted.  No lower extremity edema.  Respiratory: Clear to  auscultation with no wheezes or rales. Good inspiratory effort. Abdomen: Soft nontender nondistended with normal bowel sounds x4 quadrants. Muskuloskeletal: No cyanosis or clubbing noted Neuro: CN II-XII intact, strength, sensation, reflexes Skin: No ulcerative lesions noted or rashes Psychiatry: Mood is appropriate for condition and setting          Labs on Admission:  Basic Metabolic Panel: Recent Labs  Lab 01/09/23 2333  NA 143  K 3.5  CL 95*  CO2 38*  GLUCOSE 126*  BUN 35*  CREATININE 1.22*  CALCIUM 9.1   Liver Function Tests: No results for input(s): "AST", "ALT", "ALKPHOS", "BILITOT", "PROT", "ALBUMIN" in the last 168 hours. No results for input(s): "LIPASE", "AMYLASE" in the last 168 hours. No results for input(s): "AMMONIA" in the last 168 hours. CBC: Recent Labs  Lab 01/09/23 2333  WBC 9.2  NEUTROABS 7.2  HGB 9.7*  HCT 31.7*  MCV 95.5  PLT 202   Cardiac Enzymes: No results for input(s): "CKTOTAL", "CKMB", "CKMBINDEX", "TROPONINI" in the last 168 hours.  BNP (last 3 results) No results for input(s): "BNP" in the last 8760 hours.  ProBNP (last 3 results) No results for input(s): "PROBNP" in the last 8760 hours.  CBG: No results for input(s): "GLUCAP" in the last 168 hours.  Radiological Exams on Admission: DG Chest 1 View  Result Date: 01/09/2023 CLINICAL DATA:  Fall cough EXAM: CHEST  1 VIEW COMPARISON:  08/23/2021 FINDINGS: Cardiomegaly. Streaky atelectasis at the bases. No consolidation, pleural effusion or pneumothorax. Aortic atherosclerosis IMPRESSION: Cardiomegaly with streaky atelectasis at the bases. Electronically Signed   By: Jasmine Pang M.D.   On: 01/09/2023 23:46   DG Knee Complete 4 Views Left  Result Date: 01/09/2023 CLINICAL DATA:  Fall EXAM: LEFT KNEE - COMPLETE 4+ VIEW COMPARISON:  None Available. FINDINGS: No fracture or malalignment. Mild patellofemoral medial joint space degenerative change. No significant effusion IMPRESSION:  No acute osseous abnormality. Electronically Signed   By: Jasmine Pang M.D.   On: 01/09/2023 23:45   DG Ankle Complete Left  Result Date: 01/09/2023 CLINICAL DATA:  Mechanical fall ankle deformity EXAM: LEFT ANKLE COMPLETE - 3+ VIEW COMPARISON:  None Available. FINDINGS: Acute fracture involving distal shaft of fibula with about 1/2 shaft diameter posterior displacement of distal fibular fracture fragment. Acute mildly displaced medial malleolar fracture is well. Mild lateral subluxation of talar dome with respect to the distal tibia. Copious soft tissue swelling IMPRESSION: Acute displaced distal fibular and medial malleolar fractures. Mild lateral subluxation of talar dome with respect to the distal tibia. Electronically Signed   By: Jasmine Pang M.D.   On: 01/09/2023 23:44    EKG: I independently viewed the EKG done and my findings are as followed: None available at time of this visit.  Assessment/Plan Present on Admission:  Closed left ankle fracture  Principal Problem:   Closed left ankle fracture  Left ankle fracture status post mechanical fall, POA Pain control As needed analgesics, as needed antiemetics Orthopedic surgery consulted by EDP PT OT will be done with the guidance of orthopedic surgery No weightbearing of left lower extremity until seen by orthopedic surgery. TOC consulted to assist with discharge planning.  The patient is from assisted living facility.  Hypothyroidism Resume home Synthroid  COPD Resume home regimen Maintain O2 saturation above 90%  Hypertension BPs are currently soft Hold off home oral antihypertensives. Closely monitor vital signs  Chronic HFpEF Last 2D echo done on 10/29/2022 revealed LVEF 55 to 60% Euvolemic on exam Closely monitor volume status while on IV fluid Resume cardiac medication as blood pressure allows Monitor strict I's and O's and daily weight  GERD Resume home PPI   Time: 75 minutes   DVT prophylaxis: Subcu  Lovenox daily  Code Status: DNR  Family Communication: None at bedside  Disposition Plan: Admitted to MedSurg unit  Consults called: Orthopedic surgery consulted by EDP. Marland Kitchen  Admission status: Inpatient status.   Status is: Inpatient The patient requires at least 2 midnights for further evaluation and treatment of present condition.   Darlin Drop MD [facility] Pager [phone]  If 7PM-7AM, please contact night-coverage [email or web address] Password [facility]  01/10/2023, 5:45 AM

## 2023-01-11 DIAGNOSIS — S82892A Other fracture of left lower leg, initial encounter for closed fracture: Secondary | ICD-10-CM | POA: Diagnosis not present

## 2023-01-11 LAB — BASIC METABOLIC PANEL
Anion gap: 10 (ref 5–15)
BUN: 38 mg/dL — ABNORMAL HIGH (ref 8–23)
CO2: 37 mmol/L — ABNORMAL HIGH (ref 22–32)
Calcium: 9.1 mg/dL (ref 8.9–10.3)
Chloride: 92 mmol/L — ABNORMAL LOW (ref 98–111)
Creatinine, Ser: 1.47 mg/dL — ABNORMAL HIGH (ref 0.44–1.00)
GFR, Estimated: 35 mL/min — ABNORMAL LOW (ref >60)
Glucose, Bld: 114 mg/dL — ABNORMAL HIGH (ref 70–99)
Potassium: 3.9 mmol/L (ref 3.5–5.1)
Sodium: 139 mmol/L (ref 135–145)

## 2023-01-11 LAB — MAGNESIUM: Magnesium: 2 mg/dL (ref 1.7–2.4)

## 2023-01-11 LAB — CBC
HCT: 31.7 % — ABNORMAL LOW (ref 36.0–46.0)
Hemoglobin: 9.7 g/dL — ABNORMAL LOW (ref 12.0–15.0)
MCH: 29.4 pg (ref 26.0–34.0)
MCHC: 30.6 g/dL (ref 30.0–36.0)
MCV: 96.1 fL (ref 80.0–100.0)
Platelets: 190 10*3/uL (ref 150–400)
RBC: 3.3 MIL/uL — ABNORMAL LOW (ref 3.87–5.11)
RDW: 15.9 % — ABNORMAL HIGH (ref 11.5–15.5)
WBC: 8.3 10*3/uL (ref 4.0–10.5)
nRBC: 0.4 % — ABNORMAL HIGH (ref 0.0–0.2)

## 2023-01-11 MED ORDER — FLUTICASONE PROPIONATE 50 MCG/ACT NA SUSP
1.0000 | Freq: Every day | NASAL | Status: DC
  Administered 2023-01-11 – 2023-01-14 (×4): 1 via NASAL
  Filled 2023-01-11: qty 16

## 2023-01-11 NOTE — Evaluation (Signed)
Occupational Therapy Evaluation Patient Details Name: Valerie Bowman MRN: [id number] DOB: 03/30/1935 Today's Date: 01/11/2023   History of Present Illness 87 yo female presents to therapy following hospital admission on 01/09/2023 due to mechanical fall at [facility]. Fall resulted in L ankle trimalleolar fx, orthopedics recommends conservative measures at this time due to extensive edema. Pt is L LE NWB. Pt PMH includes but is not limited to: falls, hypothyroidism, COPD, HTN, chronic HFpEF and GERD.   Clinical Impression   Patient is currently requiring assistance with ADLs including Total assist +2 with Lower body ADLs at bed level, up to maximum assist with Upper body ADLs while EOB due to poor balance and need of UE support as wel as up to Max As with strong RT lean.  Pt required  up to Total assist of 2 with bed mobility and was unable to attemptl standing due to NWB to RLE and LLE lacking strength to power up.    Current level of function is below patient's typical baseline although she is universally assisted at [facility].  During this evaluation, patient was limited by generalized weakness, impaired activity tolerance, neuropathy to all extremities, cognitive deficits, pain and spasms to RLE, RLE WB restrictions and obesity, all of which has the potential to impact patient's safety and independence during functional mobility, as well as performance for ADLs.  Patient lives at an ALF which provides 24/7 supervision and assistance.  Patient demonstrates fair rehab potential, and should benefit from continued skilled occupational therapy services while in acute care to maximize safety, independence and quality of life at home.  Continued occupational therapy services are recommended.  ?        If plan is discharge home, recommend the following: Assistance with cooking/housework;Supervision due to cognitive status;Assistance with feeding;Assist for transportation;Help with stairs or  ramp for entrance;Two people to help with bathing/dressing/bathroom;Two people to help with walking and/or transfers;A lot of help with bathing/dressing/bathroom;A lot of help with walking and/or transfers    Functional Status Assessment  Patient has had a recent decline in their functional status and demonstrates the ability to make significant improvements in function in a reasonable and predictable amount of time.  Equipment Recommendations   (Will defer to next LOC)    Recommendations for Other Services       Precautions / Restrictions Precautions Precautions: Fall Required Braces or Orthoses: Splint/Cast Restrictions Weight Bearing Restrictions: Yes LLE Weight Bearing: Non weight bearing      Mobility Bed Mobility Overal bed mobility: Needs Assistance Bed Mobility: Rolling, Sidelying to Sit, Sit to Supine Rolling: Max assist, Used rails Sidelying to sit: Total assist, Used rails, HOB elevated   Sit to supine: Total assist, +2 for physical assistance        Transfers                   General transfer comment: NT recommend total mechanical lift if pt is required to get OOB      Balance Overall balance assessment: Needs assistance, History of Falls Sitting-balance support: Feet supported, Bilateral upper extremity supported, Single extremity supported Sitting balance-Leahy Scale: Poor Sitting balance - Comments: Pt with a strong R lateral lean, pt self limiting due to fear avoidance but responded well to reassurance. Initially required Max As, then mod A for initial sitting balance and able to progress to min A with ongoing cues for attention to midline and RUE support on arm of chair. Pt leaned her RT elbowe on arm  of chair while performing RT handed oral hygiene.                                   ADL either performed or assessed with clinical judgement   ADL Overall ADL's : Needs assistance/impaired Eating/Feeding: Moderate assistance;Bed  level   Grooming: Moderate assistance;Sitting;Oral care;Wash/dry face Grooming Details (indicate cue type and reason): EOB with need of unilateral support and hard lateral lean. While EOB, pt briugh recliner close by to stabilize self on arm of chair with RUE. Upper Body Bathing: Maximal assistance;Bed level;Sitting   Lower Body Bathing: +2 for physical assistance;Total assistance;Bed level   Upper Body Dressing : Bed level;Minimal assistance;Cueing for compensatory techniques;Cueing for sequencing   Lower Body Dressing: +2 for physical assistance;Total assistance;Bed level     Toilet Transfer Details (indicate cue type and reason): Unable Toileting- Clothing Manipulation and Hygiene: Total assistance;+2 for physical assistance;Bed level               Vision Baseline Vision/History: 1 Wears glasses Ability to See in Adequate Light: 1 Impaired       Perception         Praxis         Pertinent Vitals/Pain Pain Assessment Pain Assessment: Faces Faces Pain Scale: Hurts little more Pain Location: L LE "top of my leg" Pain Descriptors / Indicators: Aching, Discomfort, Guarding Pain Intervention(s): Limited activity within patient's tolerance, Monitored during session, Repositioned, Relaxation     Extremity/Trunk Assessment Upper Extremity Assessment Upper Extremity Assessment: Generalized weakness;Right hand dominant;RUE deficits/detail;LUE deficits/detail RUE Deficits / Details: Tends to drop things due to neuropathy. Dropped water cup but able to use handle on water picture with ncreased effort. RUE Sensation: history of peripheral neuropathy RUE Coordination: decreased fine motor LUE Deficits / Details: Tends to drop things due to neuropathy. LUE Sensation: history of peripheral neuropathy LUE Coordination: decreased fine motor   Lower Extremity Assessment Lower Extremity Assessment: RLE deficits/detail;LLE deficits/detail RLE Sensation: history of peripheral  neuropathy LLE Sensation: history of peripheral neuropathy   Cervical / Trunk Assessment Cervical / Trunk Assessment: Kyphotic;Other exceptions Cervical / Trunk Exceptions: Body habitus and LLE edema   Communication Communication Communication: Difficulty following commands/understanding;Hearing impairment Following commands: Follows one step commands consistently;Follows multi-step commands inconsistently;Follows multi-step commands with increased time Cueing Techniques: Verbal cues;Gestural cues;Tactile cues;Visual cues   Cognition Arousal: Suspect due to medications Behavior During Therapy: Flat affect, Anxious Overall Cognitive Status: Impaired/Different from baseline Area of Impairment: Attention, Awareness, Safety/judgement, Following commands, Memory                   Current Attention Level: Selective   Following Commands: Follows one step commands inconsistently Safety/Judgement: Decreased awareness of safety, Decreased awareness of deficits Awareness: Anticipatory   General Comments: Oriented x4.     General Comments       Exercises Other Exercises Other Exercises: Pt educated on AROM to UEs and to RLE as able including shoulder flexion, horiz add/abd, RT ankle pumps and straight leg raises as able. PT demonstrated each exercsies for 1-2 reps but no more due to fatigue after sitting EOB.   Shoulder Instructions      Home Living Family/patient expects to be discharged to:: Assisted living                             Home Equipment: Rolling Walker (2  wheels);Wheelchair - manual;Lift chair   Additional Comments: [facility]. pt required A for all functional mobility tasks, primarily used wc, however dependent for mobility and slept in a lift chair.  Pt's wheelchair is without LE supports.      Prior Functioning/Environment Prior Level of Function : Needs assist;History of Falls (last six months)       Physical Assist : ADLs  (physical);Mobility (physical) Mobility (physical): Transfers;Bed mobility ADLs (physical): Grooming;Dressing;Bathing;IADLs;Toileting Mobility Comments: pt required A for all functional mobility tasks primarily at wc level ADLs Comments: staff assist wtih medication management, ADLs and meals. Pt voids into a brief and staff performs hygiene at bed level.        OT Problem List: Decreased strength;Decreased knowledge of use of DME or AE;Impaired tone;Increased edema;Obesity;Decreased knowledge of precautions;Decreased coordination;Decreased range of motion;Decreased activity tolerance;Impaired UE functional use;Impaired sensation;Pain;Impaired balance (sitting and/or standing)      OT Treatment/Interventions: Self-care/ADL training;Balance training;Therapeutic exercise;Therapeutic activities;DME and/or AE instruction;Visual/perceptual remediation/compensation    OT Goals(Current goals can be found in the care plan section) Acute Rehab OT Goals Patient Stated Goal: "I'd like to sit up" OT Goal Formulation: With patient Time For Goal Achievement: 01/25/23 Potential to Achieve Goals: Fair ADL Goals Pt Will Perform Grooming: with min assist;sitting Pt Will Perform Upper Body Dressing: sitting;with contact guard assist;with set-up Pt/caregiver will Perform Home Exercise Program: Increased strength;Both right and left upper extremity;With Supervision Additional ADL Goal #1: Pt will perform squat-pivot vs stand pivot with LRAD to drop arm vs non-drop arm recliner in order to simulate WC transfers for ALF. Additional ADL Goal #2: Pt will improve ocverall sitting balance to no more than CGA with cues as needed and tolerate 20 min sitting EOB in order to increase activity tolerance needed for seated ADLs.  OT Frequency: Min 1X/week    Co-evaluation              AM-PAC OT "6 Clicks" Daily Activity     Outcome Measure Help from another person eating meals?: A Lot Help from another person  taking care of personal grooming?: A Lot Help from another person toileting, which includes using toliet, bedpan, or urinal?: Total Help from another person bathing (including washing, rinsing, drying)?: Total Help from another person to put on and taking off regular upper body clothing?: A Little Help from another person to put on and taking off regular lower body clothing?: Total 6 Click Score: 10   End of Session Equipment Utilized During Treatment: Oxygen Nurse Communication: Mobility status;Need for lift equipment  Activity Tolerance: Patient limited by fatigue;Patient limited by pain Patient left: in bed;with call bell/phone within reach;with bed alarm set  OT Visit Diagnosis: Pain;History of falling (Z91.81);Muscle weakness (generalized) (M62.81);Repeated falls (R29.6);Apraxia (R48.2);Feeding difficulties (R63.3) Pain - Right/Left: Right Pain - part of body: Leg;Ankle and joints of foot                Time: 1610-9604 OT Time Calculation (min): 24 min Charges:  OT General Charges $OT Visit: 1 Visit OT Evaluation $OT Eval Low Complexity: 1 Low OT Treatments $Self Care/Home Management : 8-22 mins  Victorino Dike, OT Acute Rehab Services Office: [phone] 01/11/2023   Theodoro Clock 01/11/2023, 2:29 PM

## 2023-01-11 NOTE — TOC Progression Note (Signed)
Transition of Care [facility]) - Progression Note    Patient Details  Name: Valerie Bowman MRN: [id number] Date of Birth: Apr 14, 1935  [facility] CM/SW Contact  Georgie Chard, LCSW Phone Number: 01/11/2023, 1:31 PM  Clinical Narrative:   CSW spoke to the patient's daughter. At this time the patient only has one bed offer which is [facility]. This CSW has explained that no offers were denied however they are still pending. Per DO patient is looking to DC on Monday. Daughter is aware that if no other offers come through that [facility] would be the only option. Daughter reports wanting to send a referral to [facility] which this CSW will do. At this time TOC will continue to follow.    Expected Discharge Plan: Skilled Nursing Facility Barriers to Discharge: Continued Medical Work up, SNF Pending bed offer  Expected Discharge Plan and Services In-house Referral: Clinical Social Work   Post Acute Care Choice: Skilled Nursing Facility Living arrangements for the past 2 months: Assisted Living Facility                                       Social Determinants of Health (SDOH) Interventions SDOH Screenings   Food Insecurity: No Food Insecurity (01/10/2023)  Housing: Low Risk  (01/10/2023)  Transportation Needs: No Transportation Needs (01/10/2023)  Utilities: Not At Risk (01/10/2023)  Financial Resource Strain: Low Risk  (09/10/2020)   Received from [facility]  Physical Activity: Inactive (09/10/2020)   Received from [facility]  Social Connections: Moderately Isolated (09/10/2020)   Received from [facility]  Stress: No Stress Concern Present (09/10/2020)   Received from [facility]  Tobacco Use: Medium Risk (01/09/2023)    Readmission Risk Interventions     No data to display

## 2023-01-11 NOTE — Plan of Care (Signed)
  Problem: Education: Goal: Knowledge of General Education information will improve Description: Including pain rating scale, medication(s)/side effects and non-pharmacologic comfort measures Outcome: Progressing   Problem: Clinical Measurements: Goal: Ability to maintain clinical measurements within normal limits will improve Outcome: Progressing Goal: Will remain free from infection Outcome: Progressing Goal: Diagnostic test results will improve Outcome: Progressing Goal: Respiratory complications will improve Outcome: Progressing Goal: Cardiovascular complication will be avoided Outcome: Progressing   Problem: Nutrition: Goal: Adequate nutrition will be maintained Outcome: Progressing   Problem: Coping: Goal: Level of anxiety will decrease Outcome: Progressing   Problem: Elimination: Goal: Will not experience complications related to bowel motility Outcome: Progressing Goal: Will not experience complications related to urinary retention Outcome: Progressing   Problem: Pain Management: Goal: General experience of comfort will improve Outcome: Progressing   Problem: Safety: Goal: Ability to remain free from injury will improve Outcome: Progressing   Problem: Skin Integrity: Goal: Risk for impaired skin integrity will decrease Outcome: Progressing

## 2023-01-11 NOTE — Progress Notes (Signed)
PROGRESS NOTE    Valerie Bowman  ZOX:[id number] DOB: [birth year] DOA: 01/09/2023 PCP: Valerie Houseman, NP     Brief Narrative:  Valerie Bowman is a 87 y.o. female with medical history significant for COPD, chronic diastolic CHF, hypertension, GERD, hypothyroidism, who presents to the ED from assisted living facility after a mechanical fall.  Reportedly her legs gave out and she fell onto her left side twisting her left ankle.  Denies any loss of consciousness or head injury.  EMS was activated and she was brought into the ED for further evaluation.  Left ankle x-ray showing acute displaced distal fibula and medial malleoli fractures. Mild lateral subluxation of patella dome with respect to the distal tibia. Orthopedic surgery consulted by EDP.   New events last 24 hours / Subjective: Having more back pain today than left leg pain.  Daughter is at bedside.  Assessment & Plan:   Principal Problem:   Closed left ankle fracture Active Problems:   Low back pain   Acquired hypothyroidism   Chronic diastolic heart failure (HCC)   OSA (obstructive sleep apnea)   Chronic cough   Closed left ankle fracture -Orthopedic surgery following -Need SNF placement  CKD stage IIIb -Baseline creatinine 1.2  Chronic diastolic CHF -Will hold HCTZ, Lasix in setting of creatinine trending up  Bibasilar atelectasis -Encourage deep breaths, incentive spirometry, flutter valve  Hypothyroidism -Synthroid  Vitamin B12 deficiency -Continue cyanocobalamin    DVT prophylaxis:  enoxaparin (LOVENOX) injection 40 mg Start: 01/10/23 1000  Code Status: DNR, no CPR, DNI Family Communication: Daughter at bedside Disposition Plan: SNF Status is: Inpatient Remains inpatient appropriate because: Placement pending    Antimicrobials:  Anti-infectives (From admission, onward)    None        Objective: Vitals:   01/10/23 2126 01/11/23 0626 01/11/23 0841 01/11/23 1156  BP:  (!) 96/53  (!) 96/44  Pulse:  (!) 58 (!) 55  66  Resp: 17 18    Temp:  98.6 F (37 C)  98.5 F (36.9 C)  TempSrc:    Oral  SpO2: 97% 91% 93% 96%  Weight:      Height:        Intake/Output Summary (Last 24 hours) at 01/11/2023 1333 Last data filed at 01/11/2023 0900 Gross per 24 hour  Intake 240 ml  Output --  Net 240 ml   Filed Weights   01/10/23 0142 01/10/23 0607  Weight: 115 kg 117.2 kg    Examination:  General exam: Appears calm and comfortable  Respiratory system: Bibasilar fine crackles, clear otherwise, no distress Cardiovascular system: S1 & S2 heard, RRR. No murmurs.  Gastrointestinal system: Abdomen is nondistended, soft and nontender. Normal bowel sounds heard. Central nervous system: Alert  Extremities: Left lower extremity in splint Skin: No rashes, lesions or ulcers on exposed skin   Data Reviewed: I have personally reviewed following labs and imaging studies  CBC: Recent Labs  Lab 01/09/23 2333 01/10/23 0445 01/11/23 0701  WBC 9.2 8.2 8.3  NEUTROABS 7.2  --   --   HGB 9.7* 9.1* 9.7*  HCT 31.7* 30.2* 31.7*  MCV 95.5 96.5 96.1  PLT 202 201 190   Basic Metabolic Panel: Recent Labs  Lab 01/09/23 2333 01/10/23 0445 01/11/23 0701  NA 143 140 139  K 3.5 3.2* 3.9  CL 95* 93* 92*  CO2 38* 34* 37*  GLUCOSE 126* 107* 114*  BUN 35* 34* 38*  CREATININE 1.22* 1.20* 1.47*  CALCIUM 9.1 8.9 9.1  MG  --  2.1 2.0  PHOS  --  3.8  --    GFR: Estimated Creatinine Clearance: 36.4 mL/min (A) (by C-G formula based on SCr of 1.47 mg/dL (H)). Liver Function Tests: No results for input(s): "AST", "ALT", "ALKPHOS", "BILITOT", "PROT", "ALBUMIN" in the last 168 hours. No results for input(s): "LIPASE", "AMYLASE" in the last 168 hours. No results for input(s): "AMMONIA" in the last 168 hours. Coagulation Profile: No results for input(s): "INR", "PROTIME" in the last 168 hours. Cardiac Enzymes: No results for input(s): "CKTOTAL", "CKMB", "CKMBINDEX", "TROPONINI" in the last 168 hours. BNP  (last 3 results) No results for input(s): "PROBNP" in the last 8760 hours. HbA1C: No results for input(s): "HGBA1C" in the last 72 hours. CBG: No results for input(s): "GLUCAP" in the last 168 hours. Lipid Profile: No results for input(s): "CHOL", "HDL", "LDLCALC", "TRIG", "CHOLHDL", "LDLDIRECT" in the last 72 hours. Thyroid Function Tests: No results for input(s): "TSH", "T4TOTAL", "FREET4", "T3FREE", "THYROIDAB" in the last 72 hours. Anemia Panel: No results for input(s): "VITAMINB12", "FOLATE", "FERRITIN", "TIBC", "IRON", "RETICCTPCT" in the last 72 hours. Sepsis Labs: No results for input(s): "PROCALCITON", "LATICACIDVEN" in the last 168 hours.  Recent Results (from the past 240 hour(s))  Resp panel by RT-PCR (RSV, Flu A&B, Covid) Anterior Nasal Swab     Status: None   Collection Time: 01/09/23 11:20 PM   Specimen: Anterior Nasal Swab  Result Value Ref Range Status   SARS Coronavirus 2 by RT PCR NEGATIVE NEGATIVE Final    Comment: (NOTE) SARS-CoV-2 target nucleic acids are NOT DETECTED.  The SARS-CoV-2 RNA is generally detectable in upper respiratory specimens during the acute phase of infection. The lowest concentration of SARS-CoV-2 viral copies this assay can detect is 138 copies/mL. A negative result does not preclude SARS-Cov-2 infection and should not be used as the sole basis for treatment or other patient management decisions. A negative result may occur with  improper specimen collection/handling, submission of specimen other than nasopharyngeal swab, presence of viral mutation(s) within the areas targeted by this assay, and inadequate number of viral copies(<138 copies/mL). A negative result must be combined with clinical observations, patient history, and epidemiological information. The expected result is Negative.  Fact Sheet for Patients:  BloggerCourse.com  Fact Sheet for Healthcare Providers:   SeriousBroker.it  This test is no t yet approved or cleared by the [place] FDA and  has been authorized for detection and/or diagnosis of SARS-CoV-2 by FDA under an Emergency Use Authorization (EUA). This EUA will remain  in effect (meaning this test can be used) for the duration of the COVID-19 declaration under Section 564(b)(1) of the Act, 21 U.S.C.section 360bbb-3(b)(1), unless the authorization is terminated  or revoked sooner.       Influenza A by PCR NEGATIVE NEGATIVE Final   Influenza B by PCR NEGATIVE NEGATIVE Final    Comment: (NOTE) The Xpert Xpress SARS-CoV-2/FLU/RSV plus assay is intended as an aid in the diagnosis of influenza from Nasopharyngeal swab specimens and should not be used as a sole basis for treatment. Nasal washings and aspirates are unacceptable for Xpert Xpress SARS-CoV-2/FLU/RSV testing.  Fact Sheet for Patients: BloggerCourse.com  Fact Sheet for Healthcare Providers: SeriousBroker.it  This test is not yet approved or cleared by the [place] FDA and has been authorized for detection and/or diagnosis of SARS-CoV-2 by FDA under an Emergency Use Authorization (EUA). This EUA will remain in effect (meaning this test can be used) for the duration of the COVID-19 declaration under Section 564(b)(1) of  the Act, 21 U.S.C. section 360bbb-3(b)(1), unless the authorization is terminated or revoked.     Resp Syncytial Virus by PCR NEGATIVE NEGATIVE Final    Comment: (NOTE) Fact Sheet for Patients: BloggerCourse.com  Fact Sheet for Healthcare Providers: SeriousBroker.it  This test is not yet approved or cleared by the [place] FDA and has been authorized for detection and/or diagnosis of SARS-CoV-2 by FDA under an Emergency Use Authorization (EUA). This EUA will remain in effect (meaning this test can be used) for  the duration of the COVID-19 declaration under Section 564(b)(1) of the Act, 21 U.S.C. section 360bbb-3(b)(1), unless the authorization is terminated or revoked.  Performed at [facility], [address], [place], [place] [address]       Radiology Studies: CT ANKLE LEFT WO CONTRAST  Result Date: 01/10/2023 CLINICAL DATA:  Ankle fracture status post recent fall. EXAM: CT OF THE LEFT ANKLE WITHOUT CONTRAST TECHNIQUE: Multidetector CT imaging of the left ankle was performed according to the standard protocol. Multiplanar CT image reconstructions were also generated. RADIATION DOSE REDUCTION: This exam was performed according to the departmental dose-optimization program which includes automated exposure control, adjustment of the mA and/or kV according to patient size and/or use of iterative reconstruction technique. COMPARISON:  Radiographs 01/09/2023. FINDINGS: Bones/Joint/Cartilage The ankle is splinted. The bones are demineralized. There is a trimalleolar fracture. Oblique fracture of the distal fibular metadiaphysis demonstrates up to 5 mm of posterolateral displacement. Oblique fracture through the base of the medial malleolus extends into the metaphysis and demonstrates up to 5 mm of displacement. There is a comminuted fracture of the posterior malleolus with extension into the distal tibiofibular joint. This fracture demonstrates up to 5 mm of impaction of the distal articular surface of the tibial plafond. The talar dome is intact and located. There is mild lateral subluxation of the talus relative to the tibial plafond. The additional tarsal bones appear intact. Ligaments Suboptimally assessed by CT. Muscles and Tendons As evaluated by CT, the ankle tendons appear intact and normally located. No entrapment of the tendons in the fractures is seen. There is a prominent type 1 accessory navicular. Soft tissues Moderate soft tissue swelling around the ankle without evidence of  focal hematoma, foreign body or soft tissue emphysema. IMPRESSION: 1. Trimalleolar fracture of the left ankle as described. 2. Mild lateral subluxation of the talus relative to the tibial plafond. 3. The talar dome is intact and located. 4. The ankle tendons appear intact and normally located. Electronically Signed   By: Carey Bullocks M.D.   On: 01/10/2023 15:55   DG Chest 1 View  Result Date: 01/09/2023 CLINICAL DATA:  Fall cough EXAM: CHEST  1 VIEW COMPARISON:  08/23/2021 FINDINGS: Cardiomegaly. Streaky atelectasis at the bases. No consolidation, pleural effusion or pneumothorax. Aortic atherosclerosis IMPRESSION: Cardiomegaly with streaky atelectasis at the bases. Electronically Signed   By: Jasmine Pang M.D.   On: 01/09/2023 23:46   DG Knee Complete 4 Views Left  Result Date: 01/09/2023 CLINICAL DATA:  Fall EXAM: LEFT KNEE - COMPLETE 4+ VIEW COMPARISON:  None Available. FINDINGS: No fracture or malalignment. Mild patellofemoral medial joint space degenerative change. No significant effusion IMPRESSION: No acute osseous abnormality. Electronically Signed   By: Jasmine Pang M.D.   On: 01/09/2023 23:45   DG Ankle Complete Left  Result Date: 01/09/2023 CLINICAL DATA:  Mechanical fall ankle deformity EXAM: LEFT ANKLE COMPLETE - 3+ VIEW COMPARISON:  None Available. FINDINGS: Acute fracture involving distal shaft of fibula with  about 1/2 shaft diameter posterior displacement of distal fibular fracture fragment. Acute mildly displaced medial malleolar fracture is well. Mild lateral subluxation of talar dome with respect to the distal tibia. Copious soft tissue swelling IMPRESSION: Acute displaced distal fibular and medial malleolar fractures. Mild lateral subluxation of talar dome with respect to the distal tibia. Electronically Signed   By: Jasmine Pang M.D.   On: 01/09/2023 23:44      Scheduled Meds:  albuterol  2.5 mg Nebulization BID   aspirin EC  81 mg Oral Daily   cyanocobalamin  500 mcg  Oral Daily   enoxaparin (LOVENOX) injection  40 mg Subcutaneous Q24H   fluticasone  1 spray Each Nare Daily   fluticasone furoate-vilanterol  1 puff Inhalation Daily   And   umeclidinium bromide  1 puff Inhalation Daily   levothyroxine  175 mcg Oral BID   pantoprazole  40 mg Oral BID AC   senna  1 tablet Oral BID   sodium chloride flush  10 mL Intravenous Q12H   Continuous Infusions:   LOS: 1 day   Time spent: 25 minutes   Noralee Stain, DO [facility] 01/11/2023, 1:33 PM   Available via Epic secure chat 7am-7pm After these hours, please refer to coverage provider listed on [email or web address]

## 2023-01-11 NOTE — Plan of Care (Signed)

## 2023-01-11 NOTE — Progress Notes (Signed)
   01/11/23 2103  BiPAP/CPAP/SIPAP  BiPAP/CPAP/SIPAP Pt Type  (avaps)  BiPAP/CPAP/SIPAP  (trilogy)  Mask Type Nasal pillows  Respiratory Rate 24 breaths/min  IPAP  (5/26)  EPAP  (4/15)  FiO2 (%) 28 %  Flow Rate 2 lpm  Tidal Volume (Vt) 300  Patient Home Equipment Yes

## 2023-01-11 NOTE — Progress Notes (Signed)
Patient ID: Valerie Bowman, female   DOB: 01-Jun-1935, 87 y.o.   MRN: [id number] I came by and spoke with the patient at the bedside as well as her daughter.  I explained that she does have a complex left ankle fracture and there is a good potential recommending surgery for this fracture.  Right now it is a trimalleolar fracture but it is in alignment this does not cause soft tissue compromise which is important.  She is in a well-padded splint as well.  The patient prefers nonoperative treatment and she is of such low physical demand at her age and comorbidities that we may try nonoperative treatment with casting.  As of now, I would leave the splint in place with elevation given soft tissue swelling.  My plan would be to reevaluate her soft tissues in 3 to 4 days and potentially convert her to a short leg cast.  I did speak to her daughter and let her know that if we did consider surgery we would wait at least another week.  If she is discharged on Monday short-term skilled nursing, I will arrange for close follow-up in our office on Wednesday or Thursday of next week to change her into a cast.  I communicated this with the daughter as well.

## 2023-01-12 ENCOUNTER — Inpatient Hospital Stay (HOSPITAL_COMMUNITY): Payer: Medicare Other

## 2023-01-12 DIAGNOSIS — S82892A Other fracture of left lower leg, initial encounter for closed fracture: Secondary | ICD-10-CM | POA: Diagnosis not present

## 2023-01-12 LAB — BASIC METABOLIC PANEL
Anion gap: 10 (ref 5–15)
BUN: 46 mg/dL — ABNORMAL HIGH (ref 8–23)
CO2: 36 mmol/L — ABNORMAL HIGH (ref 22–32)
Calcium: 8.8 mg/dL — ABNORMAL LOW (ref 8.9–10.3)
Chloride: 94 mmol/L — ABNORMAL LOW (ref 98–111)
Creatinine, Ser: 1.84 mg/dL — ABNORMAL HIGH (ref 0.44–1.00)
GFR, Estimated: 27 mL/min — ABNORMAL LOW (ref >60)
Glucose, Bld: 122 mg/dL — ABNORMAL HIGH (ref 70–99)
Potassium: 4.3 mmol/L (ref 3.5–5.1)
Sodium: 140 mmol/L (ref 135–145)

## 2023-01-12 MED ORDER — SODIUM CHLORIDE 0.9 % IV SOLN: INTRAVENOUS | Status: DC

## 2023-01-12 MED ORDER — ENSURE ENLIVE PO LIQD
237.0000 mL | Freq: Two times a day (BID) | ORAL | Status: DC
  Administered 2023-01-12 – 2023-01-14 (×5): 237 mL via ORAL

## 2023-01-12 NOTE — Progress Notes (Signed)
PROGRESS NOTE    Valerie Bowman  WUJ:[id number] DOB: August 16, 1935 DOA: 01/09/2023 PCP: Smiley Houseman, NP     Brief Narrative:  Valerie Bowman is a 87 y.o. female with medical history significant for COPD, chronic diastolic CHF, hypertension, GERD, hypothyroidism, who presents to the ED from assisted living facility after a mechanical fall.  Reportedly her legs gave out and she fell onto her left side twisting her left ankle.  Denies any loss of consciousness or head injury.  EMS was activated and she was brought into the ED for further evaluation.  Left ankle x-ray showing acute displaced distal fibula and medial malleoli fractures. Mild lateral subluxation of patella dome with respect to the distal tibia. Orthopedic surgery consulted by EDP.   New events last 24 hours / Subjective: No complaints this morning, no worsening pain  Assessment & Plan:   Principal Problem:   Closed left ankle fracture Active Problems:   Low back pain   Acquired hypothyroidism   Chronic diastolic heart failure (HCC)   OSA (obstructive sleep apnea)   Chronic cough   Closed left ankle fracture -Orthopedic surgery following.  Due to swelling, recommend to continue splint and elevation and reevaluate in 3 to 4 weeks and potentially convert to a short leg cast.  Possible consideration for surgery in the short-term future.  If patient is discharged to SNF, Dr. Magnus Ivan will follow-up in office middle of next week -Need SNF placement  AKI on CKD stage IIIb -Baseline creatinine 1.2 -Renal ultrasound -Started on IV fluid today  Chronic diastolic CHF -Will hold HCTZ, Lasix in setting of creatinine trending up  Bibasilar atelectasis -Encourage deep breaths, incentive spirometry, flutter valve  Hypothyroidism -Synthroid  Vitamin B12 deficiency -Continue cyanocobalamin    DVT prophylaxis:  enoxaparin (LOVENOX) injection 40 mg Start: 01/10/23 1000  Code Status: DNR, no CPR, DNI Family Communication: Daughter  at bedside 11/2 Disposition Plan: SNF Status is: Inpatient Remains inpatient appropriate because: Placement pending, IV fluid today    Antimicrobials:  Anti-infectives (From admission, onward)    None        Objective: Vitals:   01/11/23 2010 01/11/23 2059 01/12/23 0905 01/12/23 1142  BP: (!) 93/38   (!) 103/45  Pulse: 60   69  Resp: 16   20  Temp: 97.9 F (36.6 C)   98.3 F (36.8 C)  TempSrc: Axillary     SpO2: 95% 96% 94% (!) 81%  Weight:      Height:        Intake/Output Summary (Last 24 hours) at 01/12/2023 1331 Last data filed at 01/12/2023 0930 Gross per 24 hour  Intake 720 ml  Output 300 ml  Net 420 ml   Filed Weights   01/10/23 0142 01/10/23 0607  Weight: 115 kg 117.2 kg    Examination:  General exam: Appears calm and comfortable  Respiratory system: CTAB anteriorly, without respiratory distress  Cardiovascular system: S1 & S2 heard, RRR. No murmurs.  Gastrointestinal system: Abdomen is nondistended, soft and nontender. Normal bowel sounds heard. Central nervous system: Alert  Extremities: Left lower extremity in splint Skin: No rashes, lesions or ulcers on exposed skin   Data Reviewed: I have personally reviewed following labs and imaging studies  CBC: Recent Labs  Lab 01/09/23 2333 01/10/23 0445 01/11/23 0701  WBC 9.2 8.2 8.3  NEUTROABS 7.2  --   --   HGB 9.7* 9.1* 9.7*  HCT 31.7* 30.2* 31.7*  MCV 95.5 96.5 96.1  PLT 202 201 190  Basic Metabolic Panel: Recent Labs  Lab 01/09/23 2333 01/10/23 0445 01/11/23 0701 01/12/23 0328  NA 143 140 139 140  K 3.5 3.2* 3.9 4.3  CL 95* 93* 92* 94*  CO2 38* 34* 37* 36*  GLUCOSE 126* 107* 114* 122*  BUN 35* 34* 38* 46*  CREATININE 1.22* 1.20* 1.47* 1.84*  CALCIUM 9.1 8.9 9.1 8.8*  MG  --  2.1 2.0  --   PHOS  --  3.8  --   --    GFR: Estimated Creatinine Clearance: 29.1 mL/min (A) (by C-G formula based on SCr of 1.84 mg/dL (H)). Liver Function Tests: No results for input(s): "AST", "ALT",  "ALKPHOS", "BILITOT", "PROT", "ALBUMIN" in the last 168 hours. No results for input(s): "LIPASE", "AMYLASE" in the last 168 hours. No results for input(s): "AMMONIA" in the last 168 hours. Coagulation Profile: No results for input(s): "INR", "PROTIME" in the last 168 hours. Cardiac Enzymes: No results for input(s): "CKTOTAL", "CKMB", "CKMBINDEX", "TROPONINI" in the last 168 hours. BNP (last 3 results) No results for input(s): "PROBNP" in the last 8760 hours. HbA1C: No results for input(s): "HGBA1C" in the last 72 hours. CBG: No results for input(s): "GLUCAP" in the last 168 hours. Lipid Profile: No results for input(s): "CHOL", "HDL", "LDLCALC", "TRIG", "CHOLHDL", "LDLDIRECT" in the last 72 hours. Thyroid Function Tests: No results for input(s): "TSH", "T4TOTAL", "FREET4", "T3FREE", "THYROIDAB" in the last 72 hours. Anemia Panel: No results for input(s): "VITAMINB12", "FOLATE", "FERRITIN", "TIBC", "IRON", "RETICCTPCT" in the last 72 hours. Sepsis Labs: No results for input(s): "PROCALCITON", "LATICACIDVEN" in the last 168 hours.  Recent Results (from the past 240 hour(s))  Resp panel by RT-PCR (RSV, Flu A&B, Covid) Anterior Nasal Swab     Status: None   Collection Time: 01/09/23 11:20 PM   Specimen: Anterior Nasal Swab  Result Value Ref Range Status   SARS Coronavirus 2 by RT PCR NEGATIVE NEGATIVE Final    Comment: (NOTE) SARS-CoV-2 target nucleic acids are NOT DETECTED.  The SARS-CoV-2 RNA is generally detectable in upper respiratory specimens during the acute phase of infection. The lowest concentration of SARS-CoV-2 viral copies this assay can detect is 138 copies/mL. A negative result does not preclude SARS-Cov-2 infection and should not be used as the sole basis for treatment or other patient management decisions. A negative result may occur with  improper specimen collection/handling, submission of specimen other than nasopharyngeal swab, presence of viral mutation(s)  within the areas targeted by this assay, and inadequate number of viral copies(<138 copies/mL). A negative result must be combined with clinical observations, patient history, and epidemiological information. The expected result is Negative.  Fact Sheet for Patients:  BloggerCourse.com  Fact Sheet for Healthcare Providers:  SeriousBroker.it  This test is no t yet approved or cleared by the [place] FDA and  has been authorized for detection and/or diagnosis of SARS-CoV-2 by FDA under an Emergency Use Authorization (EUA). This EUA will remain  in effect (meaning this test can be used) for the duration of the COVID-19 declaration under Section 564(b)(1) of the Act, 21 U.S.C.section 360bbb-3(b)(1), unless the authorization is terminated  or revoked sooner.       Influenza A by PCR NEGATIVE NEGATIVE Final   Influenza B by PCR NEGATIVE NEGATIVE Final    Comment: (NOTE) The Xpert Xpress SARS-CoV-2/FLU/RSV plus assay is intended as an aid in the diagnosis of influenza from Nasopharyngeal swab specimens and should not be used as a sole basis for treatment. Nasal washings and aspirates are  unacceptable for Xpert Xpress SARS-CoV-2/FLU/RSV testing.  Fact Sheet for Patients: BloggerCourse.com  Fact Sheet for Healthcare Providers: SeriousBroker.it  This test is not yet approved or cleared by the [place] FDA and has been authorized for detection and/or diagnosis of SARS-CoV-2 by FDA under an Emergency Use Authorization (EUA). This EUA will remain in effect (meaning this test can be used) for the duration of the COVID-19 declaration under Section 564(b)(1) of the Act, 21 U.S.C. section 360bbb-3(b)(1), unless the authorization is terminated or revoked.     Resp Syncytial Virus by PCR NEGATIVE NEGATIVE Final    Comment: (NOTE) Fact Sheet for  Patients: BloggerCourse.com  Fact Sheet for Healthcare Providers: SeriousBroker.it  This test is not yet approved or cleared by the [place] FDA and has been authorized for detection and/or diagnosis of SARS-CoV-2 by FDA under an Emergency Use Authorization (EUA). This EUA will remain in effect (meaning this test can be used) for the duration of the COVID-19 declaration under Section 564(b)(1) of the Act, 21 U.S.C. section 360bbb-3(b)(1), unless the authorization is terminated or revoked.  Performed at [facility], [address], [place], [place] [address]       Radiology Studies: US RENAL  Result Date: 01/12/2023 CLINICAL DATA:  Acute kidney insufficiency EXAM: RENAL / URINARY TRACT ULTRASOUND COMPLETE COMPARISON:  None Available. FINDINGS: Right Kidney: Renal measurements: 8.2 x 4.2 x 3.8 cm = volume: 68.3 mL. There is some parenchymal atrophy of the right kidney. No collecting system dilatation or perinephric fluid. Left Kidney: Renal measurements: 9.0 x 5.7 x 5.5 cm = volume: 146 mL. Echogenicity within normal limits. No mass or hydronephrosis visualized. Bladder: Underdistended. Other: Limited by overlapping bowel gas and soft tissue. IMPRESSION: Right-sided renal atrophy.  No collecting system dilatation. Electronically Signed   By: Karen Kays M.D.   On: 01/12/2023 12:58      Scheduled Meds:  albuterol  2.5 mg Nebulization BID   aspirin EC  81 mg Oral Daily   cyanocobalamin  500 mcg Oral Daily   enoxaparin (LOVENOX) injection  40 mg Subcutaneous Q24H   fluticasone  1 spray Each Nare Daily   fluticasone furoate-vilanterol  1 puff Inhalation Daily   And   umeclidinium bromide  1 puff Inhalation Daily   levothyroxine  175 mcg Oral BID   pantoprazole  40 mg Oral BID AC   senna  1 tablet Oral BID   Continuous Infusions:  sodium chloride 75 mL/hr at 01/12/23 0855     LOS: 2 days   Time spent: 25  minutes   Noralee Stain, DO [facility] 01/12/2023, 1:31 PM   Available via Epic secure chat 7am-7pm After these hours, please refer to coverage provider listed on [email or web address]

## 2023-01-12 NOTE — Plan of Care (Signed)
  Problem: Education: Goal: Knowledge of General Education information will improve Description: Including pain rating scale, medication(s)/side effects and non-pharmacologic comfort measures Outcome: Progressing   Problem: Health Behavior/Discharge Planning: Goal: Ability to manage health-related needs will improve Outcome: Progressing   Problem: Clinical Measurements: Goal: Will remain free from infection Outcome: Progressing   Problem: Nutrition: Goal: Adequate nutrition will be maintained Outcome: Progressing   Problem: Coping: Goal: Level of anxiety will decrease Outcome: Progressing   Problem: Safety: Goal: Ability to remain free from injury will improve Outcome: Progressing   Problem: Skin Integrity: Goal: Risk for impaired skin integrity will decrease Outcome: Progressing

## 2023-01-12 NOTE — Progress Notes (Signed)
   01/12/23 2221  BiPAP/CPAP/SIPAP  BiPAP/CPAP/SIPAP Pt Type Adult  BiPAP/CPAP/SIPAP  (triliogly)  Mask Type Nasal pillows  Set Rate 20 breaths/min  IPAP  (5/26)  EPAP  (4/15)  Tidal Volume (Vt) 300  Patient Home Equipment Yes

## 2023-01-13 DIAGNOSIS — S82892D Other fracture of left lower leg, subsequent encounter for closed fracture with routine healing: Secondary | ICD-10-CM

## 2023-01-13 LAB — CBC
HCT: 27.2 % — ABNORMAL LOW (ref 36.0–46.0)
Hemoglobin: 8.5 g/dL — ABNORMAL LOW (ref 12.0–15.0)
MCH: 29.7 pg (ref 26.0–34.0)
MCHC: 31.3 g/dL (ref 30.0–36.0)
MCV: 95.1 fL (ref 80.0–100.0)
Platelets: 163 10*3/uL (ref 150–400)
RBC: 2.86 MIL/uL — ABNORMAL LOW (ref 3.87–5.11)
RDW: 15.8 % — ABNORMAL HIGH (ref 11.5–15.5)
WBC: 7.4 10*3/uL (ref 4.0–10.5)
nRBC: 0.3 % — ABNORMAL HIGH (ref 0.0–0.2)

## 2023-01-13 LAB — BASIC METABOLIC PANEL
Anion gap: 9 (ref 5–15)
BUN: 35 mg/dL — ABNORMAL HIGH (ref 8–23)
CO2: 36 mmol/L — ABNORMAL HIGH (ref 22–32)
Calcium: 8.7 mg/dL — ABNORMAL LOW (ref 8.9–10.3)
Chloride: 94 mmol/L — ABNORMAL LOW (ref 98–111)
Creatinine, Ser: 1.16 mg/dL — ABNORMAL HIGH (ref 0.44–1.00)
GFR, Estimated: 46 mL/min — ABNORMAL LOW (ref >60)
Glucose, Bld: 112 mg/dL — ABNORMAL HIGH (ref 70–99)
Potassium: 3.9 mmol/L (ref 3.5–5.1)
Sodium: 139 mmol/L (ref 135–145)

## 2023-01-13 MED ORDER — TRAMADOL HCL 50 MG PO TABS
50.0000 mg | ORAL_TABLET | Freq: Once | ORAL | Status: AC
  Administered 2023-01-13: 50 mg via ORAL
  Filled 2023-01-13: qty 1

## 2023-01-13 NOTE — Progress Notes (Signed)
Patient ID: Valerie Bowman, female   DOB: 1935-10-03, 87 y.o.   MRN: [id number] We were able to come by the bedside today and take down the splint on the patient's left ankle.  Fortunately her skin is intact with no soft tissue compromise.  We then were able to place her in a well-padded short leg cast and I believe improved alignment of the ankle.  Her daughters at the bedside as well.  The patient tolerated [name] putting her into a cast on her left ankle.  This is an unstable fracture but hopefully with good alignment can heal appropriately enough to allow for eventual full weightbearing.  We did explain this to the patient and her daughter as well.  The plan for now is nonweightbearing on her left ankle for the next 4 to 6 weeks.  We do need to see her in the office midweek next week to obtain new x-rays of her left ankle and then go from there in terms of further recommendations.

## 2023-01-13 NOTE — Plan of Care (Signed)

## 2023-01-13 NOTE — Discharge Instructions (Signed)
The patient needs to be complete nonweightbearing on her left lower extremity until further notice. Nonweightbearing will be likely 4 to 6 weeks on the left ankle. The current cast should be kept clean and dry.

## 2023-01-13 NOTE — Progress Notes (Signed)
   01/13/23 2300  BiPAP/CPAP/SIPAP  BiPAP/CPAP/SIPAP Pt Type Adult  BiPAP/CPAP/SIPAP Resmed  Mask Type Nasal pillows  Mask Size Medium  Respiratory Rate 22 breaths/min  IPAP  (5/26)  FiO2 (%) 28 %  Tidal Volume (Vt) 300  Patient Home Equipment Yes  Safety Check Completed by RT for Home Unit Yes, no issues noted

## 2023-01-13 NOTE — Hospital Course (Addendum)
  Brief Narrative:  Valerie Bowman is a 87 y.o. female with medical history significant for COPD, chronic diastolic CHF, hypertension, GERD, hypothyroidism, who presents to the ED from assisted living facility after a mechanical fall.  Reportedly her legs gave out and she fell onto her left side twisting her left ankle.  Denies any loss of consciousness or head injury.  EMS was activated and she was brought into the ED for further evaluation.  Left ankle x-ray showing acute displaced distal fibula and medial malleoli fractures. Mild lateral subluxation of patella dome with respect to the distal tibia. Orthopedic surgery consulted by EDP.  Orthopedic recommending conservative management for the next few days until swelling gets better.  PT recommending SNF.  Patient will follow-up eventually with orthopedic for possible surgery in the future. For now we will discontinue hydrochlorothiazide and Lasix.  This can be resumed or given outpatient as needed.    Assessment & Plan:   Principal Problem:   Closed left ankle fracture Active Problems:   Low back pain   Acquired hypothyroidism   Chronic diastolic heart failure (HCC)   OSA (obstructive sleep apnea)   Chronic cough     Closed left ankle fracture Seen by orthopedic, currently in splint with elevation. short leg cast today.  Surgical options will be discussed during her future visits.  For now PT recommending SNF.  Okay to discharge to SNF with outpatient follow-up   AKI on CKD stage IIIb; resolved.  Creatinine stable at baseline of 1.1.  Peaked at 1.84.  Discharge creatinine back to 1.16.  Chronic diastolic CHF Holding Lasix and HCTZ.  Creatinine has slowly stabilized.   Bibasilar atelectasis -Encourage deep breaths, incentive spirometry, flutter valve   Hypothyroidism -Synthroid   Vitamin B12 deficiency -Continue cyanocobalamin   PT/OT-SNF   DVT prophylaxis: Lovenox Code Status: DNR, no CPR, DNI Family Communication: Daughter at  bedside Disposition Plan: SNF Status is: Inpatient Remains inpatient appropriate because: SNF placement.    Subjective: No complaints doing okay.   Examination:  General exam: Appears calm and comfortable  Respiratory system: CTAB anteriorly, without respiratory distress  Cardiovascular system: S1 & S2 heard, RRR. No murmurs.  Gastrointestinal system: Abdomen is nondistended, soft and nontender. Normal bowel sounds heard. Central nervous system: Alert  Extremities: Left lower extremity in splint Skin: No rashes, lesions or ulcers on exposed skin

## 2023-01-13 NOTE — TOC Progression Note (Signed)
Transition of Care [facility]) - Progression Note    Patient Details  Name: Valerie Bowman MRN: [id number] Date of Birth: 04-Feb-1936  Transition of Care [facility]) CM/SW Contact  Darleene Cleaver, [place] Phone Number: 01/13/2023, 3:43 PM  Clinical Narrative:     CSW was informed that patient's daughter would like to discuss SNF placements.  CSW informed her that the follow facilities offered a bed for patient:  [facility] SN[facility]   [facility]   [facility]   [facility]   [facility]   [facility]   [facility]  SNF   [facility]   [facility]   [facility]   [facility]   [facility]    Patient's daughter reviewed the options with patient and they chose [facility].  CSW called [facility] and they informed CSW that there is a bed available however it is a semiprivate room.  CSW shared this information with the daughter, and she discussed it with the patient and agreed to it. CSW explained what to expect at SNF and the process to get patient to the facility.  Patient normally lives at [facility], and plans to return once she has finished with her rehab.  CSW explained to the daughter how insurance will pay for the stay, what to expect at SNF, and how patient will be transported to the facility.  Patient's daughter expressed understanding and was appreciative of information given.  CSW updated attending physician that bed is available once she is medically ready for discharge.   Expected Discharge Plan: Skilled Nursing Facility Barriers to Discharge: Continued Medical Work up, SNF Pending bed offer  Expected Discharge Plan and Services In-house Referral: Clinical Social Work   Post Acute Care Choice: Skilled Nursing Facility Living arrangements for the past 2 months: Assisted Living Facility                                        Social Determinants of Health (SDOH) Interventions SDOH Screenings   Food Insecurity: No Food Insecurity (01/10/2023)  Housing: Low Risk  (01/10/2023)  Transportation Needs: No Transportation Needs (01/10/2023)  Utilities: Not At Risk (01/10/2023)  Financial Resource Strain: Low Risk  (09/10/2020)   Received from [facility]  Physical Activity: Inactive (09/10/2020)   Received from [facility]  Social Connections: Moderately Isolated (09/10/2020)   Received from [facility]  Stress: No Stress Concern Present (09/10/2020)   Received from [facility]  Tobacco Use: Medium Risk (01/09/2023)    Readmission Risk Interventions     No data to display

## 2023-01-13 NOTE — Progress Notes (Signed)
Physical Therapy Treatment Patient Details Name: Valerie Bowman MRN: [id number] DOB: 06-14-1935 Today's Date: 01/13/2023   History of Present Illness 87 yo female presents to therapy following hospital admission on 01/09/2023 due to mechanical fall at [facility]. Fall resulted in L ankle trimalleolar fx, orthopedics recommends conservative measures at this time due to extensive edema. Pt is L LE NWB. Pt PMH includes but is not limited to: falls, hypothyroidism, COPD, HTN, chronic HFpEF and GERD.    PT Comments    Pt admitted with above diagnosis.  Pt currently with functional limitations due to the deficits listed below (see PT Problem List). Pt in bed when PT and rehab tech arrived, daughter remained during therapy session. Pt required max A x 2 for supine <> sit, total A to reposition in bed, max A x 2 for 2 standing trials from elevated EOB with pull to stand to RW and extensive multimodal cues for L LE NWB. Pt left in bed all needs in place. D/c plan remains appropriate with pt to transition to SNF for short term rehab services.  Pt will benefit from acute skilled PT to increase their independence and safety with mobility to allow discharge.      If plan is discharge home, recommend the following: Two people to help with walking and/or transfers;Two people to help with bathing/dressing/bathroom;Assistance with cooking/housework;Assistance with feeding;Assist for transportation;Direct supervision/assist for financial management;Direct supervision/assist for medications management;Help with stairs or ramp for entrance;Supervision due to cognitive status   Can travel by private vehicle        Equipment Recommendations  None recommended by PT    Recommendations for Other Services       Precautions / Restrictions Precautions Precautions: Fall Required Braces or Orthoses: Splint/Cast Restrictions Weight Bearing Restrictions: Yes LLE Weight Bearing: Non weight bearing     Mobility   Bed Mobility Overal bed mobility: Needs Assistance Bed Mobility: Rolling, Sidelying to Sit, Sit to Supine Rolling: Max assist, Used rails Sidelying to sit: Used rails, HOB elevated, Max assist Supine to sit: Max assist, +2 for physical assistance, +2 for safety/equipment, Used rails Sit to supine: Total assist, +2 for physical assistance   General bed mobility comments: pt required cues and extensive assist for all bed mobility tasks, pt demonstrated improved abiltiy to participate    Transfers Overall transfer level: Needs assistance Equipment used: Rolling walker (2 wheels) Transfers: Sit to/from Stand Sit to Stand: From elevated surface, +2 physical assistance, +2 safety/equipment, Max assist           General transfer comment: pt required extensive assist for pull to stand at [facility] with first standing trial pt unable to obtain extension posture second trial PT and rehab tech able to assist pt to upright standing, breif <8 s with pt reporting need to sit, PT and rehab tech assisted pt scoot laterally to R in sitting with total A, PT maintained pts foot on therapist thoughout sit to stand and scoot to provide insight and feedback on maintaining  L LE NWB status.    Ambulation/Gait               General Gait Details: NT   Stairs             Wheelchair Mobility     Tilt Bed    Modified Rankin (Stroke Patients Only)       Balance Overall balance assessment: Needs assistance, History of Falls Sitting-balance support: Feet supported, Bilateral upper extremity supported, Single extremity supported Sitting  balance-Leahy Scale: Fair Sitting balance - Comments: pt able to maintain static sitting EOB with B UE support and B LE on floor with close S and min cues   Standing balance support: Bilateral upper extremity supported, During functional activity, Reliant on assistive device for balance Standing balance-Leahy Scale: Zero                               Cognition Arousal: Suspect due to medications, Alert Behavior During Therapy: WFL for tasks assessed/performed Overall Cognitive Status: Within Functional Limits for tasks assessed                         Following Commands: Follows one step commands consistently Safety/Judgement: Decreased awareness of deficits              Exercises      General Comments General comments (skin integrity, edema, etc.): 2 L/min 93% with exertion      Pertinent Vitals/Pain Pain Assessment Pain Assessment: Faces Faces Pain Scale: Hurts a little bit Pain Location: L LE "top of my leg" Pain Descriptors / Indicators: Aching, Discomfort, Guarding Pain Intervention(s): Limited activity within patient's tolerance, Monitored during session, Repositioned    Home Living                          Prior Function            PT Goals (current goals can now be found in the care plan section) Acute Rehab PT Goals Patient Stated Goal: to go back to ALF and not do rehab PT Goal Formulation: With patient Time For Goal Achievement: 01/24/23 Potential to Achieve Goals: Fair Progress towards PT goals: Progressing toward goals    Frequency    Min 1X/week      PT Plan      Co-evaluation              AM-PAC PT "6 Clicks" Mobility   Outcome Measure  Help needed turning from your back to your side while in a flat bed without using bedrails?: A Lot Help needed moving from lying on your back to sitting on the side of a flat bed without using bedrails?: Total Help needed moving to and from a bed to a chair (including a wheelchair)?: Total Help needed standing up from a chair using your arms (e.g., wheelchair or bedside chair)?: Total Help needed to walk in hospital room?: Total Help needed climbing 3-5 steps with a railing? : Total 6 Click Score: 7    End of Session Equipment Utilized During Treatment: Oxygen Activity Tolerance: Patient limited by fatigue Patient left:  in bed;with call bell/phone within reach;with bed alarm set;with family/visitor present Nurse Communication: Mobility status;Need for lift equipment;Weight bearing status PT Visit Diagnosis: Unsteadiness on feet (R26.81);Other abnormalities of gait and mobility (R26.89);Muscle weakness (generalized) (M62.81);History of falling (Z91.81);Difficulty in walking, not elsewhere classified (R26.2);Pain Pain - Right/Left: Left Pain - part of body: Leg;Ankle and joints of foot     Time: 1610-9604 PT Time Calculation (min) (ACUTE ONLY): 23 min  Charges:    $Therapeutic Activity: 23-37 mins PT General Charges $$ ACUTE PT VISIT: 1 Visit                     Valerie Bowman, PT Acute Rehab    Valerie Bowman 01/13/2023, 12:19 PM

## 2023-01-13 NOTE — Progress Notes (Signed)
PROGRESS NOTE    Valerie Bowman  QQV:[id number] DOB: Dec 28, 1935 DOA: 01/09/2023 PCP: Smiley Houseman, NP     Brief Narrative:  Valerie Bowman is a 87 y.o. female with medical history significant for COPD, chronic diastolic CHF, hypertension, GERD, hypothyroidism, who presents to the ED from assisted living facility after a mechanical fall.  Reportedly her legs gave out and she fell onto her left side twisting her left ankle.  Denies any loss of consciousness or head injury.  EMS was activated and she was brought into the ED for further evaluation.  Left ankle x-ray showing acute displaced distal fibula and medial malleoli fractures. Mild lateral subluxation of patella dome with respect to the distal tibia. Orthopedic surgery consulted by EDP.  Orthopedic recommending conservative management for the next few days until swelling gets better.  PT recommending SNF.  Patient will follow-up eventually with orthopedic for possible surgery in the future.      Assessment & Plan:   Principal Problem:   Closed left ankle fracture Active Problems:   Low back pain   Acquired hypothyroidism   Chronic diastolic heart failure (HCC)   OSA (obstructive sleep apnea)   Chronic cough     Closed left ankle fracture Seen by orthopedic, currently in splint with elevation. short leg cast today.  Surgical options will be discussed during her future visits.  For now PT recommending SNF.  Okay to discharge to SNF with outpatient follow-up   AKI on CKD stage IIIb Creatinine stable at baseline of 1.1.  Peaked at 1.84.  Chronic diastolic CHF Holding Lasix and HCTZ.  Creatinine has slowly stabilized.   Bibasilar atelectasis -Encourage deep breaths, incentive spirometry, flutter valve   Hypothyroidism -Synthroid   Vitamin B12 deficiency -Continue cyanocobalamin   PT/OT-SNF   DVT prophylaxis: Lovenox Code Status: DNR, no CPR, DNI Family Communication: Daughter at bedside Disposition Plan: SNF Status is:  Inpatient Remains inpatient appropriate because: Placement pending, IV fluid today   Subjective: No new complaints. Daughter at bedside.    Examination:  General exam: Appears calm and comfortable  Respiratory system: CTAB anteriorly, without respiratory distress  Cardiovascular system: S1 & S2 heard, RRR. No murmurs.  Gastrointestinal system: Abdomen is nondistended, soft and nontender. Normal bowel sounds heard. Central nervous system: Alert  Extremities: Left lower extremity in splint Skin: No rashes, lesions or ulcers on exposed skin           Diet Orders (From admission, onward)     Start     Ordered   01/10/23 0145  Diet Heart Room service appropriate? Yes; Fluid consistency: Thin  Diet effective now       Question Answer Comment  Room service appropriate? Yes   Fluid consistency: Thin      01/10/23 0144            Objective: Vitals:   01/12/23 1948 01/13/23 0520 01/13/23 0750 01/13/23 0812  BP:  (!) 103/47 (!) 134/55   Pulse:  65 72   Resp:  19 18   Temp:  98 F (36.7 C) 97.8 F (36.6 C)   TempSrc:  Oral Oral   SpO2: 98% 91% 91% 94%  Weight:      Height:        Intake/Output Summary (Last 24 hours) at 01/13/2023 1308 Last data filed at 01/13/2023 0530 Gross per 24 hour  Intake 240 ml  Output 775 ml  Net -535 ml   Filed Weights   01/10/23 0142 01/10/23 0607  Weight:  115 kg 117.2 kg    Scheduled Meds:  albuterol  2.5 mg Nebulization BID   aspirin EC  81 mg Oral Daily   cyanocobalamin  500 mcg Oral Daily   enoxaparin (LOVENOX) injection  40 mg Subcutaneous Q24H   feeding supplement  237 mL Oral BID BM   fluticasone  1 spray Each Nare Daily   fluticasone furoate-vilanterol  1 puff Inhalation Daily   And   umeclidinium bromide  1 puff Inhalation Daily   levothyroxine  175 mcg Oral BID   pantoprazole  40 mg Oral BID AC   senna  1 tablet Oral BID   Continuous Infusions:  sodium chloride 75 mL/hr at 01/13/23 1200    Nutritional status      Body mass index is 41.7 kg/m.  Data Reviewed:   CBC: Recent Labs  Lab 01/09/23 2333 01/10/23 0445 01/11/23 0701 01/13/23 0319  WBC 9.2 8.2 8.3 7.4  NEUTROABS 7.2  --   --   --   HGB 9.7* 9.1* 9.7* 8.5*  HCT 31.7* 30.2* 31.7* 27.2*  MCV 95.5 96.5 96.1 95.1  PLT 202 201 190 163   Basic Metabolic Panel: Recent Labs  Lab 01/09/23 2333 01/10/23 0445 01/11/23 0701 01/12/23 0328 01/13/23 0319  NA 143 140 139 140 139  K 3.5 3.2* 3.9 4.3 3.9  CL 95* 93* 92* 94* 94*  CO2 38* 34* 37* 36* 36*  GLUCOSE 126* 107* 114* 122* 112*  BUN 35* 34* 38* 46* 35*  CREATININE 1.22* 1.20* 1.47* 1.84* 1.16*  CALCIUM 9.1 8.9 9.1 8.8* 8.7*  MG  --  2.1 2.0  --   --   PHOS  --  3.8  --   --   --    GFR: Estimated Creatinine Clearance: 46.2 mL/min (A) (by C-G formula based on SCr of 1.16 mg/dL (H)). Liver Function Tests: No results for input(s): "AST", "ALT", "ALKPHOS", "BILITOT", "PROT", "ALBUMIN" in the last 168 hours. No results for input(s): "LIPASE", "AMYLASE" in the last 168 hours. No results for input(s): "AMMONIA" in the last 168 hours. Coagulation Profile: No results for input(s): "INR", "PROTIME" in the last 168 hours. Cardiac Enzymes: No results for input(s): "CKTOTAL", "CKMB", "CKMBINDEX", "TROPONINI" in the last 168 hours. BNP (last 3 results) No results for input(s): "PROBNP" in the last 8760 hours. HbA1C: No results for input(s): "HGBA1C" in the last 72 hours. CBG: No results for input(s): "GLUCAP" in the last 168 hours. Lipid Profile: No results for input(s): "CHOL", "HDL", "LDLCALC", "TRIG", "CHOLHDL", "LDLDIRECT" in the last 72 hours. Thyroid Function Tests: No results for input(s): "TSH", "T4TOTAL", "FREET4", "T3FREE", "THYROIDAB" in the last 72 hours. Anemia Panel: No results for input(s): "VITAMINB12", "FOLATE", "FERRITIN", "TIBC", "IRON", "RETICCTPCT" in the last 72 hours. Sepsis Labs: No results for input(s): "PROCALCITON", "LATICACIDVEN" in the last 168  hours.  Recent Results (from the past 240 hour(s))  Resp panel by RT-PCR (RSV, Flu A&B, Covid) Anterior Nasal Swab     Status: None   Collection Time: 01/09/23 11:20 PM   Specimen: Anterior Nasal Swab  Result Value Ref Range Status   SARS Coronavirus 2 by RT PCR NEGATIVE NEGATIVE Final    Comment: (NOTE) SARS-CoV-2 target nucleic acids are NOT DETECTED.  The SARS-CoV-2 RNA is generally detectable in upper respiratory specimens during the acute phase of infection. The lowest concentration of SARS-CoV-2 viral copies this assay can detect is 138 copies/mL. A negative result does not preclude SARS-Cov-2 infection and should not be used as the  sole basis for treatment or other patient management decisions. A negative result may occur with  improper specimen collection/handling, submission of specimen other than nasopharyngeal swab, presence of viral mutation(s) within the areas targeted by this assay, and inadequate number of viral copies(<138 copies/mL). A negative result must be combined with clinical observations, patient history, and epidemiological information. The expected result is Negative.  Fact Sheet for Patients:  BloggerCourse.com  Fact Sheet for Healthcare Providers:  SeriousBroker.it  This test is no t yet approved or cleared by the [place] FDA and  has been authorized for detection and/or diagnosis of SARS-CoV-2 by FDA under an Emergency Use Authorization (EUA). This EUA will remain  in effect (meaning this test can be used) for the duration of the COVID-19 declaration under Section 564(b)(1) of the Act, 21 U.S.C.section 360bbb-3(b)(1), unless the authorization is terminated  or revoked sooner.       Influenza A by PCR NEGATIVE NEGATIVE Final   Influenza B by PCR NEGATIVE NEGATIVE Final    Comment: (NOTE) The Xpert Xpress SARS-CoV-2/FLU/RSV plus assay is intended as an aid in the diagnosis of influenza from  Nasopharyngeal swab specimens and should not be used as a sole basis for treatment. Nasal washings and aspirates are unacceptable for Xpert Xpress SARS-CoV-2/FLU/RSV testing.  Fact Sheet for Patients: BloggerCourse.com  Fact Sheet for Healthcare Providers: SeriousBroker.it  This test is not yet approved or cleared by the [place] FDA and has been authorized for detection and/or diagnosis of SARS-CoV-2 by FDA under an Emergency Use Authorization (EUA). This EUA will remain in effect (meaning this test can be used) for the duration of the COVID-19 declaration under Section 564(b)(1) of the Act, 21 U.S.C. section 360bbb-3(b)(1), unless the authorization is terminated or revoked.     Resp Syncytial Virus by PCR NEGATIVE NEGATIVE Final    Comment: (NOTE) Fact Sheet for Patients: BloggerCourse.com  Fact Sheet for Healthcare Providers: SeriousBroker.it  This test is not yet approved or cleared by the [place] FDA and has been authorized for detection and/or diagnosis of SARS-CoV-2 by FDA under an Emergency Use Authorization (EUA). This EUA will remain in effect (meaning this test can be used) for the duration of the COVID-19 declaration under Section 564(b)(1) of the Act, 21 U.S.C. section 360bbb-3(b)(1), unless the authorization is terminated or revoked.  Performed at [facility], [address], [place], [place] [address]          Radiology Studies: US RENAL  Result Date: 01/12/2023 CLINICAL DATA:  Acute kidney insufficiency EXAM: RENAL / URINARY TRACT ULTRASOUND COMPLETE COMPARISON:  None Available. FINDINGS: Right Kidney: Renal measurements: 8.2 x 4.2 x 3.8 cm = volume: 68.3 mL. There is some parenchymal atrophy of the right kidney. No collecting system dilatation or perinephric fluid. Left Kidney: Renal measurements: 9.0 x 5.7 x 5.5 cm = volume:  146 mL. Echogenicity within normal limits. No mass or hydronephrosis visualized. Bladder: Underdistended. Other: Limited by overlapping bowel gas and soft tissue. IMPRESSION: Right-sided renal atrophy.  No collecting system dilatation. Electronically Signed   By: Karen Kays M.D.   On: 01/12/2023 12:58           LOS: 3 days   Time spent= 35 mins    Miguel Rota, MD [facility]  If 7PM-7AM, please contact night-coverage  01/13/2023, 1:08 PM

## 2023-01-14 ENCOUNTER — Telehealth: Payer: Self-pay | Admitting: Physical Medicine and Rehabilitation

## 2023-01-14 DIAGNOSIS — S82892D Other fracture of left lower leg, subsequent encounter for closed fracture with routine healing: Secondary | ICD-10-CM | POA: Diagnosis not present

## 2023-01-14 MED ORDER — OXYCODONE HCL 5 MG PO TABS: 5.0000 mg | ORAL_TABLET | Freq: Four times a day (QID) | ORAL | 0 refills | Status: DC | PRN

## 2023-01-14 NOTE — Telephone Encounter (Signed)
Spoke with patients daughter [name]) this morning on telephone, she informed me her mother is in hospital for left ankle fracture and has plans to be moved to rehab facility. I discussed recent cervical MRI imaging. She does have multilevel facet and foraminal narrowing. No high grade spinal canal stenosis noted. Could look at facet vs ESI. Best if she can come in office for re-evaluation as we have not seen her since March. Daughter will call her to schedule when ready.

## 2023-01-14 NOTE — TOC Transition Note (Signed)
Transition of Care [facility]) - CM/SW Discharge Note   Patient Details  Name: Valerie Bowman MRN: [id number] Date of Birth: Apr 25, 1935  [facility] CM/SW Contact:  Amada Jupiter, LCSW Phone Number: 01/14/2023, 2:37 PM   Clinical Narrative:    Pt medically cleared for dc today to [facility].  PT[facility] called. RN to call report to [phone].  No further TOC needs.   Final next level of care: Skilled Nursing Facility Barriers to Discharge: Barriers Resolved   Patient Goals and CMS Choice      Discharge Placement                Patient chooses bed at: [facility] Patient to be transferred to facility by: [facility] Name of family member notified: daughter Patient and family notified of of transfer: 01/14/23  Discharge Plan and Services Additional resources added to the After Visit Summary for   In-house Referral: Clinical Social Work   Post Acute Care Choice: Skilled Nursing Facility          DME Arranged: N/A DME Agency: NA                  Social Determinants of Health (SDOH) Interventions SDOH Screenings   Food Insecurity: No Food Insecurity (01/10/2023)  Housing: Low Risk  (01/10/2023)  Transportation Needs: No Transportation Needs (01/10/2023)  Utilities: Not At Risk (01/10/2023)  Financial Resource Strain: Low Risk  (09/10/2020)   [facility]  Physical Activity: Inactive (09/10/2020)   Received from [facility]  Social Connections: Moderately Isolated (09/10/2020)   Received from [facility]  Stress: No Stress Concern Present (09/10/2020)   Received from [facility]  Tobacco Use: Medium Risk (01/09/2023)     Readmission Risk Interventions     No data to display

## 2023-01-14 NOTE — Discharge Summary (Signed)
Physician Discharge Summary  Valerie Bowman WUJ:[id number] DOB: 04/09/1935 DOA: 01/09/2023  PCP: Smiley Houseman, NP  Admit date: 01/09/2023 Discharge date: 01/14/2023  Admitted From: Home Disposition: SNF  Recommendations for Outpatient Follow-up:  Follow up with PCP in 1-2 weeks Please obtain BMP/CBC in one week your next doctors visit.  Discontinue HCTZ and Lasix.  Outpatient this can be given as needed. Continue 2 L nasal cannula, adjust with physical therapy as needed.   Discharge Condition: Stable CODE STATUS: DNR Diet recommendation: Low-salt    Brief Narrative:  Valerie Bowman is a 87 y.o. female with medical history significant for COPD, chronic diastolic CHF, hypertension, GERD, hypothyroidism, who presents to the ED from assisted living facility after a mechanical fall.  Reportedly her legs gave out and she fell onto her left side twisting her left ankle.  Denies any loss of consciousness or head injury.  EMS was activated and she was brought into the ED for further evaluation.  Left ankle x-ray showing acute displaced distal fibula and medial malleoli fractures. Mild lateral subluxation of patella dome with respect to the distal tibia. Orthopedic surgery consulted by EDP.  Orthopedic recommending conservative management for the next few days until swelling gets better.  PT recommending SNF.  Patient will follow-up eventually with orthopedic for possible surgery in the future. For now we will discontinue hydrochlorothiazide and Lasix.  This can be resumed or given outpatient as needed.    Assessment & Plan:   Principal Problem:   Closed left ankle fracture Active Problems:   Low back pain   Acquired hypothyroidism   Chronic diastolic heart failure (HCC)   OSA (obstructive sleep apnea)   Chronic cough     Closed left ankle fracture Seen by orthopedic, currently in splint with elevation. short leg cast today.  Surgical options will be discussed during her future visits.  For  now PT recommending SNF.  Okay to discharge to SNF with outpatient follow-up   AKI on CKD stage IIIb; resolved.  Creatinine stable at baseline of 1.1.  Peaked at 1.84.  Discharge creatinine back to 1.16.  Chronic diastolic CHF Holding Lasix and HCTZ.  Creatinine has slowly stabilized.   Bibasilar atelectasis -Encourage deep breaths, incentive spirometry, flutter valve   Hypothyroidism -Synthroid   Vitamin B12 deficiency -Continue cyanocobalamin   PT/OT-SNF   DVT prophylaxis: Lovenox Code Status: DNR, no CPR, DNI Family Communication: Daughter at bedside Disposition Plan: SNF Status is: Inpatient Remains inpatient appropriate because: SNF placement.    Subjective: No complaints doing okay.   Examination:  General exam: Appears calm and comfortable  Respiratory system: CTAB anteriorly, without respiratory distress  Cardiovascular system: S1 & S2 heard, RRR. No murmurs.  Gastrointestinal system: Abdomen is nondistended, soft and nontender. Normal bowel sounds heard. Central nervous system: Alert  Extremities: Left lower extremity in splint Skin: No rashes, lesions or ulcers on exposed skin    Discharge Diagnoses:  Principal Problem:   Closed left ankle fracture Active Problems:   Low back pain   Acquired hypothyroidism   Chronic diastolic heart failure (HCC)   OSA (obstructive sleep apnea)   Chronic cough     Discharge Exam: Vitals:   01/14/23 0625 01/14/23 0900  BP: (!) 141/57   Pulse: 71   Resp: (!) 22   Temp: 97.7 F (36.5 C)   SpO2: 97% 96%   Vitals:   01/13/23 2031 01/13/23 2256 01/14/23 0625 01/14/23 0900  BP: (!) 129/49 (!) 109/54 (!) 141/57   Pulse: 80  72 71   Resp: (!) 22 (!) 22 (!) 22   Temp:  97.9 F (36.6 C) 97.7 F (36.5 C)   TempSrc:  Oral Oral   SpO2: 100% 98% 97% 96%  Weight:      Height:         Discharge Instructions   Allergies as of 01/14/2023       Reactions   Duloxetine Hcl Other (See Comments)   Oxybutynin Other  (See Comments)   Penicillins Other (See Comments)   Listed on MAR as allergy Unknown reaction   Sulfa Antibiotics Other (See Comments)   Listed on MAR as allergy Unknown reaction        Medication List     STOP taking these medications    furosemide 40 MG tablet Commonly known as: LASIX   hydrochlorothiazide 25 MG tablet Commonly known as: HYDRODIURIL   ibuprofen 600 MG tablet Commonly known as: ADVIL       TAKE these medications    albuterol (2.5 MG/3ML) 0.083% nebulizer solution Commonly known as: PROVENTIL Take 2.5 mg by nebulization as needed.   albuterol 108 (90 Base) MCG/ACT inhaler Commonly known as: VENTOLIN HFA Inhale 2 puffs into the lungs every 6 (six) hours as needed for wheezing or shortness of breath.   ALIVE WOMENS 50+ GUMMY PO Take 1 Dose by mouth in the morning.   Artificial Tears 0.2-0.2-1 % Soln Generic drug: Glycerin-Hypromellose-PEG 400 Place 1 drop into both eyes 2 (two) times daily.   aspirin EC 81 MG tablet Take 81 mg by mouth daily.   benzonatate 200 MG capsule Commonly known as: TESSALON Take 1 capsule (200 mg total) by mouth 3 (three) times daily as needed for cough.   cetirizine 5 MG tablet Commonly known as: ZYRTEC Take 5 mg by mouth daily.   cyanocobalamin 500 MCG tablet Commonly known as: VITAMIN B12 Take 1 tablet (500 mcg total) by mouth daily.   Delsym 30 MG/5ML liquid Generic drug: dextromethorphan Take 10 mLs by mouth every 12 (twelve) hours as needed for cough.   diclofenac Sodium 1 % Gel Commonly known as: VOLTAREN Apply 2 g topically every 6 (six) hours as needed (pain).   enalapril 20 MG tablet Commonly known as: VASOTEC Take 20 mg by mouth 2 (two) times daily. Hold for SBP < 110   fluticasone 50 MCG/ACT nasal spray Commonly known as: FLONASE Place 1 spray into both nostrils daily as needed for allergies or rhinitis.   guaiFENesin 600 MG 12 hr tablet Commonly known as: MUCINEX Take 600 mg by mouth 2  (two) times daily.   oxyCODONE 5 MG immediate release tablet Commonly known as: Oxy IR/ROXICODONE Take 1 tablet (5 mg total) by mouth every 6 (six) hours as needed for moderate pain (pain score 4-6), breakthrough pain or severe pain (pain score 7-10).   pantoprazole 40 MG tablet Commonly known as: Protonix Take 1 tablet (40 mg total) by mouth 2 (two) times daily before a meal.   polyethylene glycol 17 g packet Commonly known as: MIRALAX / GLYCOLAX Take 17 g by mouth daily as needed for mild constipation or moderate constipation.   senna 8.6 MG Tabs tablet Commonly known as: SENOKOT Take 1 tablet by mouth 2 (two) times daily.   Synthroid 175 MCG tablet Generic drug: levothyroxine Take 175 mcg by mouth 2 (two) times daily.   Trelegy Ellipta 100-62.5-25 MCG/ACT Aepb Generic drug: Fluticasone-Umeclidin-Vilant Inhale 1 puff into the lungs daily.  Follow-up Information     Kathryne Hitch, MD. Schedule an appointment as soon as possible for a visit in 9 day(s).   Specialty: Orthopedic Surgery Contact information: [address] [place] [address] [phone]                Allergies  Allergen Reactions   Duloxetine Hcl Other (See Comments)   Oxybutynin Other (See Comments)   Penicillins Other (See Comments)    Listed on MAR as allergy Unknown reaction    Sulfa Antibiotics Other (See Comments)    Listed on MAR as allergy Unknown reaction    You were cared for by a hospitalist during your hospital stay. If you have any questions about your discharge medications or the care you received while you were in the hospital after you are discharged, you can call the unit and asked to speak with the hospitalist on call if the hospitalist that took care of you is not available. Once you are discharged, your primary care physician will handle any further medical issues. Please note that no refills for any discharge medications will be authorized once you are  discharged, as it is imperative that you return to your primary care physician (or establish a relationship with a primary care physician if you do not have one) for your aftercare needs so that they can reassess your need for medications and monitor your lab values.  You were cared for by a hospitalist during your hospital stay. If you have any questions about your discharge medications or the care you received while you were in the hospital after you are discharged, you can call the unit and asked to speak with the hospitalist on call if the hospitalist that took care of you is not available. Once you are discharged, your primary care physician will handle any further medical issues. Please note that NO REFILLS for any discharge medications will be authorized once you are discharged, as it is imperative that you return to your primary care physician (or establish a relationship with a primary care physician if you do not have one) for your aftercare needs so that they can reassess your need for medications and monitor your lab values.  Please request your Prim.MD to go over all Hospital Tests and Procedure/Radiological results at the follow up, please get all Hospital records sent to your Prim MD by signing hospital release before you go home.  Get CBC, CMP, 2 view Chest X ray checked  by Primary MD during your next visit or SNF MD in 5-7 days ( we routinely change or add medications that can affect your baseline labs and fluid status, therefore we recommend that you get the mentioned basic workup next visit with your PCP, your PCP may decide not to get them or add new tests based on their clinical decision)  On your next visit with your primary care physician please Get Medicines reviewed and adjusted.  If you experience worsening of your admission symptoms, develop shortness of breath, life threatening emergency, suicidal or homicidal thoughts you must seek medical attention immediately by calling 911  or calling your MD immediately  if symptoms less severe.  You Must read complete instructions/literature along with all the possible adverse reactions/side effects for all the Medicines you take and that have been prescribed to you. Take any new Medicines after you have completely understood and accpet all the possible adverse reactions/side effects.   Do not drive, operate heavy machinery, perform activities at heights, swimming or  participation in water activities or provide baby sitting services if your were admitted for syncope or siezures until you have seen by Primary MD or a Neurologist and advised to do so again.  Do not drive when taking Pain medications.   Procedures/Studies: US RENAL  Result Date: 01/12/2023 CLINICAL DATA:  Acute kidney insufficiency EXAM: RENAL / URINARY TRACT ULTRASOUND COMPLETE COMPARISON:  None Available. FINDINGS: Right Kidney: Renal measurements: 8.2 x 4.2 x 3.8 cm = volume: 68.3 mL. There is some parenchymal atrophy of the right kidney. No collecting system dilatation or perinephric fluid. Left Kidney: Renal measurements: 9.0 x 5.7 x 5.5 cm = volume: 146 mL. Echogenicity within normal limits. No mass or hydronephrosis visualized. Bladder: Underdistended. Other: Limited by overlapping bowel gas and soft tissue. IMPRESSION: Right-sided renal atrophy.  No collecting system dilatation. Electronically Signed   By: Karen Kays M.D.   On: 01/12/2023 12:58   MR CERVICAL SPINE WO CONTRAST  Result Date: 01/10/2023 CLINICAL DATA:  Chronic neck pain and back pain for 1 year. No acute injury or prior relevant surgery. EXAM: MRI CERVICAL SPINE WITHOUT CONTRAST TECHNIQUE: Multiplanar, multisequence MR imaging of the cervical spine was performed. No intravenous contrast was administered. COMPARISON:  Cervical spine radiographs 05/30/2022. No other relevant comparison studies. FINDINGS: Alignment: Gradual reversal of the usual cervical lordosis without focal angulation or  listhesis. Vertebrae: No acute or suspicious osseous findings. There are scattered endplate degenerative changes. Cord: Normal in signal and caliber. Posterior Fossa, vertebral arteries, paraspinal tissues: Visualized portions of the posterior fossa appear unremarkable.Dominant left vertebral artery with a normal flow void. Cannot confirm patency proximally within a diminutive right vertebral artery. No significant paraspinal findings. Disc levels: C2-3: The disc appears normal. Mild facet degenerative changes contributing to mild left greater than right foraminal narrowing. The spinal canal is widely patent. C3-4: The disc appears normal. Mild uncinate spurring and bilateral facet hypertrophy. No spinal stenosis or significant foraminal narrowing. C4-5: Spondylosis with loss of disc height and posterior osteophytes covering diffusely bulging disc material. The spinal canal is patent. There is mild-to-moderate foraminal narrowing bilaterally. C5-6: Spondylosis with loss of disc height and posterior osteophytes covering diffusely bulging disc material. The spinal canal is patent. There is mild-to-moderate foraminal narrowing bilaterally. C6-7: Spondylosis with loss of disc height and posterior osteophytes covering diffusely bulging disc material. The spinal canal is patent. Mild foraminal narrowing bilaterally. C7-T1: The disc appears normal. Mild bilateral facet hypertrophy. No spinal stenosis or significant foraminal narrowing. IMPRESSION: 1. No acute findings or clear explanation for the patient's symptoms. 2. Multilevel cervical spondylosis with disc bulging and facet hypertrophy as described. No significant spinal stenosis or cord compression. 3. Mild-to-moderate foraminal narrowing bilaterally at C4-5 and C5-6. Mild foraminal narrowing bilaterally at C6-7. 4. Dominant left vertebral artery with a normal flow void. Cannot confirm patency proximally within a diminutive right vertebral artery. Electronically  Signed   By: Carey Bullocks M.D.   On: 01/10/2023 16:03   CT ANKLE LEFT WO CONTRAST  Result Date: 01/10/2023 CLINICAL DATA:  Ankle fracture status post recent fall. EXAM: CT OF THE LEFT ANKLE WITHOUT CONTRAST TECHNIQUE: Multidetector CT imaging of the left ankle was performed according to the standard protocol. Multiplanar CT image reconstructions were also generated. RADIATION DOSE REDUCTION: This exam was performed according to the departmental dose-optimization program which includes automated exposure control, adjustment of the mA and/or kV according to patient size and/or use of iterative reconstruction technique. COMPARISON:  Radiographs 01/09/2023. FINDINGS: Bones/Joint/Cartilage The ankle is splinted.  The bones are demineralized. There is a trimalleolar fracture. Oblique fracture of the distal fibular metadiaphysis demonstrates up to 5 mm of posterolateral displacement. Oblique fracture through the base of the medial malleolus extends into the metaphysis and demonstrates up to 5 mm of displacement. There is a comminuted fracture of the posterior malleolus with extension into the distal tibiofibular joint. This fracture demonstrates up to 5 mm of impaction of the distal articular surface of the tibial plafond. The talar dome is intact and located. There is mild lateral subluxation of the talus relative to the tibial plafond. The additional tarsal bones appear intact. Ligaments Suboptimally assessed by CT. Muscles and Tendons As evaluated by CT, the ankle tendons appear intact and normally located. No entrapment of the tendons in the fractures is seen. There is a prominent type 1 accessory navicular. Soft tissues Moderate soft tissue swelling around the ankle without evidence of focal hematoma, foreign body or soft tissue emphysema. IMPRESSION: 1. Trimalleolar fracture of the left ankle as described. 2. Mild lateral subluxation of the talus relative to the tibial plafond. 3. The talar dome is intact and  located. 4. The ankle tendons appear intact and normally located. Electronically Signed   By: Carey Bullocks M.D.   On: 01/10/2023 15:55   DG Chest 1 View  Result Date: 01/09/2023 CLINICAL DATA:  Fall cough EXAM: CHEST  1 VIEW COMPARISON:  08/23/2021 FINDINGS: Cardiomegaly. Streaky atelectasis at the bases. No consolidation, pleural effusion or pneumothorax. Aortic atherosclerosis IMPRESSION: Cardiomegaly with streaky atelectasis at the bases. Electronically Signed   By: Jasmine Pang M.D.   On: 01/09/2023 23:46   DG Knee Complete 4 Views Left  Result Date: 01/09/2023 CLINICAL DATA:  Fall EXAM: LEFT KNEE - COMPLETE 4+ VIEW COMPARISON:  None Available. FINDINGS: No fracture or malalignment. Mild patellofemoral medial joint space degenerative change. No significant effusion IMPRESSION: No acute osseous abnormality. Electronically Signed   By: Jasmine Pang M.D.   On: 01/09/2023 23:45   DG Ankle Complete Left  Result Date: 01/09/2023 CLINICAL DATA:  Mechanical fall ankle deformity EXAM: LEFT ANKLE COMPLETE - 3+ VIEW COMPARISON:  None Available. FINDINGS: Acute fracture involving distal shaft of fibula with about 1/2 shaft diameter posterior displacement of distal fibular fracture fragment. Acute mildly displaced medial malleolar fracture is well. Mild lateral subluxation of talar dome with respect to the distal tibia. Copious soft tissue swelling IMPRESSION: Acute displaced distal fibular and medial malleolar fractures. Mild lateral subluxation of talar dome with respect to the distal tibia. Electronically Signed   By: Jasmine Pang M.D.   On: 01/09/2023 23:44     The results of significant diagnostics from this hospitalization (including imaging, microbiology, ancillary and laboratory) are listed below for reference.     Microbiology: Recent Results (from the past 240 hour(s))  Resp panel by RT-PCR (RSV, Flu A&B, Covid) Anterior Nasal Swab     Status: None   Collection Time: 01/09/23 11:20 PM    Specimen: Anterior Nasal Swab  Result Value Ref Range Status   SARS Coronavirus 2 by RT PCR NEGATIVE NEGATIVE Final    Comment: (NOTE) SARS-CoV-2 target nucleic acids are NOT DETECTED.  The SARS-CoV-2 RNA is generally detectable in upper respiratory specimens during the acute phase of infection. The lowest concentration of SARS-CoV-2 viral copies this assay can detect is 138 copies/mL. A negative result does not preclude SARS-Cov-2 infection and should not be used as the sole basis for treatment or other patient management decisions. A negative result may  occur with  improper specimen collection/handling, submission of specimen other than nasopharyngeal swab, presence of viral mutation(s) within the areas targeted by this assay, and inadequate number of viral copies(<138 copies/mL). A negative result must be combined with clinical observations, patient history, and epidemiological information. The expected result is Negative.  Fact Sheet for Patients:  BloggerCourse.com  Fact Sheet for Healthcare Providers:  SeriousBroker.it  This test is no t yet approved or cleared by the [place] FDA and  has been authorized for detection and/or diagnosis of SARS-CoV-2 by FDA under an Emergency Use Authorization (EUA). This EUA will remain  in effect (meaning this test can be used) for the duration of the COVID-19 declaration under Section 564(b)(1) of the Act, 21 U.S.C.section 360bbb-3(b)(1), unless the authorization is terminated  or revoked sooner.       Influenza A by PCR NEGATIVE NEGATIVE Final   Influenza B by PCR NEGATIVE NEGATIVE Final    Comment: (NOTE) The Xpert Xpress SARS-CoV-2/FLU/RSV plus assay is intended as an aid in the diagnosis of influenza from Nasopharyngeal swab specimens and should not be used as a sole basis for treatment. Nasal washings and aspirates are unacceptable for Xpert Xpress  SARS-CoV-2/FLU/RSV testing.  Fact Sheet for Patients: BloggerCourse.com  Fact Sheet for Healthcare Providers: SeriousBroker.it  This test is not yet approved or cleared by the [place] FDA and has been authorized for detection and/or diagnosis of SARS-CoV-2 by FDA under an Emergency Use Authorization (EUA). This EUA will remain in effect (meaning this test can be used) for the duration of the COVID-19 declaration under Section 564(b)(1) of the Act, 21 U.S.C. section 360bbb-3(b)(1), unless the authorization is terminated or revoked.     Resp Syncytial Virus by PCR NEGATIVE NEGATIVE Final    Comment: (NOTE) Fact Sheet for Patients: BloggerCourse.com  Fact Sheet for Healthcare Providers: SeriousBroker.it  This test is not yet approved or cleared by the [place] FDA and has been authorized for detection and/or diagnosis of SARS-CoV-2 by FDA under an Emergency Use Authorization (EUA). This EUA will remain in effect (meaning this test can be used) for the duration of the COVID-19 declaration under Section 564(b)(1) of the Act, 21 U.S.C. section 360bbb-3(b)(1), unless the authorization is terminated or revoked.  Performed at [facility], [address], [place], [place] [address]      Labs: BNP (last 3 results) No results for input(s): "BNP" in the last 8760 hours. Basic Metabolic Panel: Recent Labs  Lab 01/09/23 2333 01/10/23 0445 01/11/23 0701 01/12/23 0328 01/13/23 0319  NA 143 140 139 140 139  K 3.5 3.2* 3.9 4.3 3.9  CL 95* 93* 92* 94* 94*  CO2 38* 34* 37* 36* 36*  GLUCOSE 126* 107* 114* 122* 112*  BUN 35* 34* 38* 46* 35*  CREATININE 1.22* 1.20* 1.47* 1.84* 1.16*  CALCIUM 9.1 8.9 9.1 8.8* 8.7*  MG  --  2.1 2.0  --   --   PHOS  --  3.8  --   --   --    Liver Function Tests: No results for input(s): "AST", "ALT", "ALKPHOS", "BILITOT",  "PROT", "ALBUMIN" in the last 168 hours. No results for input(s): "LIPASE", "AMYLASE" in the last 168 hours. No results for input(s): "AMMONIA" in the last 168 hours. CBC: Recent Labs  Lab 01/09/23 2333 01/10/23 0445 01/11/23 0701 01/13/23 0319  WBC 9.2 8.2 8.3 7.4  NEUTROABS 7.2  --   --   --   HGB 9.7* 9.1* 9.7* 8.5*  HCT 31.7* 30.2* 31.7* 27.2*  MCV 95.5 96.5 96.1 95.1  PLT 202 201 190 163   Cardiac Enzymes: No results for input(s): "CKTOTAL", "CKMB", "CKMBINDEX", "TROPONINI" in the last 168 hours. BNP: Invalid input(s): "POCBNP" CBG: No results for input(s): "GLUCAP" in the last 168 hours. D-Dimer No results for input(s): "DDIMER" in the last 72 hours. Hgb A1c No results for input(s): "HGBA1C" in the last 72 hours. Lipid Profile No results for input(s): "CHOL", "HDL", "LDLCALC", "TRIG", "CHOLHDL", "LDLDIRECT" in the last 72 hours. Thyroid function studies No results for input(s): "TSH", "T4TOTAL", "T3FREE", "THYROIDAB" in the last 72 hours.  Invalid input(s): "FREET3" Anemia work up No results for input(s): "VITAMINB12", "FOLATE", "FERRITIN", "TIBC", "IRON", "RETICCTPCT" in the last 72 hours. Urinalysis    Component Value Date/Time   COLORURINE YELLOW 01/10/2023 0131   APPEARANCEUR CLEAR 01/10/2023 0131   LABSPEC 1.020 01/10/2023 0131   PHURINE 5.0 01/10/2023 0131   GLUCOSEU NEGATIVE 01/10/2023 0131   HGBUR NEGATIVE 01/10/2023 0131   BILIRUBINUR NEGATIVE 01/10/2023 0131   KETONESUR NEGATIVE 01/10/2023 0131   PROTEINUR NEGATIVE 01/10/2023 0131   NITRITE NEGATIVE 01/10/2023 0131   LEUKOCYTESUR SMALL (A) 01/10/2023 0131   Sepsis Labs Recent Labs  Lab 01/09/23 2333 01/10/23 0445 01/11/23 0701 01/13/23 0319  WBC 9.2 8.2 8.3 7.4   Microbiology Recent Results (from the past 240 hour(s))  Resp panel by RT-PCR (RSV, Flu A&B, Covid) Anterior Nasal Swab     Status: None   Collection Time: 01/09/23 11:20 PM   Specimen: Anterior Nasal Swab  Result Value Ref  Range Status   SARS Coronavirus 2 by RT PCR NEGATIVE NEGATIVE Final    Comment: (NOTE) SARS-CoV-2 target nucleic acids are NOT DETECTED.  The SARS-CoV-2 RNA is generally detectable in upper respiratory specimens during the acute phase of infection. The lowest concentration of SARS-CoV-2 viral copies this assay can detect is 138 copies/mL. A negative result does not preclude SARS-Cov-2 infection and should not be used as the sole basis for treatment or other patient management decisions. A negative result may occur with  improper specimen collection/handling, submission of specimen other than nasopharyngeal swab, presence of viral mutation(s) within the areas targeted by this assay, and inadequate number of viral copies(<138 copies/mL). A negative result must be combined with clinical observations, patient history, and epidemiological information. The expected result is Negative.  Fact Sheet for Patients:  BloggerCourse.com  Fact Sheet for Healthcare Providers:  SeriousBroker.it  This test is no t yet approved or cleared by the [place] FDA and  has been authorized for detection and/or diagnosis of SARS-CoV-2 by FDA under an Emergency Use Authorization (EUA). This EUA will remain  in effect (meaning this test can be used) for the duration of the COVID-19 declaration under Section 564(b)(1) of the Act, 21 U.S.C.section 360bbb-3(b)(1), unless the authorization is terminated  or revoked sooner.       Influenza A by PCR NEGATIVE NEGATIVE Final   Influenza B by PCR NEGATIVE NEGATIVE Final    Comment: (NOTE) The Xpert Xpress SARS-CoV-2/FLU/RSV plus assay is intended as an aid in the diagnosis of influenza from Nasopharyngeal swab specimens and should not be used as a sole basis for treatment. Nasal washings and aspirates are unacceptable for Xpert Xpress SARS-CoV-2/FLU/RSV testing.  Fact Sheet for  Patients: BloggerCourse.com  Fact Sheet for Healthcare Providers: SeriousBroker.it  This test is not yet approved or cleared by the [place] FDA and has been authorized for detection and/or diagnosis of SARS-CoV-2 by FDA under  an Emergency Use Authorization (EUA). This EUA will remain in effect (meaning this test can be used) for the duration of the COVID-19 declaration under Section 564(b)(1) of the Act, 21 U.S.C. section 360bbb-3(b)(1), unless the authorization is terminated or revoked.     Resp Syncytial Virus by PCR NEGATIVE NEGATIVE Final    Comment: (NOTE) Fact Sheet for Patients: BloggerCourse.com  Fact Sheet for Healthcare Providers: SeriousBroker.it  This test is not yet approved or cleared by the [place] FDA and has been authorized for detection and/or diagnosis of SARS-CoV-2 by FDA under an Emergency Use Authorization (EUA). This EUA will remain in effect (meaning this test can be used) for the duration of the COVID-19 declaration under Section 564(b)(1) of the Act, 21 U.S.C. section 360bbb-3(b)(1), unless the authorization is terminated or revoked.  Performed at [facility], [address] [place], [place] [address]      Time coordinating discharge:  I have spent 35 minutes face to face with the patient and on the ward discussing the patients care, assessment, plan and disposition with other care givers. >50% of the time was devoted counseling the patient about the risks and benefits of treatment/Discharge disposition and coordinating care.   SIGNED:   Miguel Rota, MD  [facility] 01/14/2023, 11:28 AM   If 7PM-7AM, please contact night-coverage

## 2023-01-14 NOTE — Plan of Care (Addendum)
  Problem: Education: Goal: Knowledge of General Education information will improve Description: Including pain rating scale, medication(s)/side effects and non-pharmacologic comfort measures Outcome: Progressing   Problem: Health Behavior/Discharge Planning: Goal: Ability to manage health-related needs will improve Outcome: Progressing   Problem: Clinical Measurements: Goal: Will remain free from infection Outcome: Progressing Goal: Diagnostic test results will improve Outcome: Progressing Goal: Respiratory complications will improve Outcome: Progressing   Problem: Nutrition: Goal: Adequate nutrition will be maintained Outcome: Progressing   Problem: Coping: Goal: Level of anxiety will decrease Outcome: Progressing   Problem: Pain Management: Goal: General experience of comfort will improve Outcome: Progressing   Problem: Safety: Goal: Ability to remain free from injury will improve Outcome: Progressing  Patient will be [facility] room 610 B and report was given to a nursing supervisor,Maggie Vaughn at 1539. The transport come at 1722. AVS was printed and given to the [facility]. The patient left the unit at 1733 in a stable condition.

## 2023-01-15 ENCOUNTER — Emergency Department (HOSPITAL_COMMUNITY): Payer: Medicare Other

## 2023-01-15 ENCOUNTER — Inpatient Hospital Stay (HOSPITAL_COMMUNITY)
Admission: EM | Admit: 2023-01-15 | Discharge: 2023-01-21 | DRG: 193 | Disposition: A | Discharge status: Skilled Nursing Facility | Payer: Medicare Other | Source: Skilled Nursing Facility | Attending: Internal Medicine | Admitting: Internal Medicine

## 2023-01-15 ENCOUNTER — Emergency Department (HOSPITAL_BASED_OUTPATIENT_CLINIC_OR_DEPARTMENT_OTHER)
Admit: 2023-01-15 | Discharge: 2023-01-15 | Disposition: A | Discharge status: Home / Self Care | Payer: Medicare Other | Attending: Emergency Medicine | Admitting: Emergency Medicine

## 2023-01-15 ENCOUNTER — Encounter (HOSPITAL_COMMUNITY): Payer: Self-pay

## 2023-01-15 ENCOUNTER — Other Ambulatory Visit: Payer: Self-pay

## 2023-01-15 DIAGNOSIS — Z87891 Personal history of nicotine dependence: Secondary | ICD-10-CM

## 2023-01-15 DIAGNOSIS — D631 Anemia in chronic kidney disease: Secondary | ICD-10-CM | POA: Diagnosis present

## 2023-01-15 DIAGNOSIS — J449 Chronic obstructive pulmonary disease, unspecified: Secondary | ICD-10-CM | POA: Diagnosis present

## 2023-01-15 DIAGNOSIS — S82832A Other fracture of upper and lower end of left fibula, initial encounter for closed fracture: Secondary | ICD-10-CM | POA: Diagnosis present

## 2023-01-15 DIAGNOSIS — R2681 Unsteadiness on feet: Secondary | ICD-10-CM

## 2023-01-15 DIAGNOSIS — J9612 Chronic respiratory failure with hypercapnia: Secondary | ICD-10-CM | POA: Diagnosis not present

## 2023-01-15 DIAGNOSIS — G4733 Obstructive sleep apnea (adult) (pediatric): Secondary | ICD-10-CM | POA: Diagnosis present

## 2023-01-15 DIAGNOSIS — J9611 Chronic respiratory failure with hypoxia: Secondary | ICD-10-CM | POA: Diagnosis present

## 2023-01-15 DIAGNOSIS — E039 Hypothyroidism, unspecified: Secondary | ICD-10-CM | POA: Diagnosis present

## 2023-01-15 DIAGNOSIS — Z1152 Encounter for screening for COVID-19: Secondary | ICD-10-CM

## 2023-01-15 DIAGNOSIS — Z91199 Patient's noncompliance with other medical treatment and regimen due to unspecified reason: Secondary | ICD-10-CM

## 2023-01-15 DIAGNOSIS — Z885 Allergy status to narcotic agent status: Secondary | ICD-10-CM

## 2023-01-15 DIAGNOSIS — R410 Disorientation, unspecified: Principal | ICD-10-CM

## 2023-01-15 DIAGNOSIS — I130 Hypertensive heart and chronic kidney disease with heart failure and stage 1 through stage 4 chronic kidney disease, or unspecified chronic kidney disease: Secondary | ICD-10-CM | POA: Diagnosis present

## 2023-01-15 DIAGNOSIS — Z6841 Body Mass Index (BMI) 40.0 and over, adult: Secondary | ICD-10-CM

## 2023-01-15 DIAGNOSIS — Z79899 Other long term (current) drug therapy: Secondary | ICD-10-CM

## 2023-01-15 DIAGNOSIS — E785 Hyperlipidemia, unspecified: Secondary | ICD-10-CM | POA: Diagnosis present

## 2023-01-15 DIAGNOSIS — M6281 Muscle weakness (generalized): Secondary | ICD-10-CM | POA: Diagnosis not present

## 2023-01-15 DIAGNOSIS — W19XXXA Unspecified fall, initial encounter: Secondary | ICD-10-CM | POA: Diagnosis present

## 2023-01-15 DIAGNOSIS — Z8616 Personal history of COVID-19: Secondary | ICD-10-CM

## 2023-01-15 DIAGNOSIS — S82892A Other fracture of left lower leg, initial encounter for closed fracture: Secondary | ICD-10-CM | POA: Diagnosis present

## 2023-01-15 DIAGNOSIS — R053 Chronic cough: Secondary | ICD-10-CM | POA: Diagnosis not present

## 2023-01-15 DIAGNOSIS — I728 Aneurysm of other specified arteries: Secondary | ICD-10-CM | POA: Diagnosis present

## 2023-01-15 DIAGNOSIS — J189 Pneumonia, unspecified organism: Principal | ICD-10-CM | POA: Diagnosis present

## 2023-01-15 DIAGNOSIS — R609 Edema, unspecified: Secondary | ICD-10-CM

## 2023-01-15 DIAGNOSIS — J440 Chronic obstructive pulmonary disease with acute lower respiratory infection: Secondary | ICD-10-CM | POA: Diagnosis present

## 2023-01-15 DIAGNOSIS — R1312 Dysphagia, oropharyngeal phase: Secondary | ICD-10-CM | POA: Diagnosis present

## 2023-01-15 DIAGNOSIS — Z888 Allergy status to other drugs, medicaments and biological substances status: Secondary | ICD-10-CM

## 2023-01-15 DIAGNOSIS — G928 Other toxic encephalopathy: Secondary | ICD-10-CM | POA: Diagnosis present

## 2023-01-15 DIAGNOSIS — G9341 Metabolic encephalopathy: Secondary | ICD-10-CM | POA: Diagnosis present

## 2023-01-15 DIAGNOSIS — R4182 Altered mental status, unspecified: Secondary | ICD-10-CM | POA: Diagnosis present

## 2023-01-15 DIAGNOSIS — Z882 Allergy status to sulfonamides status: Secondary | ICD-10-CM

## 2023-01-15 DIAGNOSIS — R296 Repeated falls: Secondary | ICD-10-CM

## 2023-01-15 DIAGNOSIS — Z7951 Long term (current) use of inhaled steroids: Secondary | ICD-10-CM

## 2023-01-15 DIAGNOSIS — R051 Acute cough: Secondary | ICD-10-CM

## 2023-01-15 DIAGNOSIS — Z66 Do not resuscitate: Secondary | ICD-10-CM | POA: Diagnosis present

## 2023-01-15 DIAGNOSIS — Z79890 Hormone replacement therapy (postmenopausal): Secondary | ICD-10-CM

## 2023-01-15 DIAGNOSIS — Z880 Allergy status to penicillin: Secondary | ICD-10-CM

## 2023-01-15 DIAGNOSIS — Z9981 Dependence on supplemental oxygen: Secondary | ICD-10-CM

## 2023-01-15 DIAGNOSIS — Z7982 Long term (current) use of aspirin: Secondary | ICD-10-CM

## 2023-01-15 DIAGNOSIS — N1831 Chronic kidney disease, stage 3a: Secondary | ICD-10-CM | POA: Diagnosis present

## 2023-01-15 DIAGNOSIS — D519 Vitamin B12 deficiency anemia, unspecified: Secondary | ICD-10-CM | POA: Diagnosis present

## 2023-01-15 DIAGNOSIS — I10 Essential (primary) hypertension: Secondary | ICD-10-CM | POA: Diagnosis present

## 2023-01-15 DIAGNOSIS — R918 Other nonspecific abnormal finding of lung field: Secondary | ICD-10-CM

## 2023-01-15 DIAGNOSIS — E66813 Obesity, class 3: Secondary | ICD-10-CM | POA: Diagnosis present

## 2023-01-15 DIAGNOSIS — R443 Hallucinations, unspecified: Secondary | ICD-10-CM | POA: Diagnosis present

## 2023-01-15 DIAGNOSIS — T402X5A Adverse effect of other opioids, initial encounter: Secondary | ICD-10-CM | POA: Diagnosis present

## 2023-01-15 DIAGNOSIS — I5032 Chronic diastolic (congestive) heart failure: Secondary | ICD-10-CM | POA: Diagnosis present

## 2023-01-15 LAB — COMPREHENSIVE METABOLIC PANEL
ALT: 14 U/L (ref 0–44)
AST: 18 U/L (ref 15–41)
Albumin: 2.6 g/dL — ABNORMAL LOW (ref 3.5–5.0)
Alkaline Phosphatase: 78 U/L (ref 38–126)
Anion gap: 9 (ref 5–15)
BUN: 17 mg/dL (ref 8–23)
CO2: 32 mmol/L (ref 22–32)
Calcium: 8.9 mg/dL (ref 8.9–10.3)
Chloride: 97 mmol/L — ABNORMAL LOW (ref 98–111)
Creatinine, Ser: 0.99 mg/dL (ref 0.44–1.00)
GFR, Estimated: 56 mL/min — ABNORMAL LOW (ref >60)
Glucose, Bld: 104 mg/dL — ABNORMAL HIGH (ref 70–99)
Potassium: 4.1 mmol/L (ref 3.5–5.1)
Sodium: 138 mmol/L (ref 135–145)
Total Bilirubin: 0.9 mg/dL (ref <1.2)
Total Protein: 6.5 g/dL (ref 6.5–8.1)

## 2023-01-15 LAB — CBC WITH DIFFERENTIAL/PLATELET
Abs Immature Granulocytes: 0.03 10*3/uL (ref 0.00–0.07)
Basophils Absolute: 0 10*3/uL (ref 0.0–0.1)
Basophils Relative: 0 %
Eosinophils Absolute: 0.1 10*3/uL (ref 0.0–0.5)
Eosinophils Relative: 1 %
HCT: 27.1 % — ABNORMAL LOW (ref 36.0–46.0)
Hemoglobin: 8.4 g/dL — ABNORMAL LOW (ref 12.0–15.0)
Immature Granulocytes: 0 %
Lymphocytes Relative: 8 %
Lymphs Abs: 0.8 10*3/uL (ref 0.7–4.0)
MCH: 29.8 pg (ref 26.0–34.0)
MCHC: 31 g/dL (ref 30.0–36.0)
MCV: 96.1 fL (ref 80.0–100.0)
Monocytes Absolute: 1.2 10*3/uL — ABNORMAL HIGH (ref 0.1–1.0)
Monocytes Relative: 12 %
Neutro Abs: 7.5 10*3/uL (ref 1.7–7.7)
Neutrophils Relative %: 79 %
Platelets: 218 10*3/uL (ref 150–400)
RBC: 2.82 MIL/uL — ABNORMAL LOW (ref 3.87–5.11)
RDW: 15.8 % — ABNORMAL HIGH (ref 11.5–15.5)
WBC: 9.6 10*3/uL (ref 4.0–10.5)
nRBC: 0.3 % — ABNORMAL HIGH (ref 0.0–0.2)

## 2023-01-15 LAB — BLOOD GAS, VENOUS
Acid-Base Excess: 8.7 mmol/L — ABNORMAL HIGH (ref 0.0–2.0)
Bicarbonate: 34.7 mmol/L — ABNORMAL HIGH (ref 20.0–28.0)
O2 Saturation: 70 %
Patient temperature: 37
pCO2, Ven: 56 mm[Hg] (ref 44–60)
pH, Ven: 7.4 (ref 7.25–7.43)
pO2, Ven: 41 mm[Hg] (ref 32–45)

## 2023-01-15 LAB — URINALYSIS, ROUTINE W REFLEX MICROSCOPIC
Bilirubin Urine: NEGATIVE
Glucose, UA: NEGATIVE mg/dL
Hgb urine dipstick: NEGATIVE
Ketones, ur: 5 mg/dL — AB
Leukocytes,Ua: NEGATIVE
Nitrite: NEGATIVE
Protein, ur: NEGATIVE mg/dL
Specific Gravity, Urine: 1.043 — ABNORMAL HIGH (ref 1.005–1.030)
pH: 5 (ref 5.0–8.0)

## 2023-01-15 LAB — D-DIMER, QUANTITATIVE: D-Dimer, Quant: 1.77 ug{FEU}/mL — ABNORMAL HIGH (ref 0.00–0.50)

## 2023-01-15 LAB — TSH: TSH: 0.428 u[IU]/mL (ref 0.350–4.500)

## 2023-01-15 LAB — T4, FREE: Free T4: 2.06 ng/dL — ABNORMAL HIGH (ref 0.61–1.12)

## 2023-01-15 MED ORDER — SODIUM CHLORIDE 0.9 % IV SOLN
1.0000 g | Freq: Once | INTRAVENOUS | Status: AC
  Administered 2023-01-15: 1 g via INTRAVENOUS
  Filled 2023-01-15: qty 10

## 2023-01-15 MED ORDER — SODIUM CHLORIDE 0.9 % IV SOLN
2.0000 g | INTRAVENOUS | Status: DC
  Administered 2023-01-16 – 2023-01-17 (×2): 2 g via INTRAVENOUS
  Filled 2023-01-15 (×2): qty 20

## 2023-01-15 MED ORDER — BISACODYL 5 MG PO TBEC: 5.0000 mg | DELAYED_RELEASE_TABLET | Freq: Every day | ORAL | Status: DC | PRN

## 2023-01-15 MED ORDER — ONDANSETRON HCL 4 MG/2ML IJ SOLN
4.0000 mg | Freq: Four times a day (QID) | INTRAMUSCULAR | Status: DC | PRN
Start: 2023-01-15 — End: 2023-01-21

## 2023-01-15 MED ORDER — SENNOSIDES-DOCUSATE SODIUM 8.6-50 MG PO TABS: 1.0000 | ORAL_TABLET | Freq: Every evening | ORAL | Status: DC | PRN

## 2023-01-15 MED ORDER — PANTOPRAZOLE SODIUM 40 MG PO TBEC
40.0000 mg | DELAYED_RELEASE_TABLET | Freq: Two times a day (BID) | ORAL | Status: DC
  Administered 2023-01-16 – 2023-01-21 (×10): 40 mg via ORAL
  Filled 2023-01-15 (×11): qty 1

## 2023-01-15 MED ORDER — ACETAMINOPHEN 325 MG PO TABS: 650.0000 mg | ORAL_TABLET | Freq: Four times a day (QID) | ORAL | Status: DC | PRN

## 2023-01-15 MED ORDER — AZITHROMYCIN 250 MG PO TABS
500.0000 mg | ORAL_TABLET | Freq: Every day | ORAL | Status: AC
  Administered 2023-01-16 – 2023-01-20 (×5): 500 mg via ORAL
  Filled 2023-01-15 (×6): qty 2

## 2023-01-15 MED ORDER — AZITHROMYCIN 500 MG IV SOLR
500.0000 mg | Freq: Once | INTRAVENOUS | Status: AC
  Administered 2023-01-15: 500 mg via INTRAVENOUS
  Filled 2023-01-15: qty 5

## 2023-01-15 MED ORDER — ENALAPRIL MALEATE 10 MG PO TABS
20.0000 mg | ORAL_TABLET | Freq: Two times a day (BID) | ORAL | Status: DC
  Administered 2023-01-16 – 2023-01-21 (×10): 20 mg via ORAL
  Filled 2023-01-15 (×12): qty 2

## 2023-01-15 MED ORDER — LEVOTHYROXINE SODIUM 25 MCG PO TABS: 175.0000 ug | ORAL_TABLET | Freq: Two times a day (BID) | ORAL | Status: DC

## 2023-01-15 MED ORDER — ALBUTEROL SULFATE HFA 108 (90 BASE) MCG/ACT IN AERS: 2.0000 | INHALATION_SPRAY | Freq: Four times a day (QID) | RESPIRATORY_TRACT | Status: DC | PRN

## 2023-01-15 MED ORDER — FLUTICASONE FUROATE-VILANTEROL 100-25 MCG/ACT IN AEPB
1.0000 | INHALATION_SPRAY | Freq: Every day | RESPIRATORY_TRACT | Status: DC
  Administered 2023-01-16 – 2023-01-21 (×6): 1 via RESPIRATORY_TRACT
  Filled 2023-01-15: qty 28

## 2023-01-15 MED ORDER — SENNA 8.6 MG PO TABS
1.0000 | ORAL_TABLET | Freq: Two times a day (BID) | ORAL | Status: DC
  Administered 2023-01-16 – 2023-01-21 (×10): 8.6 mg via ORAL
  Filled 2023-01-15 (×11): qty 1

## 2023-01-15 MED ORDER — ENOXAPARIN SODIUM 60 MG/0.6ML IJ SOSY
55.0000 mg | PREFILLED_SYRINGE | INTRAMUSCULAR | Status: DC
  Administered 2023-01-16 – 2023-01-21 (×6): 55 mg via SUBCUTANEOUS
  Filled 2023-01-15 (×6): qty 0.6

## 2023-01-15 MED ORDER — ONDANSETRON HCL 4 MG PO TABS: 4.0000 mg | ORAL_TABLET | Freq: Four times a day (QID) | ORAL | Status: DC | PRN

## 2023-01-15 MED ORDER — IOHEXOL 350 MG/ML SOLN
75.0000 mL | Freq: Once | INTRAVENOUS | Status: AC | PRN
  Administered 2023-01-15: 75 mL via INTRAVENOUS

## 2023-01-15 MED ORDER — ACETAMINOPHEN 650 MG RE SUPP: 650.0000 mg | Freq: Four times a day (QID) | RECTAL | Status: DC | PRN

## 2023-01-15 MED ORDER — VITAMIN B-12 1000 MCG PO TABS
500.0000 ug | ORAL_TABLET | Freq: Every day | ORAL | Status: DC
Start: 2023-01-16 — End: 2023-01-21
  Administered 2023-01-16 – 2023-01-21 (×6): 500 ug via ORAL
  Filled 2023-01-15 (×6): qty 1

## 2023-01-15 MED ORDER — ALBUTEROL SULFATE (2.5 MG/3ML) 0.083% IN NEBU
2.5000 mg | INHALATION_SOLUTION | Freq: Four times a day (QID) | RESPIRATORY_TRACT | Status: DC | PRN
  Administered 2023-01-19: 2.5 mg via RESPIRATORY_TRACT
  Filled 2023-01-15: qty 3

## 2023-01-15 MED ORDER — UMECLIDINIUM BROMIDE 62.5 MCG/ACT IN AEPB
1.0000 | INHALATION_SPRAY | Freq: Every day | RESPIRATORY_TRACT | Status: DC
  Administered 2023-01-16: 1 via RESPIRATORY_TRACT
  Filled 2023-01-15: qty 7

## 2023-01-15 NOTE — ED Notes (Signed)
ED TO INPATIENT HANDOFF REPORT  ED Nurse Name and Phone #: Gearlean Alf Name/Age/Gender Valerie Bowman 87 y.o. Bowman Room/Bed: WA09/WA09  Code Status   Code Status: Prior  Home/SNF/Other Rehab Patient oriented to: self Is this baseline? No   Triage Complete: Triage complete  Chief Complaint Altered mental status [R41.82]  Triage Note The pt was bib EMS from [facility]. The pt was just admitted last night. The staff called and reported pt was confused last night but today she is really sleepy. Pt reports not having any sleep in 2 days. EMS reports pt A&Ox4. They pts O2 sats were 88%RA so she was placed on O2 at 2L/[place] and sats are 94%. VS B/P 122/60, P 60, CBG 120, O2 Sats 94% on O2@2L /[place].   Allergies Allergies  Allergen Reactions   Duloxetine Hcl Other (See Comments)   Oxybutynin Other (See Comments)   Penicillins Other (See Comments)    Listed on MAR as allergy Unknown reaction    Sulfa Antibiotics Other (See Comments)    Listed on MAR as allergy Unknown reaction    Level of Care/Admitting Diagnosis ED Disposition     ED Disposition  Admit   Condition  --   Comment  Hospital Area: [facility] [[id number]]  Level of Care: Med-Surg [16]  May place patient in observation at [facility] or [facility] if equivalent level of care is available:: No  Covid Evaluation: Symptomatic Person Under Investigation (PUI) or recent exposure (last 10 days) *Testing Required*  Diagnosis: Altered mental status [780.97.ICD-9-CM]  Admitting Physician: Charlsie Quest [[id number]]  Attending Physician: Charlsie Quest [[id number]]          B Medical/Surgery History Past Medical History:  Diagnosis Date   Acquired hypothyroidism    Chronic diastolic heart failure (HCC)    COPD (chronic obstructive pulmonary disease) (HCC)    Essential hypertension    Hyperlipidemia    Urinary incontinence    History reviewed. No pertinent surgical history.   A IV  Location/Drains/Wounds Patient Lines/Drains/Airways Status     Active Line/Drains/Airways     Name Placement date Placement time Site Days   Peripheral IV 01/15/23 20 G Left Antecubital 01/15/23  1519  Antecubital  less than 1            Intake/Output Last 24 hours No intake or output data in the 24 hours ending 01/15/23 2144  Labs/Imaging Results for orders placed or performed during the hospital encounter of 01/15/23 (from the past 48 hour(s))  Comprehensive metabolic panel     Status: Abnormal   Collection Time: 01/15/23  1:07 PM  Result Value Ref Range   Sodium 138 135 - 145 mmol/L   Potassium 4.1 3.5 - 5.1 mmol/L   Chloride 97 (L) 98 - 111 mmol/L   CO2 32 22 - 32 mmol/L   Glucose, Bld 104 (H) 70 - 99 mg/dL    Comment: Glucose reference range applies only to samples taken after fasting for at least 8 hours.   BUN 17 8 - 23 mg/dL   Creatinine, Ser 7.82 0.44 - 1.00 mg/dL   Calcium 8.9 8.9 - 95.6 mg/dL   Total Protein 6.5 6.5 - 8.1 g/dL   Albumin 2.6 (L) 3.5 - 5.0 g/dL   AST 18 15 - 41 U/L   ALT 14 0 - 44 U/L   Alkaline Phosphatase 78 38 - 126 U/L   Total Bilirubin 0.9 <1.2 mg/dL   GFR, Estimated 56 (  L) >60 mL/min    Comment: (NOTE) Calculated using the CKD-EPI Creatinine Equation (2021)    Anion gap 9 5 - 15    Comment: Performed at [facility], [address], [place], [place] [address]  CBC with Differential     Status: Abnormal   Collection Time: 01/15/23  1:07 PM  Result Value Ref Range   WBC 9.6 4.0 - 10.5 K/uL   RBC 2.82 (L) 3.87 - 5.11 MIL/uL   Hemoglobin 8.4 (L) 12.0 - 15.0 g/dL   HCT 29.5 (L) 62.1 - 30.8 %   MCV 96.1 80.0 - 100.0 fL   MCH 29.8 26.0 - 34.0 pg   MCHC 31.0 30.0 - 36.0 g/dL   RDW 65.7 (H) 84.6 - 96.2 %   Platelets 218 150 - 400 K/uL   nRBC 0.3 (H) 0.0 - 0.2 %   Neutrophils Relative % 79 %   Neutro Abs 7.5 1.7 - 7.7 K/uL   Lymphocytes Relative 8 %   Lymphs Abs 0.8 0.7 - 4.0 K/uL   Monocytes Relative 12 %    Monocytes Absolute 1.2 (H) 0.1 - 1.0 K/uL   Eosinophils Relative 1 %   Eosinophils Absolute 0.1 0.0 - 0.5 K/uL   Basophils Relative 0 %   Basophils Absolute 0.0 0.0 - 0.1 K/uL   Immature Granulocytes 0 %   Abs Immature Granulocytes 0.03 0.00 - 0.07 K/uL    Comment: Performed at [facility], [address] [place], [place] [address]  D-dimer, quantitative     Status: Abnormal   Collection Time: 01/15/23  1:07 PM  Result Value Ref Range   D-Dimer, Quant 1.77 (H) 0.00 - 0.50 ug/mL-FEU    Comment: (NOTE) At the manufacturer cut-off value of 0.5 g/mL FEU, this assay has a negative predictive value of 95-100%.This assay is intended for use in conjunction with a clinical pretest probability (PTP) assessment model to exclude pulmonary embolism (PE) and deep venous thrombosis (DVT) in outpatients suspected of PE or DVT. Results should be correlated with clinical presentation. Performed at [facility], [address] [place], [place] [address]   TSH     Status: None   Collection Time: 01/15/23  1:44 PM  Result Value Ref Range   TSH 0.428 0.350 - 4.500 uIU/mL    Comment: Performed by a 3rd Generation assay with a functional sensitivity of <=0.01 uIU/mL. Performed at [facility], [address], [place], [place] [address]   Blood gas, venous (at [facility] and [facility])     Status: Abnormal   Collection Time: 01/15/23  1:50 PM  Result Value Ref Range   pH, Ven 7.4 7.25 - 7.43   pCO2, Ven 56 44 - 60 mmHg   pO2, Ven 41 32 - 45 mmHg   Bicarbonate 34.7 (H) 20.0 - 28.0 mmol/L   Acid-Base Excess 8.7 (H) 0.0 - 2.0 mmol/L   O2 Saturation 70 %   Patient temperature 37.0     Comment: Performed at [facility], [address] [place], [place] [address]  Urinalysis, Routine w reflex microscopic -Urine, Clean Catch     Status: Abnormal   Collection Time: 01/15/23  5:28 PM  Result Value Ref Range   Color, Urine YELLOW YELLOW    APPearance CLEAR CLEAR   Specific Gravity, Urine 1.043 (H) 1.005 - 1.030   pH 5.0 5.0 - 8.0   Glucose, UA NEGATIVE NEGATIVE mg/dL   Hgb urine dipstick NEGATIVE NEGATIVE  Bilirubin Urine NEGATIVE NEGATIVE   Ketones, ur 5 (A) NEGATIVE mg/dL   Protein, ur NEGATIVE NEGATIVE mg/dL   Nitrite NEGATIVE NEGATIVE   Leukocytes,Ua NEGATIVE NEGATIVE    Comment: Performed at [facility], [address], [place], [place] [address]   VAS [name] LOWER EXTREMITY VENOUS (DVT) (7a-7p)  Result Date: 01/15/2023  Lower Venous DVT Study Patient Name:  HADLEE BURBACK  Date of Exam:   01/15/2023 Medical Rec #: [id number]   Accession #:    [id number] Date of Birth: 09-16-1935    Patient Gender: F Patient Age:   4 years Exam Location:  [facility] Procedure:      VAS [name] LOWER EXTREMITY VENOUS (DVT) Referring Phys: Molly Maduro LOCKWOOD --------------------------------------------------------------------------------  Indications: Edema.  Risk Factors: Surgery Trauma. Limitations: Body habitus, poor ultrasound/tissue interface and patient positioning, patient immobility, patient pain tolerance, hard cast. Comparison Study: No prior studies. Performing Technologist: Chanda Busing RVT  Examination Guidelines: A complete evaluation includes B-mode imaging, spectral Doppler, color Doppler, and power Doppler as needed of all accessible portions of each vessel. Bilateral testing is considered an integral part of a complete examination. Limited examinations for reoccurring indications may be performed as noted. The reflux portion of the exam is performed with the patient in reverse Trendelenburg.  +-----+---------------+---------+-----------+----------+--------------+ RIGHTCompressibilityPhasicitySpontaneityPropertiesThrombus Aging +-----+---------------+---------+-----------+----------+--------------+ CFV  Full           Yes      Yes                                  +-----+---------------+---------+-----------+----------+--------------+   +---------+---------------+---------+-----------+----------+-------------------+ LEFT     CompressibilityPhasicitySpontaneityPropertiesThrombus Aging      +---------+---------------+---------+-----------+----------+-------------------+ CFV      Full           Yes      Yes                                      +---------+---------------+---------+-----------+----------+-------------------+ SFJ      Full                                                             +---------+---------------+---------+-----------+----------+-------------------+ FV Prox  Full                                                             +---------+---------------+---------+-----------+----------+-------------------+ FV Mid                  Yes      Yes                                      +---------+---------------+---------+-----------+----------+-------------------+ FV Distal               Yes      Yes                                      +---------+---------------+---------+-----------+----------+-------------------+  POP                     Yes      Yes                                      +---------+---------------+---------+-----------+----------+-------------------+ PTV      Full                                                             +---------+---------------+---------+-----------+----------+-------------------+ PERO                                                  Not well visualized +---------+---------------+---------+-----------+----------+-------------------+     Summary: RIGHT: - No evidence of common femoral vein obstruction.   LEFT: - There is no evidence of deep vein thrombosis in the lower extremity. However, portions of this examination were limited- see technologist comments above.  - No cystic structure found in the popliteal fossa.  *See table(s) above for measurements and  observations. Electronically signed by Coral Else MD on 01/15/2023 at 9:29:03 PM.    Final    CT Head Wo Contrast  Result Date: 01/15/2023 CLINICAL DATA:  Altered mental status. EXAM: CT HEAD WITHOUT CONTRAST TECHNIQUE: Contiguous axial images were obtained from the base of the skull through the vertex without intravenous contrast. RADIATION DOSE REDUCTION: This exam was performed according to the departmental dose-optimization program which includes automated exposure control, adjustment of the mA and/or kV according to patient size and/or use of iterative reconstruction technique. COMPARISON:  Head CT dated 08/26/2021. FINDINGS: Brain: Mild age-related atrophy and chronic microvascular ischemic changes. There is no acute intracranial hemorrhage. No mass effect or midline shift. No extra-axial fluid collection. Vascular: No hyperdense vessel or unexpected calcification. Skull: Normal. Negative for fracture or focal lesion. Sinuses/Orbits: No acute finding. Other: None IMPRESSION: 1. No acute intracranial pathology. 2. Mild age-related atrophy and chronic microvascular ischemic changes. Electronically Signed   By: Elgie Collard M.D.   On: 01/15/2023 17:59   CT Angio Chest PE W/Cm &/Or Wo Cm  Result Date: 01/15/2023 CLINICAL DATA:  Mental status change EXAM: CT ANGIOGRAPHY CHEST WITH CONTRAST TECHNIQUE: Multidetector CT imaging of the chest was performed using the standard protocol during bolus administration of intravenous contrast. Multiplanar CT image reconstructions and MIPs were obtained to evaluate the vascular anatomy. RADIATION DOSE REDUCTION: This exam was performed according to the departmental dose-optimization program which includes automated exposure control, adjustment of the mA and/or kV according to patient size and/or use of iterative reconstruction technique. CONTRAST:  75mL OMNIPAQUE IOHEXOL 350 MG/ML SOLN COMPARISON:  None Available. FINDINGS: Cardiovascular: No evidence of central  pulmonary embolus. Limits evaluation of the segmental and subsegmental pulmonary arteries due to bolus timing and motion artifact. Mild cardiomegaly. Normal caliber thoracic aorta with moderate atherosclerotic disease. Mild coronary artery calcifications. Mediastinum/Nodes: Esophagus thyroid are unremarkable. Mildly enlarged bilateral hilar lymph nodes which are likely reactive. Reference right hilar lymph node measuring 13 mm in short axis on series 6, image 52. Lungs/Pleura: Central airways are patent. Moderate bilateral  bronchial wall thickening. Clustered solid nodules of the right upper lobe largest measures 13 x 8 mm on series 14, image 58. Additional solid nodule of the right middle lobe measuring 7 mm on series 14, image 84. Left-greater-than-right atelectasis of the lower lobes. No pleural effusion or pneumothorax. 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 Musculoskeletal: No chest wall abnormality. No acute or significant osseous findings. Review of the MIP images confirms the above findings. IMPRESSION: 1. No evidence of central pulmonary embolus. Limits evaluation of the segmental and subsegmental pulmonary arteries due to bolus timing and motion artifact. 2. Moderate bilateral bronchial wall thickening, findings can be seen in the setting of bronchitis. 3. Solid pulmonary nodules of the right upper lobe and right middle lobe, possibly infectious, largest measures up to 13 mm. Follow-up chest CT is recommended in 3 months to ensure resolution. 4. Mildly enlarged bilateral hilar lymph nodes which are likely reactive. 5. 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 6. Aortic Atherosclerosis (ICD10-I70.0). Electronically Signed   By: Allegra Lai M.D.   On: 01/15/2023 16:35   DG Chest Port 1 View  Result Date: 01/15/2023 CLINICAL DATA:  Fatigue. EXAM: PORTABLE CHEST 1 VIEW COMPARISON:  January 09, 2023. FINDINGS: Stable cardiomegaly.  Minimal bibasilar subsegmental atelectasis is noted. The visualized skeletal structures are unremarkable. IMPRESSION: Minimal bibasilar subsegmental atelectasis. Electronically Signed   By: Lupita Raider M.D.   On: 01/15/2023 16:26    Pending Labs Unresulted Labs (From admission, onward)     Start     Ordered   01/15/23 1307  T4, free  Once,   URGENT        01/15/23 1308            Vitals/Pain Today's Vitals   01/15/23 1500 01/15/23 1728 01/15/23 2030 01/15/23 2100  BP: (!) 127/58 115/73 (!) 135/51 (!) 144/55  Pulse: (!) 58 81 78 77  Resp: (!) 21 (!) 21 (!) 22 (!) 23  Temp:  99.4 F (37.4 C) 97.9 F (36.6 C)   TempSrc:  Oral Oral   SpO2: 94% 93% 96% 94%  Weight:      Height:      PainSc:        Isolation Precautions No active isolations  Medications Medications  azithromycin (ZITHROMAX) 500 mg in dextrose 5 % 250 mL IVPB (500 mg Intravenous New Bag/Given 01/15/23 2123)  iohexol (OMNIPAQUE) 350 MG/ML injection 75 mL (75 mLs Intravenous Contrast Given 01/15/23 1557)  cefTRIAXone (ROCEPHIN) 1 g in sodium chloride 0.9 % 100 mL IVPB (0 g Intravenous Stopped 01/15/23 2114)    Mobility non-ambulatory at this time-she is in rehab     Focused Assessments Pt has been altered, not herself.  She has been paranoid and has had some hallucinations (speaking to people not in the room)   R Recommendations: See Admitting Provider Note  Report given to:   Additional Notes:

## 2023-01-15 NOTE — ED Provider Notes (Signed)
4:56 PM Care assumed from Dr. Jeraldine Loots.  At time of transfer of care, patient is awaiting for results of CT head and urinalysis.  Due to altered mental status and the intermittent hypoxia on home oxygen, previous team suspects that patient will need admission for further monitoring and management.  Anticipate reassessment after workup is completed.  7:46 PM Patient CT head did not show concerning finding and urinalysis did not show UTI however when I went to go reassess the patient she was talking to the walls and the daughter is concerned about acute delirium and I agree.  Patient is warm to the touch and temperature was 99.4 now.  She is tachypneic and on oxygen and has had some coughing.  Her CT scan did not show pulm embolism but did show some bronchitis changes, I am concerned about some early pneumonia.  Spoke to pharmacy who recommended Rocephin and azithromycin given the low cross-reactivity with her known penicillin and sulfa allergy.  Will order antibiotics and admit for altered mental status, delirium, variable oxygen saturations, and early pneumonia.   Dredyn Gubbels, Canary Brim, MD 01/15/23 [phone]

## 2023-01-15 NOTE — H&P (Signed)
History and Physical    Valerie Bowman ZOX:[id number] DOB: 08/16/1935 DOA: 01/15/2023  PCP: Smiley Houseman, NP  Patient coming from: SNF  I have personally briefly reviewed patient's old medical records in [facility]  Chief Complaint: Altered mental status  HPI: Valerie Bowman is a 87 y.o. female with medical history significant for COPD, chronic respiratory failure with hypoxia and hypercapnia on 2 L O2 via [place] at baseline, chronic HFpEF, CKD stage IIIa, HTN, HLD, anemia, OSA on trilogy vent nightly who presented to the ED from SNF for evaluation of altered mental status.  History is supplemented by patient's daughter at bedside.  Patient recently admitted 01/09/2023-01/14/2023 for closed left ankle fracture occurring after mechanical fall.  She was seen by orthopedics who recommended conservative management.  Short leg cast was placed.  Patient also noted to have AKI with creatinine improved from 1.84-1.16 on discharge.  Lasix and HCTZ were held.  PT/OT recommended SNF and patient was discharged to SNF yesterday.  Daughter states that patient was given oxycodone last night for pain.  Patient uses trilogy vent at night at baseline.  She was not tolerating throughout the night and kept taking it off.  This morning she was noted to be excessively somnolent.  Daughter went into check on her and patient was minimally arousable.  She was brought to the ED for further evaluation.  Family states that patient has been hallucinating while in the ED, speaking to people who are not present.  She has been lashing out at family.  She had a similar episode in September when she had the COVID-19 viral infection which eventually resolved on its own.  Patient reports a chronic cough productive of white sputum which has not changed from baseline.  She denies chest pain, dyspnea, abdominal pain, dysuria, diarrhea.  ED Course  Labs/Imaging on admission: I have personally reviewed following labs and imaging  studies.  Initial vitals showed BP 120/49, pulse 80, RR 20, temp 99.2 F, SpO2 94% on 2 L O2 via [place].  Labs show sodium 138, potassium 4.1, bicarb 32, BUN 17, creatinine 0.99, serum glucose 104, LFTs within normal limits, WBC 9.6, hemoglobin 8.4, platelets 218,000, TSH 0.428, free T4 in process.  VBG pH 7.4, pCO2 56, pO2 41.  D-dimer 1.77.  Urinalysis negative for UTI.  LLE venous ultrasound negative for evidence of DVT.  CTA chest negative for evidence of central pulmonary embolus.  Moderate bilateral bronchial wall thickening seen.  Solid pulmonary nodules of the right upper and right middle lobe, possibly infectious, largest measures up to 13 mm noted.  Mildly enlarged bilateral hilar lymph nodes which are likely reactive.  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  CT head without contrast negative for acute or cranial pathology.  Mild age-related atrophy and chronic microvascular changes seen.  Patient was given IV ceftriaxone and azithromycin.  The hospitalist service was consulted to admit for further evaluation and management.  Review of Systems: All systems reviewed and are negative except as documented in history of present illness above.   Past Medical History:  Diagnosis Date   Acquired hypothyroidism    Chronic diastolic heart failure (HCC)    COPD (chronic obstructive pulmonary disease) (HCC)    Essential hypertension    Hyperlipidemia    Urinary incontinence     History reviewed. No pertinent surgical history.  Social History:  reports that she quit smoking about 29 years ago. Her smoking use included cigarettes. She started  smoking about 59 years ago. She has a 30 pack-year smoking history. She has never used smokeless tobacco. She reports that she does not currently use alcohol. She reports that she does not use drugs.  Allergies  Allergen Reactions   Duloxetine Hcl Other (See Comments)   Oxybutynin Other (See Comments)    Penicillins Other (See Comments)    Listed on MAR as allergy Unknown reaction    Sulfa Antibiotics Other (See Comments)    Listed on MAR as allergy Unknown reaction    Family History  Problem Relation Age of Onset   Lung disease Neg Hx      Prior to Admission medications   Medication Sig Start Date End Date Taking? Authorizing Provider  albuterol (PROVENTIL) (2.5 MG/3ML) 0.083% nebulizer solution Take 2.5 mg by nebulization as needed.    [provider]  albuterol (VENTOLIN HFA) 108 (90 Base) MCG/ACT inhaler Inhale 2 puffs into the lungs every 6 (six) hours as needed for wheezing or shortness of breath. 09/19/21   Charlott Holler, MD  aspirin EC 81 MG tablet Take 81 mg by mouth daily.    [provider]  benzonatate (TESSALON) 200 MG capsule Take 1 capsule (200 mg total) by mouth 3 (three) times daily as needed for cough. 01/15/22   Cobb, Ruby Cola, NP  cetirizine (ZYRTEC) 5 MG tablet Take 5 mg by mouth daily.    [provider]  dextromethorphan (DELSYM) 30 MG/5ML liquid Take 10 mLs by mouth every 12 (twelve) hours as needed for cough.    [provider]  diclofenac Sodium (VOLTAREN) 1 % GEL Apply 2 g topically every 6 (six) hours as needed (pain).    [provider]  enalapril (VASOTEC) 20 MG tablet Take 20 mg by mouth 2 (two) times daily. Hold for SBP < 110    [provider]  fluticasone (FLONASE) 50 MCG/ACT nasal spray Place 1 spray into both nostrils daily as needed for allergies or rhinitis.    [provider]  Fluticasone-Umeclidin-Vilant (TRELEGY ELLIPTA) 100-62.5-25 MCG/ACT AEPB Inhale 1 puff into the lungs daily. 12/19/21   Cobb, Ruby Cola, NP  Glycerin-Hypromellose-PEG 400 (ARTIFICIAL TEARS) 0.2-0.2-1 % SOLN Place 1 drop into both eyes 2 (two) times daily.    [provider]  guaiFENesin (MUCINEX) 600 MG 12 hr tablet Take 600 mg by mouth 2 (two) times daily.    [provider]  Multiple  Vitamins-Minerals (ALIVE WOMENS 50+ GUMMY PO) Take 1 Dose by mouth in the morning.    [provider]  oxyCODONE (OXY IR/ROXICODONE) 5 MG immediate release tablet Take 1 tablet (5 mg total) by mouth every 6 (six) hours as needed for moderate pain (pain score 4-6), breakthrough pain or severe pain (pain score 7-10). 01/14/23   Amin, Ankit C, MD  pantoprazole (PROTONIX) 40 MG tablet Take 1 tablet (40 mg total) by mouth 2 (two) times daily before a meal. 07/22/22   Charlott Holler, MD  polyethylene glycol (MIRALAX / GLYCOLAX) 17 g packet Take 17 g by mouth daily as needed for mild constipation or moderate constipation.    [provider]  senna (SENOKOT) 8.6 MG TABS tablet Take 1 tablet by mouth 2 (two) times daily.    [provider]  SYNTHROID 175 MCG tablet Take 175 mcg by mouth 2 (two) times daily. 09/30/22   [provider]  vitamin B-12 (CYANOCOBALAMIN) 500 MCG tablet Take 1 tablet (500 mcg total) by mouth daily. 08/26/21   Elgergawy,  Leana Roe, MD    Physical Exam: Vitals:   01/15/23 1728 01/15/23 2030 01/15/23 2100 01/15/23 2238  BP: 115/73 (!) 135/51 (!) 144/55 (!) 109/45  Pulse: 81 78 77 74  Resp: (!) 21 (!) 22 (!) 23 18  Temp: 99.4 F (37.4 C) 97.9 F (36.6 C)  98.8 F (37.1 C)  TempSrc: Oral Oral  Oral  SpO2: 93% 96% 94% 97%  Weight:      Height:       Constitutional: Elderly woman resting in bed, NAD, calm Eyes: PERRL, lids and conjunctivae normal ENMT: Mucous membranes are moist. Posterior pharynx clear of any exudate or lesions.Normal dentition.  Neck: normal, supple, no masses. Respiratory: clear to auscultation bilaterally, no wheezing, no crackles. Normal respiratory effort while on 2 L O2 via [place]. No accessory muscle use.  Cardiovascular: Regular rate and rhythm, no murmurs / rubs / gallops. No extremity edema. 2+ pedal pulses. Abdomen: no tenderness, no masses palpated.  Musculoskeletal: Short cast in place left lower extremity, patient  able to wiggle toes on left foot without difficulty Skin: no rashes, lesions, ulcers. No induration Neurologic: Sensation intact. Strength 5/5 in all 4.  Psychiatric: Alert and oriented to self, place, year, but not situation.   EKG: Personally reviewed. Sinus rhythm, rate 78, no acute ischemic changes.  Assessment/Plan Principal Problem:   Altered mental status Active Problems:   Chronic respiratory failure with hypoxia and hypercapnia (HCC)   COPD, severe (HCC)   Acquired hypothyroidism   Essential hypertension   OSA (obstructive sleep apnea)   Closed left ankle fracture   Chronic kidney disease, stage 3a (HCC)   Valerie Bowman is a 87 y.o. female with medical history significant for COPD, chronic respiratory failure with hypoxia and hypercapnia on 2 L O2 via [place] at baseline, chronic HFpEF, CKD stage IIIa, HTN, HLD, anemia, OSA on trilogy vent nightly who is admitted with altered mental status.  Assessment and Plan: Altered mental status/delirium: Presented from SNF after recent admission with altered mental status, delirium, reported hallucinations.  This could be multifactorial related to potential developing pulmonary infection, effect from narcotic pain meds, or or nonadherence to trilogy vent overnight.  UA negative for UTI. -Continue antibiotics with ceftriaxone/azithromycin -Continue trilogy vent nightly, use BiPAP until home equipment available  Right upper and middle lobe nodules Bibasilar atelectasis: CTA chest with RUL and RML pulmonary nodules, possibly infectious per radiology read as well as changes of bronchitis.  Potentially contributing to presenting symptoms. -Continue empiric IV ceftriaxone and azithromycin -Check COVID and RVP -Check strep pneumonia and Legionella urinary antigens -Incentive spirometer, flutter valve  COPD with chronic hypoxic and hypercapnic respiratory failure: Stable on home 2 L O2 via [place] without signs of COPD exacerbation on  admission. -Continue Trelegy Ellipta and albuterol as needed -Continue supplemental oxygen  OSA: Continue trilogy vent nightly, use BiPAP until home equipment available.  Closed left ankle fracture: Recent admit for this, seen by orthopedics who recommended conservative management.  Short leg splint in place.  Orthopedics have recommended NWB to LLE for next 4-6 weeks and follow-up in office midweek next week.  Hypertension: Continue enalapril.  CKD stage IIIa: Stable.  Normocytic anemia: Hemoglobin stable.  Continue B12 supplement.  Hypothyroidism: Continue Synthroid.  TSH 0.428.   DVT prophylaxis: Lovenox Code Status: DNR/DNI, confirmed with daughter on admission Family Communication: Daughter at bedside Disposition Plan: From SNF, discharge pending clinical progress Consults called: None Severity of Illness: The appropriate patient status for this patient is OBSERVATION. Observation  status is judged to be reasonable and necessary in order to provide the required intensity of service to ensure the patient's safety. The patient's presenting symptoms, physical exam findings, and initial radiographic and laboratory data in the context of their medical condition is felt to place them at decreased risk for further clinical deterioration. Furthermore, it is anticipated that the patient will be medically stable for discharge from the hospital within 2 midnights of admission.   Darreld Mclean MD [facility]  If 7PM-7AM, please contact night-coverage [email or web address]  01/15/2023, 11:29 PM

## 2023-01-15 NOTE — ED Provider Notes (Signed)
[place] EMERGENCY DEPARTMENT AT [facility] Provider Note   CSN: [id number] Arrival date & time: 01/15/23  1249     History  Chief Complaint  Patient presents with   Altered Mental Status    Valerie Bowman is a 87 y.o. female.  HPI Patient presents via EMS with concern for fatigue, or somnolence. Patient has multiple medical issues including COPD, and had a fall with ankle fracture recently.  Patient went to have yesterday after surgical repair for ankle fracture.  Staff notes that today patient was more hypersomnolent than prior, with slowness answering questions.  The patient herself awakens easily, denies any complaints, states that she is unsure why she is here.  EMS reports no hemodynamic instability, patient sluggish, but answering questions appropriately in transport.    Home Medications Prior to Admission medications   Medication Sig Start Date End Date Taking? Authorizing Provider  albuterol (PROVENTIL) (2.5 MG/3ML) 0.083% nebulizer solution Take 2.5 mg by nebulization as needed.    [provider]  albuterol (VENTOLIN HFA) 108 (90 Base) MCG/ACT inhaler Inhale 2 puffs into the lungs every 6 (six) hours as needed for wheezing or shortness of breath. 09/19/21   Charlott Holler, MD  aspirin EC 81 MG tablet Take 81 mg by mouth daily.    [provider]  benzonatate (TESSALON) 200 MG capsule Take 1 capsule (200 mg total) by mouth 3 (three) times daily as needed for cough. 01/15/22   Cobb, Ruby Cola, NP  cetirizine (ZYRTEC) 5 MG tablet Take 5 mg by mouth daily.    [provider]  dextromethorphan (DELSYM) 30 MG/5ML liquid Take 10 mLs by mouth every 12 (twelve) hours as needed for cough.    [provider]  diclofenac Sodium (VOLTAREN) 1 % GEL Apply 2 g topically every 6 (six) hours as needed (pain).    [provider]  enalapril (VASOTEC) 20 MG tablet Take 20 mg by mouth 2 (two) times daily. Hold for SBP < 110    [provider]  fluticasone (FLONASE) 50 MCG/ACT nasal spray Place 1 spray into both nostrils daily as needed for allergies or rhinitis.    [provider]  Fluticasone-Umeclidin-Vilant (TRELEGY ELLIPTA) 100-62.5-25 MCG/ACT AEPB Inhale 1 puff into the lungs daily. 12/19/21   Cobb, Ruby Cola, NP  Glycerin-Hypromellose-PEG 400 (ARTIFICIAL TEARS) 0.2-0.2-1 % SOLN Place 1 drop into both eyes 2 (two) times daily.    [provider]  guaiFENesin (MUCINEX) 600 MG 12 hr tablet Take 600 mg by mouth 2 (two) times daily.    [provider]  Multiple Vitamins-Minerals (ALIVE WOMENS 50+ GUMMY PO) Take 1 Dose by mouth in the morning.    [provider]  oxyCODONE (OXY IR/ROXICODONE) 5 MG immediate release tablet Take 1 tablet (5 mg total) by mouth every 6 (six) hours as needed for moderate pain (pain score 4-6), breakthrough pain or severe pain (pain score 7-10). 01/14/23   Amin, Ankit C, MD  pantoprazole (PROTONIX) 40 MG tablet Take 1 tablet (40 mg total) by mouth 2 (two) times daily before a meal. 07/22/22   Charlott Holler, MD  polyethylene glycol (MIRALAX / GLYCOLAX) 17 g packet Take 17 g by mouth daily as needed for mild constipation or moderate constipation.    [provider]  senna (SENOKOT) 8.6 MG TABS tablet Take 1 tablet by mouth 2 (two) times daily.    [provider]  SYNTHROID 175 MCG tablet Take 175 mcg by mouth 2 (  two) times daily. 09/30/22   [provider]  vitamin B-12 (CYANOCOBALAMIN) 500 MCG tablet Take 1 tablet (500 mcg total) by mouth daily. 08/26/21   Elgergawy, Leana Roe, MD      Allergies    Duloxetine hcl, Oxybutynin, Penicillins, and Sulfa antibiotics    Review of Systems   Review of Systems  Physical Exam Updated Vital Signs BP (!) 127/58   Pulse (!) 58   Temp 99.2 F (37.3 C) (Oral)   Resp (!) 21   Ht 5\' 6"  (1.676 m)   Wt 117.2 kg   SpO2 94%   BMI 41.70 kg/m  Physical Exam Vitals and nursing note reviewed.   Constitutional:      General: She is not in acute distress.    Appearance: She is well-developed. She is obese. She is ill-appearing. She is not toxic-appearing or diaphoretic.  HENT:     Head: Normocephalic and atraumatic.  Eyes:     Conjunctiva/sclera: Conjunctivae normal.  Cardiovascular:     Rate and Rhythm: Normal rate and regular rhythm.  Pulmonary:     Effort: Pulmonary effort is normal. No respiratory distress.     Breath sounds: Normal breath sounds. No stridor.  Abdominal:     General: There is no distension.  Musculoskeletal:     Comments: Left distal lower extremity in cast, patient moves the toes appropriately has appropriate cap refill and color both distal and proximal.  Skin:    General: Skin is warm and dry.  Neurological:     Mental Status: She is alert and oriented to person, place, and time.     Cranial Nerves: No cranial nerve deficit.  Psychiatric:        Mood and Affect: Mood normal.     ED Results / Procedures / Treatments   Labs (all labs ordered are listed, but only abnormal results are displayed) Labs Reviewed  COMPREHENSIVE METABOLIC PANEL - Abnormal; Notable for the following components:      Result Value   Chloride 97 (*)    Glucose, Bld 104 (*)    Albumin 2.6 (*)    GFR, Estimated 56 (*)    All other components within normal limits  CBC WITH DIFFERENTIAL/PLATELET - Abnormal; Notable for the following components:   RBC 2.82 (*)    Hemoglobin 8.4 (*)    HCT 27.1 (*)    RDW 15.8 (*)    nRBC 0.3 (*)    Monocytes Absolute 1.2 (*)    All other components within normal limits  D-DIMER, QUANTITATIVE - Abnormal; Notable for the following components:   D-Dimer, Quant 1.77 (*)    All other components within normal limits  BLOOD GAS, VENOUS - Abnormal; Notable for the following components:   Bicarbonate 34.7 (*)    Acid-Base Excess 8.7 (*)    All other components within normal limits  TSH  T4, FREE  URINALYSIS, ROUTINE W REFLEX MICROSCOPIC     EKG EKG Interpretation Date/Time:  Wednesday January 15 2023 13:02:39 EST Ventricular Rate:  78 PR Interval:  197 QRS Duration:  103 QT Interval:  372 QTC Calculation: 424 R Axis:   41  Text Interpretation: Sinus rhythm Artifact Confirmed by Gerhard Munch [phone]) on 01/15/2023 2:08:10 PM  Radiology CT Angio Chest PE W/Cm &/Or Wo Cm  Result Date: 01/15/2023 CLINICAL DATA:  Mental status change EXAM: CT ANGIOGRAPHY CHEST WITH CONTRAST TECHNIQUE: Multidetector CT imaging of the chest was performed using the standard protocol during bolus administration of intravenous contrast.  Multiplanar CT image reconstructions and MIPs were obtained to evaluate the vascular anatomy. RADIATION DOSE REDUCTION: This exam was performed according to the departmental dose-optimization program which includes automated exposure control, adjustment of the mA and/or kV according to patient size and/or use of iterative reconstruction technique. CONTRAST:  75mL OMNIPAQUE IOHEXOL 350 MG/ML SOLN COMPARISON:  None Available. FINDINGS: Cardiovascular: No evidence of central pulmonary embolus. Limits evaluation of the segmental and subsegmental pulmonary arteries due to bolus timing and motion artifact. Mild cardiomegaly. Normal caliber thoracic aorta with moderate atherosclerotic disease. Mild coronary artery calcifications. Mediastinum/Nodes: Esophagus thyroid are unremarkable. Mildly enlarged bilateral hilar lymph nodes which are likely reactive. Reference right hilar lymph node measuring 13 mm in short axis on series 6, image 52. Lungs/Pleura: Central airways are patent. Moderate bilateral bronchial wall thickening. Clustered solid nodules of the right upper lobe largest measures 13 x 8 mm on series 14, image 58. Additional solid nodule of the right middle lobe measuring 7 mm on series 14, image 84. Left-greater-than-right atelectasis of the lower lobes. No pleural effusion or pneumothorax. 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 Musculoskeletal: No chest wall abnormality. No acute or significant osseous findings. Review of the MIP images confirms the above findings. IMPRESSION: 1. No evidence of central pulmonary embolus. Limits evaluation of the segmental and subsegmental pulmonary arteries due to bolus timing and motion artifact. 2. Moderate bilateral bronchial wall thickening, findings can be seen in the setting of bronchitis. 3. Solid pulmonary nodules of the right upper lobe and right middle lobe, possibly infectious, largest measures up to 13 mm. Follow-up chest CT is recommended in 3 months to ensure resolution. 4. Mildly enlarged bilateral hilar lymph nodes which are likely reactive. 5. 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 6. Aortic Atherosclerosis (ICD10-I70.0). Electronically Signed   By: Allegra Lai M.D.   On: 01/15/2023 16:35   DG Chest Port 1 View  Result Date: 01/15/2023 CLINICAL DATA:  Fatigue. EXAM: PORTABLE CHEST 1 VIEW COMPARISON:  January 09, 2023. FINDINGS: Stable cardiomegaly. Minimal bibasilar subsegmental atelectasis is noted. The visualized skeletal structures are unremarkable. IMPRESSION: Minimal bibasilar subsegmental atelectasis. Electronically Signed   By: Lupita Raider M.D.   On: 01/15/2023 16:26   VAS [name] LOWER EXTREMITY VENOUS (DVT) (7a-7p)  Result Date: 01/15/2023  Lower Venous DVT Study Patient Name:  ANNALEIA PENCE  Date of Exam:   01/15/2023 Medical Rec #: [id number]   Accession #:    [id number] Date of Birth: [birth year]    Patient Gender: F Patient Age:   13 years Exam Location:  [facility] Procedure:      VAS [name] LOWER EXTREMITY VENOUS (DVT) Referring Phys: Molly Maduro Keyonda Bickle --------------------------------------------------------------------------------  Indications: Edema.  Risk Factors: Surgery Trauma. Limitations: Body habitus, poor ultrasound/tissue interface and patient positioning, patient  immobility, patient pain tolerance, hard cast. Comparison Study: No prior studies. Performing Technologist: Chanda Busing RVT  Examination Guidelines: A complete evaluation includes B-mode imaging, spectral Doppler, color Doppler, and power Doppler as needed of all accessible portions of each vessel. Bilateral testing is considered an integral part of a complete examination. Limited examinations for reoccurring indications may be performed as noted. The reflux portion of the exam is performed with the patient in reverse Trendelenburg.  +-----+---------------+---------+-----------+----------+--------------+ RIGHTCompressibilityPhasicitySpontaneityPropertiesThrombus Aging +-----+---------------+---------+-----------+----------+--------------+ CFV  Full           Yes      Yes                                 +-----+---------------+---------+-----------+----------+--------------+   +---------+---------------+---------+-----------+----------+-------------------+  LEFT     CompressibilityPhasicitySpontaneityPropertiesThrombus Aging      +---------+---------------+---------+-----------+----------+-------------------+ CFV      Full           Yes      Yes                                      +---------+---------------+---------+-----------+----------+-------------------+ SFJ      Full                                                             +---------+---------------+---------+-----------+----------+-------------------+ FV Prox  Full                                                             +---------+---------------+---------+-----------+----------+-------------------+ FV Mid                  Yes      Yes                                      +---------+---------------+---------+-----------+----------+-------------------+ FV Distal               Yes      Yes                                       +---------+---------------+---------+-----------+----------+-------------------+ POP                     Yes      Yes                                      +---------+---------------+---------+-----------+----------+-------------------+ PTV      Full                                                             +---------+---------------+---------+-----------+----------+-------------------+ PERO                                                  Not well visualized +---------+---------------+---------+-----------+----------+-------------------+    Summary: RIGHT: - No evidence of common femoral vein obstruction.   LEFT: - There is no evidence of deep vein thrombosis in the lower extremity. However, portions of this examination were limited- see technologist comments above.  - No cystic structure found in the popliteal fossa.  *See table(s) above for measurements and observations.    Preliminary     Procedures Procedures    Medications Ordered in ED Medications  iohexol (  OMNIPAQUE) 350 MG/ML injection 75 mL (75 mLs Intravenous Contrast Given 01/15/23 1557)    ED Course/ Medical Decision Making/ A&P                                 Medical Decision Making Elderly female, obese, with multiple medical problems presents with fatigue from nursing facility after transition care following surgery yesterday.  Patient has limited insight into her current presentation, but denies pain.  And transfer the patient required 2 L supplemental oxygen due to possible hypoxia, and given her temporal proximity to surgery, prior differential, pneumonia, PE, electrolyte abnormalities, dehydration, progression of aging all considered. Cardiac 75 sinus normal Pulse ox 100% 2 L nasal cannula abnormal   Amount and/or Complexity of Data Reviewed Independent Historian: EMS Labs: ordered. Decision-making details documented in ED Course. Radiology: ordered and independent interpretation  performed. ECG/medicine tests: ordered and independent interpretation performed. Decision-making details documented in ED Course.  Risk Prescription drug management.   5:06 PM Patient accompanied by her daughter.  Daughter notes the patient is not interacting in a typical manner, was not so earlier today when she first encountered the patient prior to transfer here. Some suspicion for the patient's COPD contributing to this as the patient was noted to have hypoxia, not been on the appropriate amount of oxygen as well.  The patient remains in similar condition, withdrawn, but oriented.  Labs thus far notable for dimer as above, patient awaiting CT, head, pulmonary embolism evaluation, repeat evaluation Likely admission given her altered mental status.        Final Clinical Impression(s) / ED Diagnoses Final diagnoses:  Delirium    Rx / DC Orders ED Discharge Orders     None         Gerhard Munch, MD 01/15/23 1707

## 2023-01-15 NOTE — ED Triage Notes (Signed)
The pt was bib EMS from [facility]. The pt was just admitted last night. The staff called and reported pt was confused last night but today she is really sleepy. Pt reports not having any sleep in 2 days. EMS reports pt A&Ox4. They pts O2 sats were 88%RA so she was placed on O2 at 2L/[place] and sats are 94%. VS B/P 122/60, P 60, CBG 120, O2 Sats 94% on O2@2L /[place].

## 2023-01-15 NOTE — Progress Notes (Signed)
Patient is refusing COVID 19 swab and 20 pathogen respiratory panel swab. Patient states "you all need to look back in my chart, I was just tested 4-5 days ago and I will not be testing again, it is my right to refuse." RN explained to patient that even though she may have been tested 4-5 days ago that this is a new admission requiring more testing and providers have to place new ordered a perform their own testing for various reasons for this new admission. Patient still refuses. MD made aware.

## 2023-01-15 NOTE — ED Notes (Signed)
Purewick is on pt, daughter is at the bedside

## 2023-01-15 NOTE — ED Notes (Signed)
Changed pt's wet brief.  

## 2023-01-15 NOTE — Hospital Course (Signed)
Valerie Bowman is a 87 y.o. female with medical history significant for COPD, chronic respiratory failure with hypoxia and hypercapnia on 2 L O2 via [place] at baseline, chronic HFpEF, CKD stage IIIa, HTN, HLD, anemia, OSA on trilogy vent nightly who is admitted with altered mental status.

## 2023-01-15 NOTE — Progress Notes (Signed)
Left lower extremity venous duplex has been completed. Preliminary results can be found in CV Proc through chart review.  Results were given to Dr. Jeraldine Loots.  01/15/23 3:15 PM Olen Cordial RVT

## 2023-01-15 NOTE — Progress Notes (Signed)
Patient does not have her Trilogy machine with her. RT tried to have her wear our machine and but she refused. RN aware.

## 2023-01-15 NOTE — ED Notes (Signed)
Pt daughter has been attentive at the bedside since pt arrival.  Pt appears paranoid and confused and getting worked up.  Daughter continues to be very patient and attentive to patient.

## 2023-01-16 DIAGNOSIS — S82892G Other fracture of left lower leg, subsequent encounter for closed fracture with delayed healing: Secondary | ICD-10-CM

## 2023-01-16 DIAGNOSIS — I5032 Chronic diastolic (congestive) heart failure: Secondary | ICD-10-CM | POA: Diagnosis present

## 2023-01-16 DIAGNOSIS — D631 Anemia in chronic kidney disease: Secondary | ICD-10-CM | POA: Diagnosis present

## 2023-01-16 DIAGNOSIS — Z1152 Encounter for screening for COVID-19: Secondary | ICD-10-CM | POA: Diagnosis not present

## 2023-01-16 DIAGNOSIS — J440 Chronic obstructive pulmonary disease with acute lower respiratory infection: Secondary | ICD-10-CM | POA: Diagnosis present

## 2023-01-16 DIAGNOSIS — G4733 Obstructive sleep apnea (adult) (pediatric): Secondary | ICD-10-CM | POA: Diagnosis not present

## 2023-01-16 DIAGNOSIS — J9612 Chronic respiratory failure with hypercapnia: Secondary | ICD-10-CM

## 2023-01-16 DIAGNOSIS — J449 Chronic obstructive pulmonary disease, unspecified: Secondary | ICD-10-CM

## 2023-01-16 DIAGNOSIS — W19XXXA Unspecified fall, initial encounter: Secondary | ICD-10-CM | POA: Diagnosis present

## 2023-01-16 DIAGNOSIS — I130 Hypertensive heart and chronic kidney disease with heart failure and stage 1 through stage 4 chronic kidney disease, or unspecified chronic kidney disease: Secondary | ICD-10-CM | POA: Diagnosis present

## 2023-01-16 DIAGNOSIS — S82832A Other fracture of upper and lower end of left fibula, initial encounter for closed fracture: Secondary | ICD-10-CM | POA: Diagnosis present

## 2023-01-16 DIAGNOSIS — D519 Vitamin B12 deficiency anemia, unspecified: Secondary | ICD-10-CM | POA: Diagnosis present

## 2023-01-16 DIAGNOSIS — E039 Hypothyroidism, unspecified: Secondary | ICD-10-CM

## 2023-01-16 DIAGNOSIS — I10 Essential (primary) hypertension: Secondary | ICD-10-CM | POA: Diagnosis not present

## 2023-01-16 DIAGNOSIS — R918 Other nonspecific abnormal finding of lung field: Secondary | ICD-10-CM

## 2023-01-16 DIAGNOSIS — E66813 Obesity, class 3: Secondary | ICD-10-CM | POA: Diagnosis present

## 2023-01-16 DIAGNOSIS — R4182 Altered mental status, unspecified: Secondary | ICD-10-CM | POA: Diagnosis present

## 2023-01-16 DIAGNOSIS — N1831 Chronic kidney disease, stage 3a: Secondary | ICD-10-CM

## 2023-01-16 DIAGNOSIS — Z8616 Personal history of COVID-19: Secondary | ICD-10-CM | POA: Diagnosis not present

## 2023-01-16 DIAGNOSIS — R1312 Dysphagia, oropharyngeal phase: Secondary | ICD-10-CM | POA: Diagnosis present

## 2023-01-16 DIAGNOSIS — I728 Aneurysm of other specified arteries: Secondary | ICD-10-CM | POA: Diagnosis present

## 2023-01-16 DIAGNOSIS — Z6841 Body Mass Index (BMI) 40.0 and over, adult: Secondary | ICD-10-CM | POA: Diagnosis not present

## 2023-01-16 DIAGNOSIS — E785 Hyperlipidemia, unspecified: Secondary | ICD-10-CM | POA: Diagnosis present

## 2023-01-16 DIAGNOSIS — Z66 Do not resuscitate: Secondary | ICD-10-CM | POA: Diagnosis present

## 2023-01-16 DIAGNOSIS — T402X5A Adverse effect of other opioids, initial encounter: Secondary | ICD-10-CM | POA: Diagnosis present

## 2023-01-16 DIAGNOSIS — J9611 Chronic respiratory failure with hypoxia: Secondary | ICD-10-CM | POA: Diagnosis present

## 2023-01-16 DIAGNOSIS — G9341 Metabolic encephalopathy: Secondary | ICD-10-CM | POA: Diagnosis present

## 2023-01-16 DIAGNOSIS — G928 Other toxic encephalopathy: Secondary | ICD-10-CM | POA: Diagnosis present

## 2023-01-16 DIAGNOSIS — J189 Pneumonia, unspecified organism: Secondary | ICD-10-CM | POA: Diagnosis present

## 2023-01-16 DIAGNOSIS — R443 Hallucinations, unspecified: Secondary | ICD-10-CM | POA: Diagnosis present

## 2023-01-16 DIAGNOSIS — Z9981 Dependence on supplemental oxygen: Secondary | ICD-10-CM | POA: Diagnosis not present

## 2023-01-16 LAB — RESPIRATORY PANEL BY PCR

## 2023-01-16 LAB — BASIC METABOLIC PANEL
Anion gap: 11 (ref 5–15)
BUN: 15 mg/dL (ref 8–23)
CO2: 32 mmol/L (ref 22–32)
Calcium: 8.9 mg/dL (ref 8.9–10.3)
Chloride: 96 mmol/L — ABNORMAL LOW (ref 98–111)
Creatinine, Ser: 0.72 mg/dL (ref 0.44–1.00)
GFR, Estimated: >60 mL/min (ref >60)
Glucose, Bld: 171 mg/dL — ABNORMAL HIGH (ref 70–99)
Potassium: 3.4 mmol/L — ABNORMAL LOW (ref 3.5–5.1)
Sodium: 139 mmol/L (ref 135–145)

## 2023-01-16 LAB — CBC
HCT: 28 % — ABNORMAL LOW (ref 36.0–46.0)
Hemoglobin: 8.7 g/dL — ABNORMAL LOW (ref 12.0–15.0)
MCH: 29.7 pg (ref 26.0–34.0)
MCHC: 31.1 g/dL (ref 30.0–36.0)
MCV: 95.6 fL (ref 80.0–100.0)
Platelets: 271 10*3/uL (ref 150–400)
RBC: 2.93 MIL/uL — ABNORMAL LOW (ref 3.87–5.11)
RDW: 15.5 % (ref 11.5–15.5)
WBC: 11.2 10*3/uL — ABNORMAL HIGH (ref 4.0–10.5)
nRBC: 0 % (ref 0.0–0.2)

## 2023-01-16 LAB — SARS CORONAVIRUS 2 BY RT PCR: SARS Coronavirus 2 by RT PCR: NEGATIVE

## 2023-01-16 LAB — STREP PNEUMONIAE URINARY ANTIGEN: Strep Pneumo Urinary Antigen: NEGATIVE

## 2023-01-16 LAB — GLUCOSE, CAPILLARY: Glucose-Capillary: 105 mg/dL — ABNORMAL HIGH (ref 70–99)

## 2023-01-16 MED ORDER — LEVOTHYROXINE SODIUM 25 MCG PO TABS
175.0000 ug | ORAL_TABLET | Freq: Every day | ORAL | Status: DC
  Administered 2023-01-16 – 2023-01-21 (×6): 175 ug via ORAL
  Filled 2023-01-16 (×6): qty 1

## 2023-01-16 MED ORDER — SODIUM CHLORIDE 0.9 % IV SOLN: INTRAVENOUS | Status: AC

## 2023-01-16 MED ORDER — IPRATROPIUM-ALBUTEROL 0.5-2.5 (3) MG/3ML IN SOLN
3.0000 mL | Freq: Three times a day (TID) | RESPIRATORY_TRACT | Status: DC
  Administered 2023-01-16 – 2023-01-17 (×3): 3 mL via RESPIRATORY_TRACT
  Filled 2023-01-16 (×3): qty 3

## 2023-01-16 NOTE — [place] FL2 (Signed)
[place] MEDICAID FL2 LEVEL OF CARE FORM     IDENTIFICATION  Patient Name: Valerie Bowman Birthdate: 12-19-1935 Sex: female Admission Date (Current Location): 01/15/2023  [facility] and [place] Number:  Producer, television/film/video and Address:  [facility],  [address] [place], [place] [address]      Provider Number: [id number]  Attending Physician Name and Address:  Rodolph Bong, MD  Relative Name and Phone Number:  Buel Ream (daughter) Ph: [phone]    Current Level of Care: Hospital Recommended Level of Care: Skilled Nursing Facility Prior Approval Number:    Date Approved/Denied:   PASRR Number: [id number]  Discharge Plan: SNF    Current Diagnoses: Patient Active Problem List   Diagnosis Date Noted   Acute metabolic encephalopathy 01/16/2023   AMS (altered mental status) 01/16/2023   Altered mental status 01/15/2023   Chronic kidney disease, stage 3a (HCC) 01/15/2023   Closed left ankle fracture 01/10/2023   Chronic cough 01/15/2022   OSA (obstructive sleep apnea) 12/19/2021   Chronic respiratory failure with hypoxia and hypercapnia (HCC) 08/23/2021   Pain in right shoulder 03/20/2021   SOB (shortness of breath) 02/28/2021   Acquired hypothyroidism    Essential hypertension    Chronic diastolic heart failure (HCC)    COPD, severe (HCC) 02/27/2021   Low back pain 02/20/2021    Orientation RESPIRATION BLADDER Height & Weight     Self, Time, Place  O2 (2L/min) Incontinent Weight: 258 lb 6.1 oz (117.2 kg) Height:  5\' 6"  (167.6 cm)  BEHAVIORAL SYMPTOMS/MOOD NEUROLOGICAL BOWEL NUTRITION STATUS      Incontinent Diet (Heart healthy)  AMBULATORY STATUS COMMUNICATION OF NEEDS Skin   Extensive Assist Verbally Normal                       Personal Care Assistance Level of Assistance  Bathing, Feeding, Dressing Bathing Assistance: Maximum assistance Feeding assistance: Independent Dressing Assistance: Maximum assistance     Functional  Limitations Info  Sight, Hearing, Speech Sight Info: Impaired Hearing Info: Impaired Speech Info: Adequate    SPECIAL CARE FACTORS FREQUENCY  PT (By licensed PT), OT (By licensed OT)     PT Frequency: 5x/wk OT Frequency: 5x/wk            Contractures Contractures Info: Not present    Additional Factors Info  Code Status, Allergies Code Status Info: DNR Allergies Info: Duloxetine Hcl, Oxybutynin, Penicillins, Sulfa Antibiotics           Current Medications (01/16/2023):  This is the current hospital active medication list Current Facility-Administered Medications  Medication Dose Route Frequency Provider Last Rate Last Admin   0.9 %  sodium chloride infusion   Intravenous Continuous Rodolph Bong, MD 75 mL/hr at 01/16/23 1159 Rate Change at 01/16/23 1159   acetaminophen (TYLENOL) tablet 650 mg  650 mg Oral Q6H PRN Charlsie Quest, MD       Or   acetaminophen (TYLENOL) suppository 650 mg  650 mg Rectal Q6H PRN Charlsie Quest, MD       albuterol (PROVENTIL) (2.5 MG/3ML) 0.083% nebulizer solution 2.5 mg  2.5 mg Nebulization Q6H PRN Charlsie Quest, MD       azithromycin (ZITHROMAX) tablet 500 mg  500 mg Oral Daily Patel, Vishal R, MD       bisacodyl (DULCOLAX) EC tablet 5 mg  5 mg Oral Daily PRN Charlsie Quest, MD       cefTRIAXone (ROCEPHIN) 2 g in sodium  chloride 0.9 % 100 mL IVPB  2 g Intravenous Q24H Darreld Mclean R, MD       cyanocobalamin (VITAMIN B12) tablet 500 mcg  500 mcg Oral Daily Darreld Mclean R, MD   500 mcg at 01/16/23 0849   enalapril (VASOTEC) tablet 20 mg  20 mg Oral BID Darreld Mclean R, MD   20 mg at 01/16/23 0849   enoxaparin (LOVENOX) injection 55 mg  55 mg Subcutaneous Q24H Darreld Mclean R, MD   55 mg at 01/16/23 0854   fluticasone furoate-vilanterol (BREO ELLIPTA) 100-25 MCG/ACT 1 puff  1 puff Inhalation Daily Charlsie Quest, MD   1 puff at 01/16/23 0812   ipratropium-albuterol (DUONEB) 0.5-2.5 (3) MG/3ML nebulizer solution 3 mL  3 mL Nebulization  TID Rodolph Bong, MD       levothyroxine (SYNTHROID) tablet 175 mcg  175 mcg Oral Q0600 Rodolph Bong, MD   175 mcg at 01/16/23 1257   ondansetron (ZOFRAN) tablet 4 mg  4 mg Oral Q6H PRN Charlsie Quest, MD       Or   ondansetron (ZOFRAN) injection 4 mg  4 mg Intravenous Q6H PRN Charlsie Quest, MD       pantoprazole (PROTONIX) EC tablet 40 mg  40 mg Oral BID AC Darreld Mclean R, MD   40 mg at 01/16/23 0848   senna (SENOKOT) tablet 8.6 mg  1 tablet Oral BID Darreld Mclean R, MD   8.6 mg at 01/16/23 0848   senna-docusate (Senokot-S) tablet 1 tablet  1 tablet Oral QHS PRN Charlsie Quest, MD         Discharge Medications: Please see discharge summary for a list of discharge medications.  Relevant Imaging Results:  Relevant Lab Results:   Additional Information SSN: [id number]  Otelia Santee, LCSW

## 2023-01-16 NOTE — Plan of Care (Signed)
  Problem: Clinical Measurements: Goal: Ability to maintain a body temperature in the normal range will improve Outcome: Progressing   Problem: Respiratory: Goal: Ability to maintain adequate ventilation will improve Outcome: Progressing Goal: Ability to maintain a clear airway will improve Outcome: Progressing   Problem: Health Behavior/Discharge Planning: Goal: Ability to manage health-related needs will improve Outcome: Progressing   Problem: Clinical Measurements: Goal: Will remain free from infection Outcome: Progressing Goal: Diagnostic test results will improve Outcome: Progressing   Problem: Activity: Goal: Risk for activity intolerance will decrease Outcome: Progressing

## 2023-01-16 NOTE — Progress Notes (Addendum)
PROGRESS NOTE    Valerie Bowman  UXL:[id number] DOB: August 20, 1935 DOA: 01/15/2023 PCP: Smiley Houseman, NP    Chief Complaint  Patient presents with   Altered Mental Status    Brief Narrative:  Patient is a 87 year old female history of COPD, chronic respiratory failure with hypoxia and hypercapnia on 2 L nasal cannula at baseline, chronic HFpEF, CKD stage IIIa, hypertension, hyperlipidemia, anemia, OSA on trilogy vent nightly presented to the ED with altered mental status after recent discharge hospitalization 01/09/2023-01/14/2023 for close left ankle fracture after mechanical fall.   Assessment & Plan:   Principal Problem:   Altered mental status Active Problems:   Chronic respiratory failure with hypoxia and hypercapnia (HCC)   COPD, severe (HCC)   Acquired hypothyroidism   Essential hypertension   OSA (obstructive sleep apnea)   Closed left ankle fracture   Chronic kidney disease, stage 3a (HCC)   Acute metabolic encephalopathy   AMS (altered mental status)   Abnormal chest x-ray with multiple lung nodules  #1 altered mental status -Likely multifactorial secondary to potential pulmonary infection, narcotic pain medications, possible nonadherence to trilogy vent overnight. -Patient noted to have reported some hallucinations. -Patient more alert today oriented to self place and time.  Patient knows who the president is currently and who the incoming president is going to be. -Urinalysis nitrite negative, leukocytes negative. -Urine strep pneumococcus antigen is negative. -CT head negative for any acute abnormalities. -CT angiogram chest negative for PE, moderate bilateral bronchial wall thickening can be seen in setting of bronchitis, solid pulmonary nodules in the right upper lobe and right middle lobe possibly infectious follow-up CT chest recommended 3 months to ensure resolution, mildly enlarged bilateral hilar lymph nodes likely reactive. -Patient noted to have been on  oxycodone prior to admission. -Continue empiric IV antibiotics for possible pulmonary infection of Rocephin and azithromycin.   -Continue BiPAP nightly -IV fluids, supportive care.  2.  Right upper lobe and right middle lobe nodule/bibasilar atelectasis -CT angiogram chest with right upper lobe and right middle lobe pulmonary nodules possibly infectious as well as changes of bronchitis noted. -Urine strep pneumococcus antigen negative. -Urine Legionella antigen pending. -Respiratory viral panel negative. -SARS coronavirus 2 PCR negative. -Continue empiric IV Rocephin/azithromycin. -Patient with some wheezing noted and as such we will place on scheduled DuoNebs. -Supportive care.  3.  Chronic hypoxic and hypercapnic respiratory failure/COPD -Patient at baseline on 2 L home O2. -Patient with some wheezing noted on examination. -Place on scheduled DuoNebs, PPI. -Discontinue Incruse Ellipta. -Continue Breo ellipta. -BiPAP nightly -Supportive care.  4.  OSA -Patient noted to be on trilogy vent nightly. -BiPAP nightly while in-house.  5.  Closed left ankle fracture -Patient recently admitted for closed left ankle fracture and noted to have been assessed by orthopedics who recommended conservative management at that time. -Patient currently in short leg splint. -Orthopedics recommended NWB of left lower extremity for 4 to 6 weeks with follow-up in the office midweek next week. -DC oxycodone as felt may be contributing to patient's altered mental status. -PT/OT.  6.  CKD stage IIIa -Stable.  7.  Hypertension -Continue enalapril.  8.  Hypothyroidism -Continue Synthroid.  9.  Normocytic anemia -Hemoglobin currently stable at 8.7. -No overt bleeding. -Follow.   DVT prophylaxis: Lovenox Code Status: DNR Family Communication: Updated daughter at bedside. Disposition: Likely SNF  Status is: Inpatient The patient will require care spanning > 2 midnights and should be moved to  inpatient because: Severity of illness   Consultants:  None  Procedures:  CT angiogram chest 01/15/2023 CT head 01/15/2023 Chest x-ray 01/15/2023  Antimicrobials:  Anti-infectives (From admission, onward)    Start     Dose/Rate Route Frequency Ordered Stop   01/16/23 2200  azithromycin (ZITHROMAX) tablet 500 mg        500 mg Oral Daily 01/15/23 2144 01/21/23 2159   01/16/23 2000  cefTRIAXone (ROCEPHIN) 2 g in sodium chloride 0.9 % 100 mL IVPB        2 g 200 mL/hr over 30 Minutes Intravenous Every 24 hours 01/15/23 2144 01/21/23 1959   01/15/23 2000  cefTRIAXone (ROCEPHIN) 1 g in sodium chloride 0.9 % 100 mL IVPB        1 g 200 mL/hr over 30 Minutes Intravenous  Once 01/15/23 1950 01/15/23 2114   01/15/23 2000  azithromycin (ZITHROMAX) 500 mg in dextrose 5 % 250 mL IVPB        500 mg 250 mL/hr over 60 Minutes Intravenous  Once 01/15/23 1950 01/15/23 2223         Subjective: Patient sleeping but easily arousable.  Alert to self place and time.  Knows who the current president is.  Also knows who the incoming president is.  Denies any chest pain or shortness of breath.  No abdominal pain.  Asking when she is going to be able to go back to the facility.  Objective: Vitals:   01/16/23 0947 01/16/23 1206 01/16/23 1550 01/16/23 1918  BP: (!) 159/66 (!) 112/51 (!) 123/54 (!) 137/54  Pulse: 75 66 62 71  Resp: 20 20  20   Temp: 97.7 F (36.5 C) (!) 97.5 F (36.4 C)  98.7 F (37.1 C)  TempSrc:    Oral  SpO2: 98% 93% 100% 99%  Weight:      Height:        Intake/Output Summary (Last 24 hours) at 01/16/2023 1941 Last data filed at 01/16/2023 1700 Gross per 24 hour  Intake 1050.6 ml  Output 650 ml  Net 400.6 ml   Filed Weights   01/15/23 1326  Weight: 117.2 kg    Examination:  General exam: NAD. Respiratory system: Expiratory wheezing.  No crackles.  Fair air movement.  Speaking in full sentences.  Cardiovascular system: Regular rate rhythm no murmurs rubs or gallops.  No  JVD.  No pitting lower extremity edema.  Gastrointestinal system: Abdomen is soft, nontender, nondistended, positive bowel sounds.  No rebound.  No guarding. Central nervous system: Alert and oriented to self place and time.  Moving extremities spontaneously.. No focal neurological deficits. Extremities: Left lower extremity in short cast. Skin: No rashes, lesions or ulcers Psychiatry: Judgement and insight appear poor to fair. Mood & affect appropriate.     Data Reviewed: I have personally reviewed following labs and imaging studies  CBC: Recent Labs  Lab 01/09/23 2333 01/10/23 0445 01/11/23 0701 01/13/23 0319 01/15/23 1307 01/16/23 1053  WBC 9.2 8.2 8.3 7.4 9.6 11.2*  NEUTROABS 7.2  --   --   --  7.5  --   HGB 9.7* 9.1* 9.7* 8.5* 8.4* 8.7*  HCT 31.7* 30.2* 31.7* 27.2* 27.1* 28.0*  MCV 95.5 96.5 96.1 95.1 96.1 95.6  PLT 202 201 190 163 218 271    Basic Metabolic Panel: Recent Labs  Lab 01/10/23 0445 01/11/23 0701 01/12/23 0328 01/13/23 0319 01/15/23 1307 01/16/23 1053  NA 140 139 140 139 138 139  K 3.2* 3.9 4.3 3.9 4.1 3.4*  CL 93* 92* 94* 94* 97* 96*  CO2  34* 37* 36* 36* 32 32  GLUCOSE 107* 114* 122* 112* 104* 171*  BUN 34* 38* 46* 35* 17 15  CREATININE 1.20* 1.47* 1.84* 1.16* 0.99 0.72  CALCIUM 8.9 9.1 8.8* 8.7* 8.9 8.9  MG 2.1 2.0  --   --   --   --   PHOS 3.8  --   --   --   --   --     GFR: Estimated Creatinine Clearance: 67 mL/min (by C-G formula based on SCr of 0.72 mg/dL).  Liver Function Tests: Recent Labs  Lab 01/15/23 1307  AST 18  ALT 14  ALKPHOS 78  BILITOT 0.9  PROT 6.5  ALBUMIN 2.6*    CBG: Recent Labs  Lab 01/16/23 1600  GLUCAP 105*     Recent Results (from the past 240 hour(s))  Resp panel by RT-PCR (RSV, Flu A&B, Covid) Anterior Nasal Swab     Status: None   Collection Time: 01/09/23 11:20 PM   Specimen: Anterior Nasal Swab  Result Value Ref Range Status   SARS Coronavirus 2 by RT PCR NEGATIVE NEGATIVE Final    Comment:  (NOTE) SARS-CoV-2 target nucleic acids are NOT DETECTED.  The SARS-CoV-2 RNA is generally detectable in upper respiratory specimens during the acute phase of infection. The lowest concentration of SARS-CoV-2 viral copies this assay can detect is 138 copies/mL. A negative result does not preclude SARS-Cov-2 infection and should not be used as the sole basis for treatment or other patient management decisions. A negative result may occur with  improper specimen collection/handling, submission of specimen other than nasopharyngeal swab, presence of viral mutation(s) within the areas targeted by this assay, and inadequate number of viral copies(<138 copies/mL). A negative result must be combined with clinical observations, patient history, and epidemiological information. The expected result is Negative.  Fact Sheet for Patients:  BloggerCourse.com  Fact Sheet for Healthcare Providers:  SeriousBroker.it  This test is no t yet approved or cleared by the [place] FDA and  has been authorized for detection and/or diagnosis of SARS-CoV-2 by FDA under an Emergency Use Authorization (EUA). This EUA will remain  in effect (meaning this test can be used) for the duration of the COVID-19 declaration under Section 564(b)(1) of the Act, 21 U.S.C.section 360bbb-3(b)(1), unless the authorization is terminated  or revoked sooner.       Influenza A by PCR NEGATIVE NEGATIVE Final   Influenza B by PCR NEGATIVE NEGATIVE Final    Comment: (NOTE) The Xpert Xpress SARS-CoV-2/FLU/RSV plus assay is intended as an aid in the diagnosis of influenza from Nasopharyngeal swab specimens and should not be used as a sole basis for treatment. Nasal washings and aspirates are unacceptable for Xpert Xpress SARS-CoV-2/FLU/RSV testing.  Fact Sheet for Patients: BloggerCourse.com  Fact Sheet for Healthcare  Providers: SeriousBroker.it  This test is not yet approved or cleared by the [place] FDA and has been authorized for detection and/or diagnosis of SARS-CoV-2 by FDA under an Emergency Use Authorization (EUA). This EUA will remain in effect (meaning this test can be used) for the duration of the COVID-19 declaration under Section 564(b)(1) of the Act, 21 U.S.C. section 360bbb-3(b)(1), unless the authorization is terminated or revoked.     Resp Syncytial Virus by PCR NEGATIVE NEGATIVE Final    Comment: (NOTE) Fact Sheet for Patients: BloggerCourse.com  Fact Sheet for Healthcare Providers: SeriousBroker.it  This test is not yet approved or cleared by the [place] FDA and has been authorized for detection and/or  diagnosis of SARS-CoV-2 by FDA under an Emergency Use Authorization (EUA). This EUA will remain in effect (meaning this test can be used) for the duration of the COVID-19 declaration under Section 564(b)(1) of the Act, 21 U.S.C. section 360bbb-3(b)(1), unless the authorization is terminated or revoked.  Performed at [facility], [address], [place], [place] [address]   Respiratory (~20 pathogens) panel by PCR     Status: None   Collection Time: 01/16/23 12:02 PM   Specimen: Nasopharyngeal Swab; Respiratory  Result Value Ref Range Status   Adenovirus NOT DETECTED NOT DETECTED Final   Coronavirus 229E NOT DETECTED NOT DETECTED Final    Comment: (NOTE) The Coronavirus on the Respiratory Panel, DOES NOT test for the novel  Coronavirus (2019 nCoV)    Coronavirus HKU1 NOT DETECTED NOT DETECTED Final   Coronavirus NL63 NOT DETECTED NOT DETECTED Final   Coronavirus OC43 NOT DETECTED NOT DETECTED Final   Metapneumovirus NOT DETECTED NOT DETECTED Final   Rhinovirus / Enterovirus NOT DETECTED NOT DETECTED Final   Influenza A NOT DETECTED NOT DETECTED Final   Influenza B  NOT DETECTED NOT DETECTED Final   Parainfluenza Virus 1 NOT DETECTED NOT DETECTED Final   Parainfluenza Virus 2 NOT DETECTED NOT DETECTED Final   Parainfluenza Virus 3 NOT DETECTED NOT DETECTED Final   Parainfluenza Virus 4 NOT DETECTED NOT DETECTED Final   Respiratory Syncytial Virus NOT DETECTED NOT DETECTED Final   Bordetella pertussis NOT DETECTED NOT DETECTED Final   Bordetella Parapertussis NOT DETECTED NOT DETECTED Final   Chlamydophila pneumoniae NOT DETECTED NOT DETECTED Final   Mycoplasma pneumoniae NOT DETECTED NOT DETECTED Final    Comment: Performed at [facility], [address], [place], [place] [address]  SARS Coronavirus 2 by RT PCR (hospital order, performed in [facility] hospital lab) *cepheid single result test*     Status: None   Collection Time: 01/16/23 12:02 PM  Result Value Ref Range Status   SARS Coronavirus 2 by RT PCR NEGATIVE NEGATIVE Final    Comment: (NOTE) SARS-CoV-2 target nucleic acids are NOT DETECTED.  The SARS-CoV-2 RNA is generally detectable in upper and lower respiratory specimens during the acute phase of infection. The lowest concentration of SARS-CoV-2 viral copies this assay can detect is 250 copies / mL. A negative result does not preclude SARS-CoV-2 infection and should not be used as the sole basis for treatment or other patient management decisions.  A negative result may occur with improper specimen collection / handling, submission of specimen other than nasopharyngeal swab, presence of viral mutation(s) within the areas targeted by this assay, and inadequate number of viral copies (<250 copies / mL). A negative result must be combined with clinical observations, patient history, and epidemiological information.  Fact Sheet for Patients:   RoadLapTop.co.za  Fact Sheet for Healthcare Providers: http://kim-miller.com/  This test is not yet approved or  cleared by the [place]  FDA and has been authorized for detection and/or diagnosis of SARS-CoV-2 by FDA under an Emergency Use Authorization (EUA).  This EUA will remain in effect (meaning this test can be used) for the duration of the COVID-19 declaration under Section 564(b)(1) of the Act, 21 U.S.C. section 360bbb-3(b)(1), unless the authorization is terminated or revoked sooner.  Performed at [facility], [address] [place], [place] [address]          Radiology Studies: VAS [name] LOWER EXTREMITY VENOUS (DVT) (7a-7p)  Result Date: 01/15/2023  Lower Venous  DVT Study Patient Name:  REMMIE BEMBENEK  Date of Exam:   01/15/2023 Medical Rec #: [id number]   Accession #:    [id number] Date of Birth: 06-24-1935    Patient Gender: F Patient Age:   26 years Exam Location:  [facility] Procedure:      VAS [name] LOWER EXTREMITY VENOUS (DVT) Referring Phys: Molly Maduro LOCKWOOD --------------------------------------------------------------------------------  Indications: Edema.  Risk Factors: Surgery Trauma. Limitations: Body habitus, poor ultrasound/tissue interface and patient positioning, patient immobility, patient pain tolerance, hard cast. Comparison Study: No prior studies. Performing Technologist: Chanda Busing RVT  Examination Guidelines: A complete evaluation includes B-mode imaging, spectral Doppler, color Doppler, and power Doppler as needed of all accessible portions of each vessel. Bilateral testing is considered an integral part of a complete examination. Limited examinations for reoccurring indications may be performed as noted. The reflux portion of the exam is performed with the patient in reverse Trendelenburg.  +-----+---------------+---------+-----------+----------+--------------+ RIGHTCompressibilityPhasicitySpontaneityPropertiesThrombus Aging +-----+---------------+---------+-----------+----------+--------------+ CFV  Full           Yes      Yes                                  +-----+---------------+---------+-----------+----------+--------------+   +---------+---------------+---------+-----------+----------+-------------------+ LEFT     CompressibilityPhasicitySpontaneityPropertiesThrombus Aging      +---------+---------------+---------+-----------+----------+-------------------+ CFV      Full           Yes      Yes                                      +---------+---------------+---------+-----------+----------+-------------------+ SFJ      Full                                                             +---------+---------------+---------+-----------+----------+-------------------+ FV Prox  Full                                                             +---------+---------------+---------+-----------+----------+-------------------+ FV Mid                  Yes      Yes                                      +---------+---------------+---------+-----------+----------+-------------------+ FV Distal               Yes      Yes                                      +---------+---------------+---------+-----------+----------+-------------------+ POP                     Yes      Yes                                      +---------+---------------+---------+-----------+----------+-------------------+  PTV      Full                                                             +---------+---------------+---------+-----------+----------+-------------------+ PERO                                                  Not well visualized +---------+---------------+---------+-----------+----------+-------------------+     Summary: RIGHT: - No evidence of common femoral vein obstruction.   LEFT: - There is no evidence of deep vein thrombosis in the lower extremity. However, portions of this examination were limited- see technologist comments above.  - No cystic structure found in the popliteal fossa.  *See table(s) above for measurements and  observations. Electronically signed by Coral Else MD on 01/15/2023 at 9:29:03 PM.    Final    CT Head Wo Contrast  Result Date: 01/15/2023 CLINICAL DATA:  Altered mental status. EXAM: CT HEAD WITHOUT CONTRAST TECHNIQUE: Contiguous axial images were obtained from the base of the skull through the vertex without intravenous contrast. RADIATION DOSE REDUCTION: This exam was performed according to the departmental dose-optimization program which includes automated exposure control, adjustment of the mA and/or kV according to patient size and/or use of iterative reconstruction technique. COMPARISON:  Head CT dated 08/26/2021. FINDINGS: Brain: Mild age-related atrophy and chronic microvascular ischemic changes. There is no acute intracranial hemorrhage. No mass effect or midline shift. No extra-axial fluid collection. Vascular: No hyperdense vessel or unexpected calcification. Skull: Normal. Negative for fracture or focal lesion. Sinuses/Orbits: No acute finding. Other: None IMPRESSION: 1. No acute intracranial pathology. 2. Mild age-related atrophy and chronic microvascular ischemic changes. Electronically Signed   By: Elgie Collard M.D.   On: 01/15/2023 17:59   CT Angio Chest PE W/Cm &/Or Wo Cm  Result Date: 01/15/2023 CLINICAL DATA:  Mental status change EXAM: CT ANGIOGRAPHY CHEST WITH CONTRAST TECHNIQUE: Multidetector CT imaging of the chest was performed using the standard protocol during bolus administration of intravenous contrast. Multiplanar CT image reconstructions and MIPs were obtained to evaluate the vascular anatomy. RADIATION DOSE REDUCTION: This exam was performed according to the departmental dose-optimization program which includes automated exposure control, adjustment of the mA and/or kV according to patient size and/or use of iterative reconstruction technique. CONTRAST:  75mL OMNIPAQUE IOHEXOL 350 MG/ML SOLN COMPARISON:  None Available. FINDINGS: Cardiovascular: No evidence of central  pulmonary embolus. Limits evaluation of the segmental and subsegmental pulmonary arteries due to bolus timing and motion artifact. Mild cardiomegaly. Normal caliber thoracic aorta with moderate atherosclerotic disease. Mild coronary artery calcifications. Mediastinum/Nodes: Esophagus thyroid are unremarkable. Mildly enlarged bilateral hilar lymph nodes which are likely reactive. Reference right hilar lymph node measuring 13 mm in short axis on series 6, image 52. Lungs/Pleura: Central airways are patent. Moderate bilateral bronchial wall thickening. Clustered solid nodules of the right upper lobe largest measures 13 x 8 mm on series 14, image 58. Additional solid nodule of the right middle lobe measuring 7 mm on series 14, image 84. Left-greater-than-right atelectasis of the lower lobes. No pleural effusion or pneumothorax. 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 Musculoskeletal:  No chest wall abnormality. No acute or significant osseous findings. Review of the MIP images confirms the above findings. IMPRESSION: 1. No evidence of central pulmonary embolus. Limits evaluation of the segmental and subsegmental pulmonary arteries due to bolus timing and motion artifact. 2. Moderate bilateral bronchial wall thickening, findings can be seen in the setting of bronchitis. 3. Solid pulmonary nodules of the right upper lobe and right middle lobe, possibly infectious, largest measures up to 13 mm. Follow-up chest CT is recommended in 3 months to ensure resolution. 4. Mildly enlarged bilateral hilar lymph nodes which are likely reactive. 5. 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 6. Aortic Atherosclerosis (ICD10-I70.0). Electronically Signed   By: Allegra Lai M.D.   On: 01/15/2023 16:35   DG Chest Port 1 View  Result Date: 01/15/2023 CLINICAL DATA:  Fatigue. EXAM: PORTABLE CHEST 1 VIEW COMPARISON:  January 09, 2023. FINDINGS: Stable cardiomegaly.  Minimal bibasilar subsegmental atelectasis is noted. The visualized skeletal structures are unremarkable. IMPRESSION: Minimal bibasilar subsegmental atelectasis. Electronically Signed   By: Lupita Raider M.D.   On: 01/15/2023 16:26        Scheduled Meds:  azithromycin  500 mg Oral Daily   cyanocobalamin  500 mcg Oral Daily   enalapril  20 mg Oral BID   enoxaparin (LOVENOX) injection  55 mg Subcutaneous Q24H   fluticasone furoate-vilanterol  1 puff Inhalation Daily   ipratropium-albuterol  3 mL Nebulization TID   levothyroxine  175 mcg Oral Q0600   pantoprazole  40 mg Oral BID AC   senna  1 tablet Oral BID   Continuous Infusions:  sodium chloride 75 mL/hr at 01/16/23 1159   cefTRIAXone (ROCEPHIN)  IV       LOS: 0 days    Time spent: 40 minutes    Ramiro Harvest, MD [facility]   To contact the attending provider between 7A-7P or the covering provider during after hours 7P-7A, please log into the web site [email or web address] and access using universal [place] password for that web site. If you do not have the password, please call the hospital operator.  01/16/2023, 7:41 PM

## 2023-01-16 NOTE — Progress Notes (Signed)
Informed by phlebotomist that patient is refusing am lab draws.

## 2023-01-16 NOTE — Progress Notes (Signed)
   01/16/23 2353  BiPAP/CPAP/SIPAP  Reason BIPAP/CPAP not in use Other(comment) (refused)

## 2023-01-16 NOTE — Progress Notes (Signed)
   01/16/23 0100  BiPAP/CPAP/SIPAP  Reason BIPAP/CPAP not in use Other(comment) (refuses)

## 2023-01-16 NOTE — Evaluation (Signed)
Clinical/Bedside Swallow Evaluation Patient Details  Name: Valerie Bowman MRN: [id number] Date of Birth: Jun 08, 1935  Today's Date: 01/16/2023 Time: SLP Start Time (ACUTE ONLY): 1402 SLP Stop Time (ACUTE ONLY): 1425 SLP Time Calculation (min) (ACUTE ONLY): 23 min  Past Medical History:  Past Medical History:  Diagnosis Date   Acquired hypothyroidism    Chronic diastolic heart failure (HCC)    COPD (chronic obstructive pulmonary disease) (HCC)    Essential hypertension    Hyperlipidemia    Urinary incontinence    Past Surgical History: History reviewed. No pertinent surgical history. HPI:  Valerie Bowman is a 87 y.o. female with medical history significant for COPD, chronic respiratory failure with hypoxia and hypercapnia on 2 L O2 via [place] at baseline, chronic HFpEF, CKD stage IIIa, HTN, HLD, anemia, OSA on trilogy vent nightly who presented to the ED from SNF for evaluation of altered mental status.  History is supplemented by patient's daughter at bedside.10/31-11/07/2022 was here.   PT/OT recommended SNF and patient was discharged to SNF yesterday. Patient uses trilogy vent at night at baseline. She was not tolerating throughout the night and kept taking it off.She had a similar episode in September when she had the COVID-19 viral infection which eventually resolved on its own.Patient reports a chronic cough productive of white sputum which has not changed from baseline.  She denies chest pain, dyspnea, abdominal pain, dysuria, diarrhea.    Assessment / Plan / Recommendation  Clinical Impression  Patient greeted sitting upright in bed with RN attempting to administer medications.  Pt needed encouragement to allow SLP and her RN to slide her up in bed for optimal positioning. After proper positioning, pt consumed single pill with water, apple juice, applesauce and graham cracker boluses as well as pill with water given by RN.  She did demonstrate cough post-swallow of liquids -that was not observed with  minimal solid/puree she accepted.  Slightly thicker liquids were not tolerated better than thin and cough on prior MBS was not due to aspiration. She does demonstrate right anterior spillage of liquids when consuming via cup/straw and asking why it's occuring, however upon oral motor exam, labial closure was intact.   She only consumed small amount of po - stating she wanted to rest.      Reviewed prior MBS studies with pt from 2023 - fall which revealed a functional oropharyngeal swallow ability and intermittent flash penetration of thin liquids which is [facility] for her age.  She also has Zenker's and was noted to have barium retained in her esophagus upon esophageal sweep at completion of her MBS. Pt admits to using straws as it is easier to her to drink from a straw when lying down. Advised she consume all po upright and stay upright for at least 30 minutes after meals.  Her pna risk is increased at this time due to her lack of mobility from her ankle injury - thus informed her of importance of using caution with intake and using her flutter valve.  Pt agreeable at this time to take pills with applesauce.  Behavior modifications would help to maximize pt's airway protection - and education completed.  Thanks for this consult. SLP Visit Diagnosis: Dysphagia, unspecified (R13.10)    Aspiration Risk  Mild aspiration risk;Moderate aspiration risk (mild with general precautions)    Diet Recommendation Regular;Thin liquid    Liquid Administration via: Cup;Straw Medication Administration: Whole meds with puree Supervision: Patient able to self feed Compensations: Slow rate;Small sips/bites Postural Changes: Seated upright  at 90 degrees;Remain upright for at least 30 minutes after po intake    Other  Recommendations Oral Care Recommendations: Oral care BID    Recommendations for follow up therapy are one component of a multi-disciplinary discharge planning process, led by the attending physician.   Recommendations may be updated based on patient status, additional functional criteria and insurance authorization.  Follow up Recommendations No SLP follow up      Assistance Recommended at Discharge  N/a  Functional Status Assessment Patient has not had a recent decline in their functional status  Frequency and Duration     N/a       Prognosis    N/a    Swallow Study   General Date of Onset: 01/16/23 HPI: Valerie Bowman is a 87 y.o. female with medical history significant for COPD, chronic respiratory failure with hypoxia and hypercapnia on 2 L O2 via [place] at baseline, chronic HFpEF, CKD stage IIIa, HTN, HLD, anemia, OSA on trilogy vent nightly who presented to the ED from SNF for evaluation of altered mental status.  History is supplemented by patient's daughter at bedside.10/31-11/07/2022 was here.   PT/OT recommended SNF and patient was discharged to SNF yesterday. Patient uses trilogy vent at night at baseline. She was not tolerating throughout the night and kept taking it off.She had a similar episode in September when she had the COVID-19 viral infection which eventually resolved on its own.Patient reports a chronic cough productive of white sputum which has not changed from baseline.  She denies chest pain, dyspnea, abdominal pain, dysuria, diarrhea. Type of Study: Bedside Swallow Evaluation Diet Prior to this Study: Regular;Thin liquids (Level 0) Temperature Spikes Noted: No Respiratory Status: Nasal cannula History of Recent Intubation: No Behavior/Cognition: Alert;Cooperative;Distractible;Other (Comment) (sleepy) Oral Cavity Assessment: Within Functional Limits Oral Care Completed by SLP: No Oral Cavity - Dentition: Adequate natural dentition Vision: Functional for self-feeding Self-Feeding Abilities: Able to feed self Patient Positioning: Upright in bed Baseline Vocal Quality: Hoarse Volitional Cough: Strong Volitional Swallow: Able to elicit    Oral/Motor/Sensory Function  Overall Oral Motor/Sensory Function: Within functional limits   Ice Chips Ice chips: Not tested   Thin Liquid Thin Liquid: Impaired Presentation: Cup;Self Fed;Straw Oral Phase Impairments: Reduced labial seal Oral Phase Functional Implications: Right anterior spillage (pt also slightly leaning to the right) Pharyngeal  Phase Impairments: Cough - Delayed Other Comments: cough post-swallow    Nectar Thick Presentation: Cup;Self Fed;Straw Pharyngeal Phase Impairments: Cough - Delayed   Honey Thick Honey Thick Liquid: Not tested   Puree Puree: Within functional limits Presentation: Self Fed;Spoon   Solid     Solid: Within functional limits Presentation: Self Fed      Chales Abrahams 01/16/2023,2:58 PM  Rolena Infante, MS [facility] SLP Acute Rehab Services Office [phone]

## 2023-01-16 NOTE — TOC Initial Note (Signed)
Transition of Care [facility]) - Initial/Assessment Note    Patient Details  Name: Valerie Bowman MRN: [id number] Date of Birth: Sep 10, 1935  Transition of Care [facility]) CM/SW Contact:    Otelia Santee, LCSW Phone Number: 01/16/2023, 2:08 PM  Clinical Narrative:                 Pt resides at [facility] and was recently discharged from the hospital on 11/5 to [facility] for ST SNF. Confirmed with family and [facility] that pt is to return to their facility at discharge.  TOC will follow for discharge readiness.   Expected Discharge Plan: Skilled Nursing Facility Barriers to Discharge: No Barriers Identified   Patient Goals and CMS Choice Patient states their goals for this hospitalization and ongoing recovery are:: For pt to return to [facility].gov Compare Post Acute Care list provided to::  (NA) Choice offered to / list presented to :  (NA) [place] ownership interest in [facility].provided to::  (NA)    Expected Discharge Plan and Services In-house Referral: Clinical Social Work Discharge Planning Services: NA Post Acute Care Choice: Skilled Nursing Facility Living arrangements for the past 2 months: Assisted Living Facility                 DME Arranged: N/A DME Agency: NA                  Prior Living Arrangements/Services Living arrangements for the past 2 months: Assisted Living Facility Lives with:: Facility Resident Patient language and need for interpreter reviewed:: Yes Do you feel safe going back to the place where you live?: Yes      Need for Family Participation in Patient Care: Yes (Comment) Care giver support system in place?: Yes (comment) Current home services: DME Criminal Activity/Legal Involvement Pertinent to Current Situation/Hospitalization: No - Comment as needed  Activities of Daily Living      Permission Sought/Granted Permission sought to share information with : Facility Medical sales representative, Family  Supports Permission granted to share information with : Yes, Verbal Permission Granted  Share Information with NAME: Buel Ream  Permission granted to share info w AGENCY: [employer]  Permission granted to share info w Relationship: Daughter  Permission granted to share info w Contact Information: [phone]  Emotional Assessment       Orientation: : Oriented to Self, Oriented to Place, Oriented to  Time Alcohol / Substance Use: Not Applicable Psych Involvement: No (comment)  Admission diagnosis:  Delirium [R41.0] Altered mental status [R41.82] Acute cough [R05.1] Acute metabolic encephalopathy [G93.41] AMS (altered mental status) [R41.82] Patient Active Problem List   Diagnosis Date Noted   Acute metabolic encephalopathy 01/16/2023   AMS (altered mental status) 01/16/2023   Altered mental status 01/15/2023   Chronic kidney disease, stage 3a (HCC) 01/15/2023   Closed left ankle fracture 01/10/2023   Chronic cough 01/15/2022   OSA (obstructive sleep apnea) 12/19/2021   Chronic respiratory failure with hypoxia and hypercapnia (HCC) 08/23/2021   Pain in right shoulder 03/20/2021   SOB (shortness of breath) 02/28/2021   Acquired hypothyroidism    Essential hypertension    Chronic diastolic heart failure (HCC)    COPD, severe (HCC) 02/27/2021   Low back pain 02/20/2021   PCP:  Smiley Houseman, NP Pharmacy:  No Pharmacies Listed    Social Determinants of Health (SDOH) Social History: SDOH Screenings   Food Insecurity: No Food Insecurity (01/16/2023)  Housing: Low Risk  (01/16/2023)  Transportation Needs: No Transportation  Needs (01/16/2023)  Utilities: Not At Risk (01/16/2023)  Financial Resource Strain: Low Risk  (09/10/2020)   Received from [facility]  Physical Activity: Inactive (09/10/2020)   Received from [facility]  Social Connections: Moderately Isolated (09/10/2020)   Received from [facility]  Stress: No Stress Concern Present (09/10/2020)   Received from [facility]  Tobacco Use: Medium Risk (01/15/2023)   SDOH Interventions:     Readmission Risk Interventions     No data to display

## 2023-01-16 NOTE — Care Management Obs Status (Signed)
MEDICARE OBSERVATION STATUS NOTIFICATION   Patient Details  Name: Valerie Bowman MRN: [id number] Date of Birth: [birth year]   Medicare Observation Status Notification Given:  Yes    Otelia Santee, LCSW 01/16/2023, 1:18 PM

## 2023-01-17 ENCOUNTER — Inpatient Hospital Stay (HOSPITAL_COMMUNITY): Payer: Medicare Other

## 2023-01-17 DIAGNOSIS — J9611 Chronic respiratory failure with hypoxia: Secondary | ICD-10-CM | POA: Diagnosis not present

## 2023-01-17 DIAGNOSIS — R4182 Altered mental status, unspecified: Secondary | ICD-10-CM | POA: Diagnosis not present

## 2023-01-17 DIAGNOSIS — I10 Essential (primary) hypertension: Secondary | ICD-10-CM | POA: Diagnosis not present

## 2023-01-17 DIAGNOSIS — N1831 Chronic kidney disease, stage 3a: Secondary | ICD-10-CM | POA: Diagnosis not present

## 2023-01-17 LAB — CBC WITH DIFFERENTIAL/PLATELET
Abs Immature Granulocytes: 0.03 10*3/uL (ref 0.00–0.07)
Basophils Absolute: 0 10*3/uL (ref 0.0–0.1)
Basophils Relative: 0 %
Eosinophils Absolute: 0.1 10*3/uL (ref 0.0–0.5)
Eosinophils Relative: 2 %
HCT: 25.8 % — ABNORMAL LOW (ref 36.0–46.0)
Hemoglobin: 7.9 g/dL — ABNORMAL LOW (ref 12.0–15.0)
Immature Granulocytes: 0 %
Lymphocytes Relative: 7 %
Lymphs Abs: 0.6 10*3/uL — ABNORMAL LOW (ref 0.7–4.0)
MCH: 29.7 pg (ref 26.0–34.0)
MCHC: 30.6 g/dL (ref 30.0–36.0)
MCV: 97 fL (ref 80.0–100.0)
Monocytes Absolute: 1 10*3/uL (ref 0.1–1.0)
Monocytes Relative: 12 %
Neutro Abs: 6.3 10*3/uL (ref 1.7–7.7)
Neutrophils Relative %: 79 %
Platelets: 230 10*3/uL (ref 150–400)
RBC: 2.66 MIL/uL — ABNORMAL LOW (ref 3.87–5.11)
RDW: 15.6 % — ABNORMAL HIGH (ref 11.5–15.5)
WBC: 8 10*3/uL (ref 4.0–10.5)
nRBC: 0.2 % (ref 0.0–0.2)

## 2023-01-17 LAB — BASIC METABOLIC PANEL
Anion gap: 8 (ref 5–15)
BUN: 13 mg/dL (ref 8–23)
CO2: 33 mmol/L — ABNORMAL HIGH (ref 22–32)
Calcium: 8.5 mg/dL — ABNORMAL LOW (ref 8.9–10.3)
Chloride: 99 mmol/L (ref 98–111)
Creatinine, Ser: 0.77 mg/dL (ref 0.44–1.00)
GFR, Estimated: >60 mL/min (ref >60)
Glucose, Bld: 118 mg/dL — ABNORMAL HIGH (ref 70–99)
Potassium: 3.9 mmol/L (ref 3.5–5.1)
Sodium: 140 mmol/L (ref 135–145)

## 2023-01-17 LAB — LEGIONELLA PNEUMOPHILA SEROGP 1 UR AG: L. pneumophila Serogp 1 Ur Ag: NEGATIVE

## 2023-01-17 LAB — MAGNESIUM: Magnesium: 2.1 mg/dL (ref 1.7–2.4)

## 2023-01-17 MED ORDER — IPRATROPIUM-ALBUTEROL 0.5-2.5 (3) MG/3ML IN SOLN
3.0000 mL | Freq: Two times a day (BID) | RESPIRATORY_TRACT | Status: DC
  Administered 2023-01-17 – 2023-01-21 (×8): 3 mL via RESPIRATORY_TRACT
  Filled 2023-01-17 (×8): qty 3

## 2023-01-17 NOTE — Progress Notes (Addendum)
MBS completed full report to follow.  Preliminary results of study reveals patient with audible aspiration of thin liquid and trace silent aspiration of thin and large bolus of nectar.  This represents a diminished ability and swallowing compared to prior MBS.  Impaired timing of swallow, inadequacy of laryngeal vestibule closure and elevation is the source of her dysphagia.  Postures including chin tuck and separately head of the bed reclined to approximately 45 degrees prevents patient from aspirating liquids.  Patient was hesitant to conduct either strategy stating she looks like a woodpecker and/or she feels uncomfortable swallowing in this position.  Small single sips of nectar thick liquid was tolerated without aspiration.  Though aspiration was not gross consistent aspiration over time likely can contribute to potential pulmonary infections.  As patient is currently in the hospital recommend nectar thick liquids during meals and implement [employer] protocol.  Medicine with pure whole advised. Patient will benefit from follow-up SLP to address dysphagia.  Using teach back with recorded video patient educated to recommendations.  Of note cough was correlated to aspiration during today's evaluation-again different than prior findings.  Rolena Infante, MS [facility] SLP Acute [employer] [phone]

## 2023-01-17 NOTE — Progress Notes (Signed)
   01/17/23 1956  BiPAP/CPAP/SIPAP  BiPAP/CPAP/SIPAP Pt Type Adult  Reason BIPAP/CPAP not in use Non-compliant (Doe not have home Trilogy machine with her and refuses hospital supplied machine at this time.)

## 2023-01-17 NOTE — Progress Notes (Signed)
SLP Note  Patient Details Name: Valerie Bowman MRN: [id number] DOB: 08/03/1935   Cancelled treatment:       Reason Eval/Treat Not Completed: Other (comment) (RN made SlP aware of pt overtly coughing with po and concern for aspiration - spoke to MD re: prior coughing on MBS not coorelated to aspiration - but repeat MBS would elucidate source, MD agrees to proceed with MBS, ordered.)   RN, Sam, informed pt of plan and pt reportedly agreeable.   Chales Abrahams 01/17/2023, 11:58 AM

## 2023-01-17 NOTE — Procedures (Signed)
Modified Barium Swallow Study  Patient Details  Name: Valerie Bowman MRN: [id number] Date of Birth: [birth year]  Today's Date: 01/17/2023  Modified Barium Swallow completed.  Full report located under Chart Review in the Imaging Section.  History of Present Illness Valerie Bowman is a 87 y.o. female with medical history significant for COPD, chronic respiratory failure with hypoxia and hypercapnia on 2 L O2 via [place] at baseline, chronic HFpEF, CKD stage IIIa, HTN, HLD, anemia, OSA on trilogy vent nightly who presented to the ED from SNF for evaluation of altered mental status. .10/31-11/07/2022 was here.   PT/OT recommended SNF and patient was discharged to SNF.  Patient uses trilogy vent at night at baseline. She was not tolerating throughout the night and kept taking it off.She had a similar episode in September when she had the COVID-19 viral infection which eventually resolved on its own.Patient reports a chronic cough productive of white sputum which has not changed from baseline.  She denies chest pain, dyspnea, abdominal pain, dysuria, diarrhea.  Pt has undergone BSE yesterday - and RN reports pt with overtly coughing and ongoing concern for aspiration.  Thus SLP spoke to MD and he agreed to order MBS.  Pt had prior MBS and was coughing without asp observed.  Transient penetration previously noted.   Clinical Impression Patient presents with mild oropharynbeal dysphagia -  This represents a diminished ability and swallowing compared to prior MBS. Her oral dysphagia dysphagia is due to impaired coordination/control- allowing spillage of liquids into pharynx-- trachea poorly controlled.  Audible aspiration of thin liquid and trace silent aspiration of thin and large bolus of nectar occured.  Pharyngeal swallow dysfunction chacterized by impaired timing of swallow, inadequacy of laryngeal vestibule closure and elevation.  Postures including chin tuck and separately head of the bed reclined to approximately 45  degrees prevents patient from aspirating liquids.  Patient was hesitant to conduct either strategy stating she looks like a woodpecker and/or she feels uncomfortable swallowing in this position.  Small single sips of nectar thick liquid was tolerated without aspiration.  Though aspiration was not gross consistent aspiration over time likely can contribute to potential pulmonary infections.  As patient is currently in the hospital recommend nectar thick liquids during meals and implement [employer] protocol.  Medicine with pure whole advised. Patient will benefit from follow-up SLP to address dysphagia and train compensation strategies for maximal airway protection.  Using teach back with recorded video patient educated to recommendations.  Of note cough was correlated to aspiration during today's evaluation-again different than prior findings. Factors that may increase risk of adverse event in presence of aspiration Valerie Bowman & Clearance Valerie Bowman 2021): Frail or deconditioned;Respiratory or GI disease  Swallow Evaluation Recommendations Recommendations: PO diet PO Diet Recommendation: Regular;Mildly thick liquids (Level 2, nectar thick) Liquid Administration via: Cup;Straw Medication Administration: Whole meds with puree Supervision: Intermittent supervision/cueing for swallowing strategies Swallowing strategies  : Small bites/sips;Slow rate Postural changes: Stay upright 30-60 min after meals Oral care recommendations: Oral care BID (2x/day)  Rolena Infante, MS [facility] SLP Acute Rehab Services Office [phone]     Chales Abrahams 01/17/2023,3:52 PM

## 2023-01-17 NOTE — Plan of Care (Signed)
  Problem: Activity: Goal: Ability to tolerate increased activity will improve Outcome: Progressing   Problem: Clinical Measurements: Goal: Ability to maintain a body temperature in the normal range will improve Outcome: Progressing   Problem: Activity: Goal: Risk for activity intolerance will decrease Outcome: Progressing   Problem: Pain Management: Goal: General experience of comfort will improve Outcome: Progressing

## 2023-01-17 NOTE — Evaluation (Signed)
Physical Therapy Evaluation Patient Details Name: Earlean Glasford MRN: [id number] DOB: [birth year] Today's Date: 01/17/2023  History of Present Illness  87 yo female presents to therapy following hospital admission on 01/15/2023 due to AMS and lethargy with pt transitioning from SNF for short term rehab following hospitalization   10/31-11/5 secondary to mechanical fall at [facility]. Pt found to have  RUL and RML pulmonary nodules on CTA and possible infectious process.Fall resulted in L ankle trimalleolar fx, orthopedics recommends conservative measures at this time due to extensive edema. Pt is L LE NWB. Pt PMH includes but is not limited to: falls, hypothyroidism, COPD, HTN, chronic HFpEF and GERD.  Clinical Impression      Pt admitted with above diagnosis.  Pt currently with functional limitations due to the deficits listed below (see PT Problem List). Pt in bed when PT arrived, nurse present. Pt exhibits some confusion and has no recall of hospital d/c and transition to [facility] on 11/5. Pt required max A x 2 for supine to sit, mod to min A for sitting balance EOB  for 5 mins with multimodal cues with increased time for processing. Pt required max A for sit to supine and total A for repositioning in bed. Pt on 2 L/min and maintaining O2 saturation >/=90% and nurse made aware. Pt left in bed all needs in place. Pt denies L LE pain.  PT anticipates pt will return to [facility] for continued skilled therapy intervention <3 hours/day at time of d/c.  Pt will benefit from acute skilled PT to increase their independence and safety with mobility to allow discharge.        If plan is discharge home, recommend the following: Two people to help with walking and/or transfers;Two people to help with bathing/dressing/bathroom;Assistance with cooking/housework;Assistance with feeding;Assist for transportation;Direct supervision/assist for financial management;Direct supervision/assist for medications  management;Help with stairs or ramp for entrance;Supervision due to cognitive status   Can travel by private vehicle        Equipment Recommendations None recommended by PT  Recommendations for Other Services       Functional Status Assessment Patient has had a recent decline in their functional status and demonstrates the ability to make significant improvements in function in a reasonable and predictable amount of time.     Precautions / Restrictions Precautions Precautions: Fall Required Braces or Orthoses: Splint/Cast Splint/Cast - Date Prophylactic Dressing Applied (if applicable): 01/13/23 Restrictions Weight Bearing Restrictions: Yes LLE Weight Bearing: Non weight bearing      Mobility  Bed Mobility Overal bed mobility: Needs Assistance Bed Mobility: Rolling, Sit to Supine, Supine to Sit Rolling: Max assist, Used rails   Supine to sit: Max assist, +2 for physical assistance, +2 for safety/equipment, HOB elevated Sit to supine: +2 for physical assistance, Max assist, +2 for safety/equipment   General bed mobility comments: pt required cues and increased time for all functional mobiltiy with extensive assist, pt able to initiate B LE movement toward EOB. pt required total A for repositioning in bed.    Transfers                   General transfer comment: NT for pt and staff safety    Ambulation/Gait               General Gait Details: NT  Stairs            Wheelchair Mobility     Tilt Bed    Modified Rankin (Stroke Patients Only)  Balance Overall balance assessment: Needs assistance, History of Falls Sitting-balance support: Feet supported, Bilateral upper extremity supported, Single extremity supported Sitting balance-Leahy Scale: Poor Sitting balance - Comments: pt required min to mod A to maintain sitting balance EOB with R LE support and B UE support with slight R lateral lean and trunk flexion. pt required cues for  encouragement and able to maintain sitting EOB for 5 mins on 2 L/min and O2 saturation 90-92% with intermittent cues for pursed lip breathin g                                     Pertinent Vitals/Pain Pain Assessment Pain Assessment: No/denies pain    Home Living Family/patient expects to be discharged to:: Assisted living                 Home Equipment: Agricultural consultant (2 wheels);Wheelchair - manual;Lift chair Additional Comments: [facility]. pt required A for all functional mobility tasks, primarily used wc, however dependent for mobility and slept in a lift chair.  Pt's wheelchair is without LE supports.    Prior Function Prior Level of Function : Needs assist;History of Falls (last six months)       Physical Assist : ADLs (physical);Mobility (physical) Mobility (physical): Transfers;Bed mobility ADLs (physical): Grooming;Dressing;Bathing;IADLs;Toileting Mobility Comments: pt required A for all functional mobility tasks primarily at wc level ADLs Comments: staff assist wtih medication management, ADLs and meals. Pt voids into a brief and staff performs hygiene at bed level.     Extremity/Trunk Assessment        Lower Extremity Assessment Lower Extremity Assessment: RLE deficits/detail RLE Deficits / Details: recent ankle fx and in hard cast at this time, pt exhibits global weakness B LE, core and UEs with occational involuntary jerking rigor like movements B UE and LE reporting it is due to peripherial neuropathy RLE Sensation: history of peripheral neuropathy LLE Sensation: history of peripheral neuropathy    Cervical / Trunk Assessment Cervical / Trunk Assessment: Kyphotic;Other exceptions Cervical / Trunk Exceptions: Body habitus and LLE edema  Communication   Communication Communication: Difficulty following commands/understanding;Hearing impairment Following commands: Follows one step commands with increased time Cueing Techniques:  Verbal cues;Gestural cues;Tactile cues;Visual cues  Cognition Arousal: Alert Behavior During Therapy: WFL for tasks assessed/performed Overall Cognitive Status: Impaired/Different from baseline Area of Impairment: Attention, Awareness, Safety/judgement, Following commands, Memory, Orientation                 Orientation Level: Person Current Attention Level: Selective Memory: Decreased short-term memory, Decreased recall of precautions Following Commands: Follows one step commands consistently Safety/Judgement: Decreased awareness of deficits Awareness: Anticipatory   General Comments: pt is oriented to self, able to communicate location with increased time and context clues, reports Novemeber and recent election and outcome, pt however did not recall hospital d/c 11/5 and transition to SNF.        General Comments General comments (skin integrity, edema, etc.): 3 L/min when PT arrived 98% at rest, pt requires 2 L/min supplemental O2 at baseline and PT placed pt on 2 L/min and at rest 99% and assessed throughout intervention on 2 L/min and nurse made aware pt left on 2 L/min and 94% at rest s/p bed mobiltiy    Exercises     Assessment/Plan    PT Assessment Patient needs continued PT services  PT Problem List Decreased strength;Decreased range of motion;Decreased activity tolerance;Decreased  balance;Decreased mobility;Decreased coordination;Decreased cognition;Decreased safety awareness;Decreased knowledge of precautions;Cardiopulmonary status limiting activity;Pain       PT Treatment Interventions DME instruction;Functional mobility training;Therapeutic activities;Therapeutic exercise;Balance training;Neuromuscular re-education;Cognitive remediation;Patient/family education;Modalities    PT Goals (Current goals can be found in the Care Plan section)  Acute Rehab PT Goals PT Goal Formulation: Patient unable to participate in goal setting Time For Goal Achievement:  02/07/23 Potential to Achieve Goals: Fair    Frequency Min 1X/week     Co-evaluation               AM-PAC PT "6 Clicks" Mobility  Outcome Measure Help needed turning from your back to your side while in a flat bed without using bedrails?: A Lot Help needed moving from lying on your back to sitting on the side of a flat bed without using bedrails?: Total Help needed moving to and from a bed to a chair (including a wheelchair)?: Total Help needed standing up from a chair using your arms (e.g., wheelchair or bedside chair)?: Total Help needed to walk in hospital room?: Total Help needed climbing 3-5 steps with a railing? : Total 6 Click Score: 7    End of Session Equipment Utilized During Treatment: Oxygen Activity Tolerance: Patient limited by fatigue Patient left: in bed;with call bell/phone within reach Nurse Communication: Mobility status;Need for lift equipment;Weight bearing status PT Visit Diagnosis: Unsteadiness on feet (R26.81);Other abnormalities of gait and mobility (R26.89);Muscle weakness (generalized) (M62.81);History of falling (Z91.81);Difficulty in walking, not elsewhere classified (R26.2);Pain Pain - Right/Left: Left Pain - part of body: Leg;Ankle and joints of foot    Time: 1451-1515 PT Time Calculation (min) (ACUTE ONLY): 24 min   Charges:   PT Evaluation $PT Eval Low Complexity: 1 Low PT Treatments $Therapeutic Activity: 8-22 mins PT General Charges $$ ACUTE PT VISIT: 1 Visit         Johnny Bridge, PT Acute Rehab   Jacqualyn Posey 01/17/2023, 3:55 PM

## 2023-01-17 NOTE — Progress Notes (Signed)
PROGRESS NOTE    Valerie Bowman  GMW:[id number] DOB: December 30, 1935 DOA: 01/15/2023 PCP: Smiley Houseman, NP    Chief Complaint  Patient presents with   Altered Mental Status    Brief Narrative:  Patient is a 87 year old female history of COPD, chronic respiratory failure with hypoxia and hypercapnia on 2 L nasal cannula at baseline, chronic HFpEF, CKD stage IIIa, hypertension, hyperlipidemia, anemia, OSA on trilogy vent nightly presented to the ED with altered mental status after recent discharge hospitalization 01/09/2023-01/14/2023 for close left ankle fracture after mechanical fall.   Assessment & Plan:   Principal Problem:   Altered mental status Active Problems:   Chronic respiratory failure with hypoxia and hypercapnia (HCC)   COPD, severe (HCC)   Acquired hypothyroidism   Essential hypertension   OSA (obstructive sleep apnea)   Closed left ankle fracture   Chronic kidney disease, stage 3a (HCC)   Acute metabolic encephalopathy   AMS (altered mental status)   Abnormal chest x-ray with multiple lung nodules  #1 altered mental status -Likely multifactorial secondary to potential pulmonary infection, narcotic pain medications, possible nonadherence to trilogy vent overnight. -Patient noted to have reported some hallucinations which has since improved.. -Patient more alert today oriented to self place and time.  Patient knows who the president is currently and who the incoming president is going to be. -Urinalysis nitrite negative, leukocytes negative. -Urine strep pneumococcus antigen is negative. -CT head negative for any acute abnormalities. -CT angiogram chest negative for PE, moderate bilateral bronchial wall thickening can be seen in setting of bronchitis, solid pulmonary nodules in the right upper lobe and right middle lobe possibly infectious follow-up CT chest recommended 3 months to ensure resolution, mildly enlarged bilateral hilar lymph nodes likely reactive. -Patient  noted to have been on oxycodone prior to admission which will discontinue and likely will not resume on discharge.. -Continue empiric IV antibiotics for possible pulmonary infection of Rocephin and azithromycin.   -Transition to oral antibiotics tomorrow. -Continue BiPAP nightly -IV fluids, supportive care.  2.  Right upper lobe and right middle lobe nodule/bibasilar atelectasis -CT angiogram chest with right upper lobe and right middle lobe pulmonary nodules possibly infectious as well as changes of bronchitis noted. -Urine strep pneumococcus antigen negative. -Urine Legionella antigen negative. -Respiratory viral panel negative. -SARS coronavirus 2 PCR negative. -Continue empiric IV Rocephin/azithromycin. -Transition to Omnicef/Vantin and continue azithromycin to complete a 5-day course of antibiotic treatment tomorrow. -Continue DuoNebs. -Supportive care.  3.  Chronic hypoxic and hypercapnic respiratory failure/COPD -Patient at baseline on 2 L home O2. -Patient with improvement with wheezing after being started on scheduled DuoNebs.   -Continue scheduled DuoNebs, PPI, Breo Ellipta.   -BiPAP nightly.   -Supportive care.  4.  OSA -Patient noted to be on trilogy vent nightly. -BiPAP nightly while in-house.  5.  Closed left ankle fracture -Patient recently admitted for closed left ankle fracture and noted to have been assessed by orthopedics who recommended conservative management at that time. -Patient currently in short leg splint. -Orthopedics recommended NWB of left lower extremity for 4 to 6 weeks with follow-up in the office midweek next week. -DC oxycodone as felt may be contributing to patient's altered mental status. -PT/OT.  6.  CKD stage IIIa -Stable. -Saline lock IV fluids.  7.  Hypertension -Enalapril.  8.  Hypothyroidism -Synthroid.   9.  Normocytic anemia -Hemoglobin slightly trending down currently at 7.9 likely dilutional effect.  -No overt  bleeding. -Follow.   DVT prophylaxis: Lovenox Code Status:  DNR Family Communication: Updated daughter at bedside. Disposition: Likely SNF  Status is: Inpatient The patient will require care spanning > 2 midnights and should be moved to inpatient because: Severity of illness   Consultants:  None  Procedures:  CT angiogram chest 01/15/2023 CT head 01/15/2023 Chest x-ray 01/15/2023 Lower extremity Dopplers 01/15/2023  Antimicrobials:  Anti-infectives (From admission, onward)    Start     Dose/Rate Route Frequency Ordered Stop   01/16/23 2200  azithromycin (ZITHROMAX) tablet 500 mg        500 mg Oral Daily 01/15/23 2144 01/21/23 2159   01/16/23 2000  cefTRIAXone (ROCEPHIN) 2 g in sodium chloride 0.9 % 100 mL IVPB        2 g 200 mL/hr over 30 Minutes Intravenous Every 24 hours 01/15/23 2144 01/21/23 1959   01/15/23 2000  cefTRIAXone (ROCEPHIN) 1 g in sodium chloride 0.9 % 100 mL IVPB        1 g 200 mL/hr over 30 Minutes Intravenous  Once 01/15/23 1950 01/15/23 2114   01/15/23 2000  azithromycin (ZITHROMAX) 500 mg in dextrose 5 % 250 mL IVPB        500 mg 250 mL/hr over 60 Minutes Intravenous  Once 01/15/23 1950 01/15/23 2223         Subjective: Patient laying in bed.  Denies any chest pain.  Denies any significant shortness of breath.  Alert and oriented to self place and time.  Denies any further hallucinations.  Daughter at bedside feels patient is slowly improving.   Objective: Vitals:   01/16/23 1918 01/17/23 0519 01/17/23 0807 01/17/23 1024  BP: (!) 137/54 (!) 150/58  (!) 150/58  Pulse: 71 79    Resp: 20 (!) 22    Temp: 98.7 F (37.1 C) 99.6 F (37.6 C)    TempSrc: Oral     SpO2: 99% 97% 98%   Weight:  115.1 kg    Height:        Intake/Output Summary (Last 24 hours) at 01/17/2023 1123 Last data filed at 01/17/2023 0500 Gross per 24 hour  Intake 690.6 ml  Output 450 ml  Net 240.6 ml   Filed Weights   01/15/23 1326 01/17/23 0519  Weight: 117.2 kg 115.1 kg     Examination:  General exam: NAD. Respiratory system: Coarse BS.  Minimal expiratory wheezing.  No crackles.  Fair air movement.  Cardiovascular system: RRR no murmurs rubs or gallops.  No JVD.  No pitting lower extremity edema.  Gastrointestinal system: Abdomen is soft, nontender, nondistended, positive bowel sounds.  No rebound.  No guarding.  Central nervous system: Alert and oriented to self place and time.  Moving extremities spontaneously.. No focal neurological deficits. Extremities: Left lower extremity in short cast. Skin: No rashes, lesions or ulcers Psychiatry: Judgement and insight appear fair. Mood & affect appropriate.     Data Reviewed: I have personally reviewed following labs and imaging studies  CBC: Recent Labs  Lab 01/11/23 0701 01/13/23 0319 01/15/23 1307 01/16/23 1053 01/17/23 0610  WBC 8.3 7.4 9.6 11.2* 8.0  NEUTROABS  --   --  7.5  --  6.3  HGB 9.7* 8.5* 8.4* 8.7* 7.9*  HCT 31.7* 27.2* 27.1* 28.0* 25.8*  MCV 96.1 95.1 96.1 95.6 97.0  PLT 190 163 218 271 230    Basic Metabolic Panel: Recent Labs  Lab 01/11/23 0701 01/12/23 0328 01/13/23 0319 01/15/23 1307 01/16/23 1053 01/17/23 0610  NA 139 140 139 138 139 140  K 3.9 4.3 3.9  4.1 3.4* 3.9  CL 92* 94* 94* 97* 96* 99  CO2 37* 36* 36* 32 32 33*  GLUCOSE 114* 122* 112* 104* 171* 118*  BUN 38* 46* 35* 17 15 13   CREATININE 1.47* 1.84* 1.16* 0.99 0.72 0.77  CALCIUM 9.1 8.8* 8.7* 8.9 8.9 8.5*  MG 2.0  --   --   --   --  2.1    GFR: Estimated Creatinine Clearance: 66.2 mL/min (by C-G formula based on SCr of 0.77 mg/dL).  Liver Function Tests: Recent Labs  Lab 01/15/23 1307  AST 18  ALT 14  ALKPHOS 78  BILITOT 0.9  PROT 6.5  ALBUMIN 2.6*    CBG: Recent Labs  Lab 01/16/23 1600  GLUCAP 105*     Recent Results (from the past 240 hour(s))  Resp panel by RT-PCR (RSV, Flu A&B, Covid) Anterior Nasal Swab     Status: None   Collection Time: 01/09/23 11:20 PM   Specimen: Anterior  Nasal Swab  Result Value Ref Range Status   SARS Coronavirus 2 by RT PCR NEGATIVE NEGATIVE Final    Comment: (NOTE) SARS-CoV-2 target nucleic acids are NOT DETECTED.  The SARS-CoV-2 RNA is generally detectable in upper respiratory specimens during the acute phase of infection. The lowest concentration of SARS-CoV-2 viral copies this assay can detect is 138 copies/mL. A negative result does not preclude SARS-Cov-2 infection and should not be used as the sole basis for treatment or other patient management decisions. A negative result may occur with  improper specimen collection/handling, submission of specimen other than nasopharyngeal swab, presence of viral mutation(s) within the areas targeted by this assay, and inadequate number of viral copies(<138 copies/mL). A negative result must be combined with clinical observations, patient history, and epidemiological information. The expected result is Negative.  Fact Sheet for Patients:  BloggerCourse.com  Fact Sheet for Healthcare Providers:  SeriousBroker.it  This test is no t yet approved or cleared by the [place] FDA and  has been authorized for detection and/or diagnosis of SARS-CoV-2 by FDA under an Emergency Use Authorization (EUA). This EUA will remain  in effect (meaning this test can be used) for the duration of the COVID-19 declaration under Section 564(b)(1) of the Act, 21 U.S.C.section 360bbb-3(b)(1), unless the authorization is terminated  or revoked sooner.       Influenza A by PCR NEGATIVE NEGATIVE Final   Influenza B by PCR NEGATIVE NEGATIVE Final    Comment: (NOTE) The Xpert Xpress SARS-CoV-2/FLU/RSV plus assay is intended as an aid in the diagnosis of influenza from Nasopharyngeal swab specimens and should not be used as a sole basis for treatment. Nasal washings and aspirates are unacceptable for Xpert Xpress SARS-CoV-2/FLU/RSV testing.  Fact Sheet for  Patients: BloggerCourse.com  Fact Sheet for Healthcare Providers: SeriousBroker.it  This test is not yet approved or cleared by the [place] FDA and has been authorized for detection and/or diagnosis of SARS-CoV-2 by FDA under an Emergency Use Authorization (EUA). This EUA will remain in effect (meaning this test can be used) for the duration of the COVID-19 declaration under Section 564(b)(1) of the Act, 21 U.S.C. section 360bbb-3(b)(1), unless the authorization is terminated or revoked.     Resp Syncytial Virus by PCR NEGATIVE NEGATIVE Final    Comment: (NOTE) Fact Sheet for Patients: BloggerCourse.com  Fact Sheet for Healthcare Providers: SeriousBroker.it  This test is not yet approved or cleared by the [place] FDA and has been authorized for detection and/or diagnosis of SARS-CoV-2 by FDA  under an Emergency Use Authorization (EUA). This EUA will remain in effect (meaning this test can be used) for the duration of the COVID-19 declaration under Section 564(b)(1) of the Act, 21 U.S.C. section 360bbb-3(b)(1), unless the authorization is terminated or revoked.  Performed at [facility], [address], [place], [place] [address]   Respiratory (~20 pathogens) panel by PCR     Status: None   Collection Time: 01/16/23 12:02 PM   Specimen: Nasopharyngeal Swab; Respiratory  Result Value Ref Range Status   Adenovirus NOT DETECTED NOT DETECTED Final   Coronavirus 229E NOT DETECTED NOT DETECTED Final    Comment: (NOTE) The Coronavirus on the Respiratory Panel, DOES NOT test for the novel  Coronavirus (2019 nCoV)    Coronavirus HKU1 NOT DETECTED NOT DETECTED Final   Coronavirus NL63 NOT DETECTED NOT DETECTED Final   Coronavirus OC43 NOT DETECTED NOT DETECTED Final   Metapneumovirus NOT DETECTED NOT DETECTED Final   Rhinovirus / Enterovirus NOT  DETECTED NOT DETECTED Final   Influenza A NOT DETECTED NOT DETECTED Final   Influenza B NOT DETECTED NOT DETECTED Final   Parainfluenza Virus 1 NOT DETECTED NOT DETECTED Final   Parainfluenza Virus 2 NOT DETECTED NOT DETECTED Final   Parainfluenza Virus 3 NOT DETECTED NOT DETECTED Final   Parainfluenza Virus 4 NOT DETECTED NOT DETECTED Final   Respiratory Syncytial Virus NOT DETECTED NOT DETECTED Final   Bordetella pertussis NOT DETECTED NOT DETECTED Final   Bordetella Parapertussis NOT DETECTED NOT DETECTED Final   Chlamydophila pneumoniae NOT DETECTED NOT DETECTED Final   Mycoplasma pneumoniae NOT DETECTED NOT DETECTED Final    Comment: Performed at [facility], [address], [place], [place] [address]  SARS Coronavirus 2 by RT PCR (hospital order, performed in [facility] hospital lab) *cepheid single result test*     Status: None   Collection Time: 01/16/23 12:02 PM  Result Value Ref Range Status   SARS Coronavirus 2 by RT PCR NEGATIVE NEGATIVE Final    Comment: (NOTE) SARS-CoV-2 target nucleic acids are NOT DETECTED.  The SARS-CoV-2 RNA is generally detectable in upper and lower respiratory specimens during the acute phase of infection. The lowest concentration of SARS-CoV-2 viral copies this assay can detect is 250 copies / mL. A negative result does not preclude SARS-CoV-2 infection and should not be used as the sole basis for treatment or other patient management decisions.  A negative result may occur with improper specimen collection / handling, submission of specimen other than nasopharyngeal swab, presence of viral mutation(s) within the areas targeted by this assay, and inadequate number of viral copies (<250 copies / mL). A negative result must be combined with clinical observations, patient history, and epidemiological information.  Fact Sheet for Patients:   RoadLapTop.co.za  Fact Sheet for Healthcare  Providers: http://kim-miller.com/  This test is not yet approved or  cleared by the [place] FDA and has been authorized for detection and/or diagnosis of SARS-CoV-2 by FDA under an Emergency Use Authorization (EUA).  This EUA will remain in effect (meaning this test can be used) for the duration of the COVID-19 declaration under Section 564(b)(1) of the Act, 21 U.S.C. section 360bbb-3(b)(1), unless the authorization is terminated or revoked sooner.  Performed at [facility], [address] [place], [place] [address]          Radiology Studies: VAS [name] LOWER EXTREMITY VENOUS (DVT) (7a-7p)  Result Date: 01/15/2023  Lower Venous DVT Study Patient Name:  Elpidio Eric  Date of Exam:   01/15/2023 Medical Rec #: [id number]   Accession #:    [id number] Date of Birth: January 27, 1936    Patient Gender: F Patient Age:   66 years Exam Location:  [facility] Procedure:      VAS [name] LOWER EXTREMITY VENOUS (DVT) Referring Phys: Molly Maduro LOCKWOOD --------------------------------------------------------------------------------  Indications: Edema.  Risk Factors: Surgery Trauma. Limitations: Body habitus, poor ultrasound/tissue interface and patient positioning, patient immobility, patient pain tolerance, hard cast. Comparison Study: No prior studies. Performing Technologist: Chanda Busing RVT  Examination Guidelines: A complete evaluation includes B-mode imaging, spectral Doppler, color Doppler, and power Doppler as needed of all accessible portions of each vessel. Bilateral testing is considered an integral part of a complete examination. Limited examinations for reoccurring indications may be performed as noted. The reflux portion of the exam is performed with the patient in reverse Trendelenburg.  +-----+---------------+---------+-----------+----------+--------------+ RIGHTCompressibilityPhasicitySpontaneityPropertiesThrombus Aging  +-----+---------------+---------+-----------+----------+--------------+ CFV  Full           Yes      Yes                                 +-----+---------------+---------+-----------+----------+--------------+   +---------+---------------+---------+-----------+----------+-------------------+ LEFT     CompressibilityPhasicitySpontaneityPropertiesThrombus Aging      +---------+---------------+---------+-----------+----------+-------------------+ CFV      Full           Yes      Yes                                      +---------+---------------+---------+-----------+----------+-------------------+ SFJ      Full                                                             +---------+---------------+---------+-----------+----------+-------------------+ FV Prox  Full                                                             +---------+---------------+---------+-----------+----------+-------------------+ FV Mid                  Yes      Yes                                      +---------+---------------+---------+-----------+----------+-------------------+ FV Distal               Yes      Yes                                      +---------+---------------+---------+-----------+----------+-------------------+ POP                     Yes      Yes                                      +---------+---------------+---------+-----------+----------+-------------------+  PTV      Full                                                             +---------+---------------+---------+-----------+----------+-------------------+ PERO                                                  Not well visualized +---------+---------------+---------+-----------+----------+-------------------+     Summary: RIGHT: - No evidence of common femoral vein obstruction.   LEFT: - There is no evidence of deep vein thrombosis in the lower extremity. However, portions of this examination were  limited- see technologist comments above.  - No cystic structure found in the popliteal fossa.  *See table(s) above for measurements and observations. Electronically signed by Coral Else MD on 01/15/2023 at 9:29:03 PM.    Final    CT Head Wo Contrast  Result Date: 01/15/2023 CLINICAL DATA:  Altered mental status. EXAM: CT HEAD WITHOUT CONTRAST TECHNIQUE: Contiguous axial images were obtained from the base of the skull through the vertex without intravenous contrast. RADIATION DOSE REDUCTION: This exam was performed according to the departmental dose-optimization program which includes automated exposure control, adjustment of the mA and/or kV according to patient size and/or use of iterative reconstruction technique. COMPARISON:  Head CT dated 08/26/2021. FINDINGS: Brain: Mild age-related atrophy and chronic microvascular ischemic changes. There is no acute intracranial hemorrhage. No mass effect or midline shift. No extra-axial fluid collection. Vascular: No hyperdense vessel or unexpected calcification. Skull: Normal. Negative for fracture or focal lesion. Sinuses/Orbits: No acute finding. Other: None IMPRESSION: 1. No acute intracranial pathology. 2. Mild age-related atrophy and chronic microvascular ischemic changes. Electronically Signed   By: Elgie Collard M.D.   On: 01/15/2023 17:59   CT Angio Chest PE W/Cm &/Or Wo Cm  Result Date: 01/15/2023 CLINICAL DATA:  Mental status change EXAM: CT ANGIOGRAPHY CHEST WITH CONTRAST TECHNIQUE: Multidetector CT imaging of the chest was performed using the standard protocol during bolus administration of intravenous contrast. Multiplanar CT image reconstructions and MIPs were obtained to evaluate the vascular anatomy. RADIATION DOSE REDUCTION: This exam was performed according to the departmental dose-optimization program which includes automated exposure control, adjustment of the mA and/or kV according to patient size and/or use of iterative reconstruction  technique. CONTRAST:  75mL OMNIPAQUE IOHEXOL 350 MG/ML SOLN COMPARISON:  None Available. FINDINGS: Cardiovascular: No evidence of central pulmonary embolus. Limits evaluation of the segmental and subsegmental pulmonary arteries due to bolus timing and motion artifact. Mild cardiomegaly. Normal caliber thoracic aorta with moderate atherosclerotic disease. Mild coronary artery calcifications. Mediastinum/Nodes: Esophagus thyroid are unremarkable. Mildly enlarged bilateral hilar lymph nodes which are likely reactive. Reference right hilar lymph node measuring 13 mm in short axis on series 6, image 52. Lungs/Pleura: Central airways are patent. Moderate bilateral bronchial wall thickening. Clustered solid nodules of the right upper lobe largest measures 13 x 8 mm on series 14, image 58. Additional solid nodule of the right middle lobe measuring 7 mm on series 14, image 84. Left-greater-than-right atelectasis of the lower lobes. No pleural effusion or pneumothorax. 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 Musculoskeletal:  No chest wall abnormality. No acute or significant osseous findings. Review of the MIP images confirms the above findings. IMPRESSION: 1. No evidence of central pulmonary embolus. Limits evaluation of the segmental and subsegmental pulmonary arteries due to bolus timing and motion artifact. 2. Moderate bilateral bronchial wall thickening, findings can be seen in the setting of bronchitis. 3. Solid pulmonary nodules of the right upper lobe and right middle lobe, possibly infectious, largest measures up to 13 mm. Follow-up chest CT is recommended in 3 months to ensure resolution. 4. Mildly enlarged bilateral hilar lymph nodes which are likely reactive. 5. 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 6. Aortic Atherosclerosis (ICD10-I70.0). Electronically Signed   By: Allegra Lai M.D.   On: 01/15/2023 16:35   DG Chest Port 1  View  Result Date: 01/15/2023 CLINICAL DATA:  Fatigue. EXAM: PORTABLE CHEST 1 VIEW COMPARISON:  January 09, 2023. FINDINGS: Stable cardiomegaly. Minimal bibasilar subsegmental atelectasis is noted. The visualized skeletal structures are unremarkable. IMPRESSION: Minimal bibasilar subsegmental atelectasis. Electronically Signed   By: Lupita Raider M.D.   On: 01/15/2023 16:26        Scheduled Meds:  azithromycin  500 mg Oral Daily   cyanocobalamin  500 mcg Oral Daily   enalapril  20 mg Oral BID   enoxaparin (LOVENOX) injection  55 mg Subcutaneous Q24H   fluticasone furoate-vilanterol  1 puff Inhalation Daily   ipratropium-albuterol  3 mL Nebulization TID   levothyroxine  175 mcg Oral Q0600   pantoprazole  40 mg Oral BID AC   senna  1 tablet Oral BID   Continuous Infusions:  cefTRIAXone (ROCEPHIN)  IV 2 g (01/16/23 2004)     LOS: 1 day    Time spent: 40 minutes    Ramiro Harvest, MD [facility]   To contact the attending provider between 7A-7P or the covering provider during after hours 7P-7A, please log into the web site [email or web address] and access using universal [place] password for that web site. If you do not have the password, please call the hospital operator.  01/17/2023, 11:23 AM

## 2023-01-18 DIAGNOSIS — N1831 Chronic kidney disease, stage 3a: Secondary | ICD-10-CM | POA: Diagnosis not present

## 2023-01-18 DIAGNOSIS — J9611 Chronic respiratory failure with hypoxia: Secondary | ICD-10-CM | POA: Diagnosis not present

## 2023-01-18 DIAGNOSIS — I10 Essential (primary) hypertension: Secondary | ICD-10-CM | POA: Diagnosis not present

## 2023-01-18 DIAGNOSIS — R4182 Altered mental status, unspecified: Secondary | ICD-10-CM | POA: Diagnosis not present

## 2023-01-18 LAB — BASIC METABOLIC PANEL
Anion gap: 10 (ref 5–15)
BUN: 11 mg/dL (ref 8–23)
CO2: 33 mmol/L — ABNORMAL HIGH (ref 22–32)
Calcium: 8.6 mg/dL — ABNORMAL LOW (ref 8.9–10.3)
Chloride: 100 mmol/L (ref 98–111)
Creatinine, Ser: 0.64 mg/dL (ref 0.44–1.00)
GFR, Estimated: >60 mL/min (ref >60)
Glucose, Bld: 121 mg/dL — ABNORMAL HIGH (ref 70–99)
Potassium: 3.8 mmol/L (ref 3.5–5.1)
Sodium: 143 mmol/L (ref 135–145)

## 2023-01-18 LAB — CBC
HCT: 25 % — ABNORMAL LOW (ref 36.0–46.0)
Hemoglobin: 7.7 g/dL — ABNORMAL LOW (ref 12.0–15.0)
MCH: 29.8 pg (ref 26.0–34.0)
MCHC: 30.8 g/dL (ref 30.0–36.0)
MCV: 96.9 fL (ref 80.0–100.0)
Platelets: 282 10*3/uL (ref 150–400)
RBC: 2.58 MIL/uL — ABNORMAL LOW (ref 3.87–5.11)
RDW: 15.8 % — ABNORMAL HIGH (ref 11.5–15.5)
WBC: 8.5 10*3/uL (ref 4.0–10.5)
nRBC: 0 % (ref 0.0–0.2)

## 2023-01-18 MED ORDER — CEFDINIR 300 MG PO CAPS
300.0000 mg | ORAL_CAPSULE | Freq: Two times a day (BID) | ORAL | Status: AC
  Administered 2023-01-18 – 2023-01-21 (×6): 300 mg via ORAL
  Filled 2023-01-18 (×6): qty 1

## 2023-01-18 MED ORDER — FUROSEMIDE 10 MG/ML IJ SOLN
20.0000 mg | Freq: Once | INTRAMUSCULAR | Status: AC
  Administered 2023-01-18: 20 mg via INTRAVENOUS
  Filled 2023-01-18: qty 2

## 2023-01-18 NOTE — Progress Notes (Signed)
   01/18/23 1922  BiPAP/CPAP/SIPAP  BiPAP/CPAP/SIPAP Pt Type Adult  Reason BIPAP/CPAP not in use Non-compliant

## 2023-01-18 NOTE — Evaluation (Signed)
Occupational Therapy Evaluation Patient Details Name: Valerie Bowman MRN: [id number] DOB: [birth year] Today's Date: 01/18/2023   History of Present Illness 87 yo female presents to hospital on  01/15/2023 due to AMS and lethargy with pt transitioning from SNF for short term rehab following hospitalization   10/31-11/5 secondary to mechanical fall at [facility]. Pt found to have  RUL and RML pulmonary nodules on CTA and possible infectious process.Fall resulted in L ankle trimalleolar fx, orthopedics recommends conservative measures at this time due to extensive edema. Pt is L LE NWB. Pt PMH includes but is not limited to: falls, hypothyroidism, COPD, HTN, chronic HFpEF and GERD.   Clinical Impression   Patient is a 87 year old female who was admitted for above. Patient was recently at [facility] after fall with NWB status on LLE. Currently, patient is too lethargic to attempt EOB. Patient reported that it was hard to participate in coordination testing with "seeing more than one". Patient falling asleep with attempts to further assess. Glasses were already in place. Patient's nurse made aware of concerns for vision reports and level of lethargy. Nurse verbalized understanding. Patient would continue to benefit from skilled OT services at this time while admitted and after d/c to address noted deficits in order to improve overall safety and independence in ADLs. Patient will benefit from continued inpatient follow up therapy, <3 hours/day        If plan is discharge home, recommend the following: Assistance with cooking/housework;Supervision due to cognitive status;Assistance with feeding;Assist for transportation;Help with stairs or ramp for entrance;Two people to help with bathing/dressing/bathroom;Two people to help with walking and/or transfers;A lot of help with bathing/dressing/bathroom;A lot of help with walking and/or transfers    Functional Status Assessment  Patient has had a recent decline in  their functional status and demonstrates the ability to make significant improvements in function in a reasonable and predictable amount of time.  Equipment Recommendations  None recommended by OT       Precautions / Restrictions Precautions Precautions: Fall Restrictions LLE Weight Bearing: Non weight bearing      Mobility Bed Mobility Overal bed mobility: Needs Assistance    Patient was too lethargic to attempt EOB did not change significantly enough with chair positioning at bed level                           ADL either performed or assessed with clinical judgement   ADL Overall ADL's : Needs assistance/impaired Eating/Feeding: Bed level;Maximal assistance Eating/Feeding Details (indicate cue type and reason): unable to hold cup when awake and alert asking for sip of water nurse made aware Grooming: Total assistance;Bed level Grooming Details (indicate cue type and reason): attempted to wash face with wash cloth with patient minimally reaching chin and then falling asleep.reported she washed her whole face when asked. Upper Body Bathing: Bed level;Total assistance   Lower Body Bathing: Bed level;Total assistance   Upper Body Dressing : Bed level;Total assistance   Lower Body Dressing: Bed level;Total assistance     Toilet Transfer Details (indicate cue type and reason): Unable Toileting- Clothing Manipulation and Hygiene: Total assistance;+2 for physical assistance;Bed level               Vision Baseline Vision/History: 1 Wears glasses Ability to See in Adequate Light: 1 Impaired Patient Visual Report: Diplopia Vision Assessment?: Vision impaired- to be further tested in functional context Additional Comments: reported it was hard to see finger for  finger to nose test. hand notably drifting to left of wher therapists hand was located in front of her. glasses were in place.            Pertinent Vitals/Pain Pain Assessment Pain Assessment: No/denies  pain     Extremity/Trunk Assessment Upper Extremity Assessment Upper Extremity Assessment: Right hand dominant;LUE deficits/detail;RUE deficits/detail RUE Sensation: history of peripheral neuropathy RUE Coordination: decreased fine motor;decreased gross motor LUE Sensation: history of peripheral neuropathy LUE Coordination: decreased fine motor;decreased gross motor   Lower Extremity Assessment Lower Extremity Assessment: Defer to PT evaluation       Communication Communication Communication: Difficulty following commands/understanding;Hearing impairment   Cognition Arousal: Lethargic Behavior During Therapy: Flat affect Overall Cognitive Status: Impaired/Different from baseline                   Orientation Level: Person, Place             General Comments: did not know time orientation questions knew she was in hospital.                Home Living Family/patient expects to be discharged to:: Assisted living           Home Equipment: Rolling Walker (2 wheels);Wheelchair - manual;Lift chair   Additional Comments: [facility]. pt required A for all functional mobility tasks, primarily used wc, however dependent for mobility and slept in a lift chair.  Pt's wheelchair is without LE supports.      Prior Functioning/Environment Prior Level of Function : Needs assist;History of Falls (last six months)       Physical Assist : ADLs (physical);Mobility (physical) Mobility (physical): Transfers;Bed mobility ADLs (physical): Grooming;Dressing;Bathing;IADLs;Toileting Mobility Comments: pt required A for all functional mobility tasks primarily at wc level ADLs Comments: staff assist wtih medication management, ADLs and meals. Pt voids into a brief and staff performs hygiene at bed level.        OT Problem List: Decreased strength;Decreased knowledge of use of DME or AE;Impaired tone;Increased edema;Obesity;Decreased knowledge of precautions;Decreased  coordination;Decreased range of motion;Decreased activity tolerance;Impaired UE functional use;Impaired sensation;Pain;Impaired balance (sitting and/or standing)      OT Treatment/Interventions: Self-care/ADL training;Balance training;Therapeutic exercise;Therapeutic activities;DME and/or AE instruction;Visual/perceptual remediation/compensation    OT Goals(Current goals can be found in the care plan section) Acute Rehab OT Goals Patient Stated Goal: "water" OT Goal Formulation: Patient unable to participate in goal setting Time For Goal Achievement: 02/01/23 Potential to Achieve Goals: Fair  OT Frequency: Min 1X/week       AM-PAC OT "6 Clicks" Daily Activity     Outcome Measure Help from another person eating meals?: A Lot Help from another person taking care of personal grooming?: A Lot Help from another person toileting, which includes using toliet, bedpan, or urinal?: Total Help from another person bathing (including washing, rinsing, drying)?: Total Help from another person to put on and taking off regular upper body clothing?: Total Help from another person to put on and taking off regular lower body clothing?: Total 6 Click Score: 8   End of Session Nurse Communication: Other (comment) (double vision report and lethargy)  Activity Tolerance: Patient limited by fatigue;Patient limited by lethargy Patient left: in bed;with call bell/phone within reach;with bed alarm set  OT Visit Diagnosis: Pain;History of falling (Z91.81);Muscle weakness (generalized) (M62.81);Repeated falls (R29.6);Apraxia (R48.2);Feeding difficulties (R63.3)                Time: 0623-7628 OT Time Calculation (min): 12 min Charges:  OT  General Charges $OT Visit: 1 Visit OT Evaluation $OT Eval Low Complexity: 1 Low  Cuong Moorman OTR/L, MS Acute Rehabilitation Department Office# [phone]   Selinda Flavin 01/18/2023, 1:20 PM

## 2023-01-18 NOTE — Progress Notes (Signed)
Patient ID: Valerie Bowman, female   DOB: [birth year], 87 y.o.   MRN: [id number] I did come by the bedside this morning to check on the patient.  Fortunately her daughter is also at the bedside.  The patient did not tolerate oxycodone well and got significant confused when she was at skilled nursing and was rehospitalized here at [facility].  She does have a left ankle fracture.  She is alert and oriented this morning.  Again the daughter is at the bedside.  I ordered x-rays of the left ankle yesterday and did review those yesterday and went over the x-rays with the daughter at the bedside.  On exam, her left ankle cast is fitting appropriately.  The x-rays show some shortening of the fibula but the ankle mortise is congruent and I feel that it is in acceptable alignment to get this to heal for someone who is of low physical demand.  She will be nonweightbearing for up to 6 weeks on that left ankle.  I talked to the daughter about this in length in detail and she understands we will get her back in the office in 2 weeks to remove that cast and to likely placed 1 more cast on the ankle.  She should remain nonweightbearing in the interim on her left lower extremity.  I would recommend only Tylenol as needed for pain and then Ultram if there is severe pain as needed.

## 2023-01-18 NOTE — Plan of Care (Signed)
  Problem: Respiratory: Goal: Ability to maintain a clear airway will improve Outcome: Progressing   Problem: Clinical Measurements: Goal: Diagnostic test results will improve Outcome: Progressing   Problem: Nutrition: Goal: Adequate nutrition will be maintained Outcome: Progressing   Problem: Pain Management: Goal: General experience of comfort will improve Outcome: Progressing

## 2023-01-18 NOTE — Plan of Care (Signed)

## 2023-01-18 NOTE — TOC Progression Note (Signed)
Transition of Care [facility]) - Progression Note    Patient Details  Name: Valerie Bowman MRN: [id number] Date of Birth: 1935/06/05  [facility] CM/SW Contact  Georgie Chard, LCSW Phone Number: 01/18/2023, 11:11 AM  Clinical Narrative:    CSW spoke to patient's daughter. At this time patient's daughter does not want mom to back to [facility], due to daughter's own concerns. The patient's daughter would like CSW to send mom out to other facilites. Daughter picked three facilites that she has a preference for [employer] or [facility] [place]. This CSW did explain the SNF process and made the daughter aware that we can send out to those facilites however we have no control over bed offers. Daughter has reported that she understands. This CSW has informed MD as well nurse. This CSW has started a new FL2 which was re-sent out through the [facility]. TOC will continue to follow.    Expected Discharge Plan: Skilled Nursing Facility Barriers to Discharge: No Barriers Identified  Expected Discharge Plan and Services In-house Referral: Clinical Social Work Discharge Planning Services: NA Post Acute Care Choice: Skilled Nursing Facility Living arrangements for the past 2 months: Assisted Living Facility                 DME Arranged: N/A DME Agency: NA                   Social Determinants of Health (SDOH) Interventions SDOH Screenings   Food Insecurity: No Food Insecurity (01/16/2023)  Housing: Low Risk  (01/16/2023)  Transportation Needs: No Transportation Needs (01/16/2023)  Utilities: Not At Risk (01/16/2023)  Financial Resource Strain: Low Risk  (09/10/2020)   Received from [facility]  Physical Activity: Inactive (09/10/2020)   Received from [facility]  Social Connections: Moderately Isolated (09/10/2020)   Received from [facility]  Stress: No Stress Concern Present (09/10/2020)   Received from [facility]  Tobacco Use: Medium Risk (01/15/2023)    Readmission Risk  Interventions     No data to display

## 2023-01-18 NOTE — Progress Notes (Signed)
PROGRESS NOTE    Valerie Bowman  MVH:[id number] DOB: [birth year] DOA: 01/15/2023 PCP: Smiley Houseman, NP    Chief Complaint  Patient presents with   Altered Mental Status    Brief Narrative:  Patient is a 87 year old female history of COPD, chronic respiratory failure with hypoxia and hypercapnia on 2 L nasal cannula at baseline, chronic HFpEF, CKD stage IIIa, hypertension, hyperlipidemia, anemia, OSA on trilogy vent nightly presented to the ED with altered mental status after recent discharge hospitalization 01/09/2023-01/14/2023 for close left ankle fracture after mechanical fall.   Assessment & Plan:   Principal Problem:   Altered mental status Active Problems:   Chronic respiratory failure with hypoxia and hypercapnia (HCC)   COPD, severe (HCC)   Acquired hypothyroidism   Essential hypertension   OSA (obstructive sleep apnea)   Closed left ankle fracture   Chronic kidney disease, stage 3a (HCC)   Acute metabolic encephalopathy   AMS (altered mental status)   Abnormal chest x-ray with multiple lung nodules  #1 altered mental status -Likely multifactorial secondary to potential pulmonary infection, narcotic pain medications, possible nonadherence to trilogy vent overnight. -Patient noted to have reported some hallucinations which has since improved.. -Patient more alert today oriented to self place and time.  Patient knows who the president is currently and who the incoming president is going to be. -Urinalysis nitrite negative, leukocytes negative. -Urine strep pneumococcus antigen is negative. -CT head negative for any acute abnormalities. -CT angiogram chest negative for PE, moderate bilateral bronchial wall thickening can be seen in setting of bronchitis, solid pulmonary nodules in the right upper lobe and right middle lobe possibly infectious follow-up CT chest recommended 3 months to ensure resolution, mildly enlarged bilateral hilar lymph nodes likely reactive. -Patient  noted to have been on oxycodone prior to admission which has been discontinued and likely will not be resumed on discharge.   -Continue empiric antibiotics for possible pulmonary infection.   -Patient with some lethargy today.  -Saline lock IV fluids.   -Lasix 20 mg IV x 1.   -Continue BiPAP nightly -IV fluids, supportive care.  2.  Right upper lobe and right middle lobe nodule/bibasilar atelectasis -CT angiogram chest with right upper lobe and right middle lobe pulmonary nodules possibly infectious as well as changes of bronchitis noted. -Urine strep pneumococcus antigen negative. -Urine Legionella antigen negative. -Respiratory viral panel negative. -SARS coronavirus 2 PCR negative. -Was on empiric IV Rocephin/azithromycin. -Transition to Omnicef/Vantin today and continue azithromycin to complete a 5-day course of antibiotic treatment. -Continue DuoNebs. -Supportive care.  3.  Chronic hypoxic and hypercapnic respiratory failure/COPD -Patient at baseline on 2 L home O2. -Patient with improvement with wheezing after being started on scheduled DuoNebs.   -Continue scheduled DuoNebs, PPI, Breo Ellipta.   -BiPAP nightly.   -Saline lock IV fluids. -Supportive care.  4.  OSA -Patient noted to be on trilogy vent nightly. -BiPAP nightly while in-house.  5.  Closed left ankle fracture -Patient recently admitted for closed left ankle fracture and noted to have been assessed by orthopedics who recommended conservative management at that time. -Patient currently in short leg splint. -Orthopedics recommended NWB of left lower extremity for 4 to 6 weeks with follow-up in the office midweek next week. -DC oxycodone as felt may be contributing to patient's altered mental status. -PT/OT. -Patient underwent plain films of the left ankle on 01/17/2023 which showed fracture still present along the distal fibula and medial malleolus.  Cast in place.  Level of displacement of  distal fibular fracture  appears increased from prior x-rays slightly. -Patient assessed by orthopedics, Dr. Magnus Ivan who reviewed the films and recommended NWB for up to 6 weeks on the left ankle, close outpatient follow-up in 2 weeks postdischarge to remove the cast and to likely placed 1 more cast on the ankle. -Due to presentation with lethargy orthopedics recommending Tylenol as needed for pain and Ultram as needed for severe pain.  6.  CKD stage IIIa -Stable. -Saline lock IV fluids.  7.  Hypertension -Continue enalapril.  8.  Hypothyroidism -Continue Synthroid.   9.  Normocytic anemia -Hemoglobin slightly trending down currently at 7.7 likely dilutional effect.  -No overt bleeding. -Follow.   DVT prophylaxis: Lovenox Code Status: DNR Family Communication: Updated daughter at bedside. Disposition: Likely SNF  Status is: Inpatient The patient will require care spanning > 2 midnights and should be moved to inpatient because: Severity of illness   Consultants:  None  Procedures:  CT angiogram chest 01/15/2023 CT head 01/15/2023 Chest x-ray 01/15/2023 Lower extremity Dopplers 01/15/2023  Antimicrobials:  Anti-infectives (From admission, onward)    Start     Dose/Rate Route Frequency Ordered Stop   01/18/23 2000  cefdinir (OMNICEF) capsule 300 mg        300 mg Oral Every 12 hours 01/18/23 0841 01/21/23 2159   01/16/23 2200  azithromycin (ZITHROMAX) tablet 500 mg        500 mg Oral Daily 01/15/23 2144 01/21/23 2159   01/16/23 2000  cefTRIAXone (ROCEPHIN) 2 g in sodium chloride 0.9 % 100 mL IVPB  Status:  Discontinued        2 g 200 mL/hr over 30 Minutes Intravenous Every 24 hours 01/15/23 2144 01/18/23 0841   01/15/23 2000  cefTRIAXone (ROCEPHIN) 1 g in sodium chloride 0.9 % 100 mL IVPB        1 g 200 mL/hr over 30 Minutes Intravenous  Once 01/15/23 1950 01/15/23 2114   01/15/23 2000  azithromycin (ZITHROMAX) 500 mg in dextrose 5 % 250 mL IVPB        500 mg 250 mL/hr over 60 Minutes  Intravenous  Once 01/15/23 1950 01/15/23 2223         Subjective: Patient lying in bed sleeping but arousable.  Somewhat groggy this morning.  Patient denies any chest pain.  Denies any significant shortness of breath.  Feels wheezing has improved.  Daughter states patient still with hallucinations.  Daughter prefers patient to go to a different SNF.  Patient alert oriented to self place and time.  Knows who the president is.    Objective: Vitals:   01/17/23 2038 01/18/23 0500 01/18/23 0541 01/18/23 0554  BP: (!) 136/50  (!) 128/53   Pulse: 85  73   Resp: 18  18   Temp: 98.8 F (37.1 C)  99.2 F (37.3 C)   TempSrc: Oral  Oral   SpO2: 94%  97% 95%  Weight:  116.6 kg    Height:        Intake/Output Summary (Last 24 hours) at 01/18/2023 1201 Last data filed at 01/18/2023 0900 Gross per 24 hour  Intake 320 ml  Output 800 ml  Net -480 ml   Filed Weights   01/15/23 1326 01/17/23 0519 01/18/23 0500  Weight: 117.2 kg 115.1 kg 116.6 kg    Examination:  General exam: NAD. Respiratory system: Some scattered coarse breath sounds.  Minimal expiratory wheezing. Fair air movement.  Cardiovascular system: Regular rate rhythm no murmurs rubs or gallops.  No  JVD.  No pitting lower extremity edema.  Gastrointestinal system: Abdomen is soft, nontender, nondistended, positive bowel sounds.  No rebound.  No guarding.   Central nervous system: Alert and oriented to self place and time.  Moving extremities spontaneously.. No focal neurological deficits. Extremities: Left lower extremity in short cast. Skin: No rashes, lesions or ulcers Psychiatry: Judgement and insight appear fair. Mood & affect appropriate.     Data Reviewed: I have personally reviewed following labs and imaging studies  CBC: Recent Labs  Lab 01/13/23 0319 01/15/23 1307 01/16/23 1053 01/17/23 0610 01/18/23 0700  WBC 7.4 9.6 11.2* 8.0 8.5  NEUTROABS  --  7.5  --  6.3  --   HGB 8.5* 8.4* 8.7* 7.9* 7.7*  HCT 27.2*  27.1* 28.0* 25.8* 25.0*  MCV 95.1 96.1 95.6 97.0 96.9  PLT 163 218 271 230 282    Basic Metabolic Panel: Recent Labs  Lab 01/13/23 0319 01/15/23 1307 01/16/23 1053 01/17/23 0610 01/18/23 0700  NA 139 138 139 140 143  K 3.9 4.1 3.4* 3.9 3.8  CL 94* 97* 96* 99 100  CO2 36* 32 32 33* 33*  GLUCOSE 112* 104* 171* 118* 121*  BUN 35* 17 15 13 11   CREATININE 1.16* 0.99 0.72 0.77 0.64  CALCIUM 8.7* 8.9 8.9 8.5* 8.6*  MG  --   --   --  2.1  --     GFR: Estimated Creatinine Clearance: 66.7 mL/min (by C-G formula based on SCr of 0.64 mg/dL).  Liver Function Tests: Recent Labs  Lab 01/15/23 1307  AST 18  ALT 14  ALKPHOS 78  BILITOT 0.9  PROT 6.5  ALBUMIN 2.6*    CBG: Recent Labs  Lab 01/16/23 1600  GLUCAP 105*     Recent Results (from the past 240 hour(s))  Resp panel by RT-PCR (RSV, Flu A&B, Covid) Anterior Nasal Swab     Status: None   Collection Time: 01/09/23 11:20 PM   Specimen: Anterior Nasal Swab  Result Value Ref Range Status   SARS Coronavirus 2 by RT PCR NEGATIVE NEGATIVE Final    Comment: (NOTE) SARS-CoV-2 target nucleic acids are NOT DETECTED.  The SARS-CoV-2 RNA is generally detectable in upper respiratory specimens during the acute phase of infection. The lowest concentration of SARS-CoV-2 viral copies this assay can detect is 138 copies/mL. A negative result does not preclude SARS-Cov-2 infection and should not be used as the sole basis for treatment or other patient management decisions. A negative result may occur with  improper specimen collection/handling, submission of specimen other than nasopharyngeal swab, presence of viral mutation(s) within the areas targeted by this assay, and inadequate number of viral copies(<138 copies/mL). A negative result must be combined with clinical observations, patient history, and epidemiological information. The expected result is Negative.  Fact Sheet for Patients:   BloggerCourse.com  Fact Sheet for Healthcare Providers:  SeriousBroker.it  This test is no t yet approved or cleared by the [place] FDA and  has been authorized for detection and/or diagnosis of SARS-CoV-2 by FDA under an Emergency Use Authorization (EUA). This EUA will remain  in effect (meaning this test can be used) for the duration of the COVID-19 declaration under Section 564(b)(1) of the Act, 21 U.S.C.section 360bbb-3(b)(1), unless the authorization is terminated  or revoked sooner.       Influenza A by PCR NEGATIVE NEGATIVE Final   Influenza B by PCR NEGATIVE NEGATIVE Final    Comment: (NOTE) The Xpert Xpress SARS-CoV-2/FLU/RSV plus assay is  intended as an aid in the diagnosis of influenza from Nasopharyngeal swab specimens and should not be used as a sole basis for treatment. Nasal washings and aspirates are unacceptable for Xpert Xpress SARS-CoV-2/FLU/RSV testing.  Fact Sheet for Patients: BloggerCourse.com  Fact Sheet for Healthcare Providers: SeriousBroker.it  This test is not yet approved or cleared by the [place] FDA and has been authorized for detection and/or diagnosis of SARS-CoV-2 by FDA under an Emergency Use Authorization (EUA). This EUA will remain in effect (meaning this test can be used) for the duration of the COVID-19 declaration under Section 564(b)(1) of the Act, 21 U.S.C. section 360bbb-3(b)(1), unless the authorization is terminated or revoked.     Resp Syncytial Virus by PCR NEGATIVE NEGATIVE Final    Comment: (NOTE) Fact Sheet for Patients: BloggerCourse.com  Fact Sheet for Healthcare Providers: SeriousBroker.it  This test is not yet approved or cleared by the [place] FDA and has been authorized for detection and/or diagnosis of SARS-CoV-2 by FDA under an Emergency Use  Authorization (EUA). This EUA will remain in effect (meaning this test can be used) for the duration of the COVID-19 declaration under Section 564(b)(1) of the Act, 21 U.S.C. section 360bbb-3(b)(1), unless the authorization is terminated or revoked.  Performed at [facility], [address] [place], [place] [address]   Respiratory (~20 pathogens) panel by PCR     Status: None   Collection Time: 01/16/23 12:02 PM   Specimen: Nasopharyngeal Swab; Respiratory  Result Value Ref Range Status   Adenovirus NOT DETECTED NOT DETECTED Final   Coronavirus 229E NOT DETECTED NOT DETECTED Final    Comment: (NOTE) The Coronavirus on the Respiratory Panel, DOES NOT test for the novel  Coronavirus (2019 nCoV)    Coronavirus HKU1 NOT DETECTED NOT DETECTED Final   Coronavirus NL63 NOT DETECTED NOT DETECTED Final   Coronavirus OC43 NOT DETECTED NOT DETECTED Final   Metapneumovirus NOT DETECTED NOT DETECTED Final   Rhinovirus / Enterovirus NOT DETECTED NOT DETECTED Final   Influenza A NOT DETECTED NOT DETECTED Final   Influenza B NOT DETECTED NOT DETECTED Final   Parainfluenza Virus 1 NOT DETECTED NOT DETECTED Final   Parainfluenza Virus 2 NOT DETECTED NOT DETECTED Final   Parainfluenza Virus 3 NOT DETECTED NOT DETECTED Final   Parainfluenza Virus 4 NOT DETECTED NOT DETECTED Final   Respiratory Syncytial Virus NOT DETECTED NOT DETECTED Final   Bordetella pertussis NOT DETECTED NOT DETECTED Final   Bordetella Parapertussis NOT DETECTED NOT DETECTED Final   Chlamydophila pneumoniae NOT DETECTED NOT DETECTED Final   Mycoplasma pneumoniae NOT DETECTED NOT DETECTED Final    Comment: Performed at [facility], [address] [place], [place] [address]  SARS Coronavirus 2 by RT PCR (hospital order, performed in [facility] hospital lab) *cepheid single result test*     Status: None   Collection Time: 01/16/23 12:02 PM  Result Value Ref Range Status   SARS Coronavirus 2 by RT  PCR NEGATIVE NEGATIVE Final    Comment: (NOTE) SARS-CoV-2 target nucleic acids are NOT DETECTED.  The SARS-CoV-2 RNA is generally detectable in upper and lower respiratory specimens during the acute phase of infection. The lowest concentration of SARS-CoV-2 viral copies this assay can detect is 250 copies / mL. A negative result does not preclude SARS-CoV-2 infection and should not be used as the sole basis for treatment or other patient management decisions.  A negative result may occur with improper specimen collection / handling, submission  of specimen other than nasopharyngeal swab, presence of viral mutation(s) within the areas targeted by this assay, and inadequate number of viral copies (<250 copies / mL). A negative result must be combined with clinical observations, patient history, and epidemiological information.  Fact Sheet for Patients:   RoadLapTop.co.za  Fact Sheet for Healthcare Providers: http://kim-miller.com/  This test is not yet approved or  cleared by the [place] FDA and has been authorized for detection and/or diagnosis of SARS-CoV-2 by FDA under an Emergency Use Authorization (EUA).  This EUA will remain in effect (meaning this test can be used) for the duration of the COVID-19 declaration under Section 564(b)(1) of the Act, 21 U.S.C. section 360bbb-3(b)(1), unless the authorization is terminated or revoked sooner.  Performed at [facility], [address] [place], [place] [address]          Radiology Studies: DG Swallowing Func-Speech Pathology  Result Date: 01/17/2023 Table formatting from the original result was not included. Modified Barium Swallow Study Patient Details Name: Eastyn Boomsma MRN: [id number] Date of Birth: [birth year] Today's Date: 01/17/2023 HPI/PMH: HPI: Tarshia Kettlewell is a 87 y.o. female with medical history significant for COPD, chronic respiratory failure with hypoxia  and hypercapnia on 2 L O2 via [place] at baseline, chronic HFpEF, CKD stage IIIa, HTN, HLD, anemia, OSA on trilogy vent nightly who presented to the ED from SNF for evaluation of altered mental status. .10/31-11/07/2022 was here.   PT/OT recommended SNF and patient was discharged to SNF.  Patient uses trilogy vent at night at baseline. She was not tolerating throughout the night and kept taking it off.She had a similar episode in September when she had the COVID-19 viral infection which eventually resolved on its own.Patient reports a chronic cough productive of white sputum which has not changed from baseline.  She denies chest pain, dyspnea, abdominal pain, dysuria, diarrhea.  Pt has undergone BSE yesterday - and RN reports pt with overtly coughing and ongoing concern for aspiration.  Thus SLP spoke to MD and he agreed to order MBS.  Pt had prior MBS and was coughing without asp observed.  Transient penetration previously noted. Clinical Impression: Clinical Impression: Patient presents with mild oropharynbeal dysphagia -  This represents a diminished ability and swallowing compared to prior MBS. Her oral dysphagia dysphagia is due to impaired coordination/control- allowing spillage of liquids into pharynx-- trachea poorly controlled.  Audible aspiration of thin liquid and trace silent aspiration of thin and large bolus of nectar occured.  Pharyngeal swallow dysfunction chacterized by impaired timing of swallow, inadequacy of laryngeal vestibule closure and elevation.  Postures including chin tuck and separately head of the bed reclined to approximately 45 degrees prevents patient from aspirating liquids.  Patient was hesitant to conduct either strategy stating she looks like a woodpecker and/or she feels uncomfortable swallowing in this position.  Small single sips of nectar thick liquid was tolerated without aspiration.  Though aspiration was not gross consistent aspiration over time likely can contribute to potential  pulmonary infections.  As patient is currently in the hospital recommend nectar thick liquids during meals and implement [employer] protocol.  Medicine with pure whole advised. Patient will benefit from follow-up SLP to address dysphagia and train compensation strategies for maximal airway protection.  Using teach back with recorded video patient educated to recommendations.  Of note cough was correlated to aspiration during today's evaluation-again different than prior findings. Factors that may increase risk of adverse event in presence of aspiration Rubye Oaks &  Clearance Coots 2021): Factors that may increase risk of adverse event in presence of aspiration Rubye Oaks & Clearance Coots 2021): Frail or deconditioned; Respiratory or GI disease Recommendations/Plan: Swallowing Evaluation Recommendations Swallowing Evaluation Recommendations Recommendations: PO diet PO Diet Recommendation: Regular; Mildly thick liquids (Level 2, nectar thick) Liquid Administration via: Cup; Straw Medication Administration: Whole meds with puree Supervision: Intermittent supervision/cueing for swallowing strategies Swallowing strategies  : Small bites/sips; Slow rate Postural changes: Stay upright 30-60 min after meals Oral care recommendations: Oral care BID (2x/day) Treatment Plan Treatment Plan Treatment recommendations: Therapy as outlined in treatment plan below Follow-up recommendations: Skilled nursing-short term rehab (<3 hours/day) Functional status assessment: Patient has had a recent decline in their functional status and demonstrates the ability to make significant improvements in function in a reasonable and predictable amount of time. Treatment frequency: Min 1x/week Treatment duration: 1 week Interventions: Aspiration precaution training; Compensatory techniques; Patient/family education; Oropharyngeal exercises Recommendations Recommendations for follow up therapy are one component of a multi-disciplinary discharge planning process, led  by the attending physician.  Recommendations may be updated based on patient status, additional functional criteria and insurance authorization. Assessment: Orofacial Exam: Orofacial Exam Oral Cavity: Oral Hygiene: WFL Oral Cavity - Dentition: Adequate natural dentition Orofacial Anatomy: WFL Oral Motor/Sensory Function: Other (comment) (right anterior labial seal impaired with  bolus consumption) Anatomy: Anatomy: WFL Boluses Administered: Boluses Administered Boluses Administered: Mildly thick liquids (Level 2, nectar thick); Thin liquids (Level 0); Puree; Solid  Oral Impairment Domain: Oral Impairment Domain Lip Closure: Escape from interlabial space or lateral juncture, no extension beyond vermillion border Tongue control during bolus hold: Posterior escape of less than half of bolus Bolus preparation/mastication: Slow prolonged chewing/mashing with complete recollection Bolus transport/lingual motion: Delayed initiation of tongue motion (oral holding) Oral residue: Trace residue lining oral structures Location of oral residue : Tongue Initiation of pharyngeal swallow : Pyriform sinuses (larynx)  Pharyngeal Impairment Domain: Pharyngeal Impairment Domain Soft palate elevation: No bolus between soft palate (SP)/pharyngeal wall (PW) Laryngeal elevation: Partial superior movement of thyroid cartilage/partial approximation of arytenoids to epiglottic petiole Anterior hyoid excursion: Partial anterior movement Epiglottic movement: Complete inversion Laryngeal vestibule closure: Incomplete, narrow column air/contrast in laryngeal vestibule Pharyngeal stripping wave : Present - complete Pharyngeal contraction (A/P view only): N/A Pharyngoesophageal segment opening: -- (difficult to view) Tongue base retraction: No contrast between tongue base and posterior pharyngeal wall (PPW) Pharyngeal residue: Trace residue within or on pharyngeal structures Location of pharyngeal residue: Valleculae  Esophageal Impairment Domain:  Esophageal Impairment Domain Esophageal clearance upright position: Complete clearance, esophageal coating Pill: Pill Consistency administered: Puree Puree: WFL Penetration/Aspiration Scale Score: Penetration/Aspiration Scale Score 1.  Material does not enter airway: Puree; Solid; Pill 7.  Material enters airway, passes BELOW cords and not ejected out despite cough attempt by patient: Mildly thick liquids (Level 2, nectar thick); Thin liquids (Level 0) Compensatory Strategies: Compensatory Strategies Compensatory strategies: Yes Chin tuck: Effective (when performed adequately) Effective Chin Tuck: Thin liquid (Level 0); Mildly thick liquid (Level 2, nectar thick)   General Information: No data recorded Diet Prior to this Study: Regular; Thin liquids (Level 0)   Temperature : Normal   Respiratory Status: WFL   Supplemental O2: Nasal cannula   History of Recent Intubation: No  Behavior/Cognition: Alert; Cooperative; Distractible; Other (Comment) (sleepy) Self-Feeding Abilities: Able to self-feed Baseline vocal quality/speech: Normal Volitional Cough: Able to elicit Volitional Swallow: Able to elicit Exam Limitations: Limited visibility Goal Planning: Prognosis for improved oropharyngeal function: Fair Barriers to Reach Goals: Other (Comment) (advanced  age) No data recorded Patient/Family Stated Goal: to get better Consulted and agree with results and recommendations: Patient; Nurse Pain: Pain Assessment Pain Assessment: No/denies pain Faces Pain Scale: 2 Pain Location: leg Pain Descriptors / Indicators: Aching Pain Intervention(s): Limited activity within patient's tolerance End of Session: Start Time:SLP Start Time (ACUTE ONLY): 1400 Stop Time: SLP Stop Time (ACUTE ONLY): 1425 Time Calculation:SLP Time Calculation (min) (ACUTE ONLY): 25 min Charges: SLP Evaluations $ SLP Speech Visit: 1 Visit SLP Evaluations $BSS Swallow: 1 Procedure $MBS Swallow: 1 Procedure $Swallowing Treatment: 1 Procedure SLP visit diagnosis:  SLP Visit Diagnosis: Dysphagia, oropharyngeal phase (R13.12) Past Medical History: Past Medical History: Diagnosis Date  Acquired hypothyroidism   Chronic diastolic heart failure (HCC)   COPD (chronic obstructive pulmonary disease) (HCC)   Essential hypertension   Hyperlipidemia   Urinary incontinence  Past Surgical History: No past surgical history on file. Chales Abrahams 01/17/2023, 3:55 PM Rolena Infante, MS [facility] SLP Acute Rehab Services Office [phone]   DG Ankle Complete Left  Result Date: 01/17/2023 CLINICAL DATA:  Close left ankle fracture EXAM: LEFT ANKLE COMPLETE - 3 VIEW COMPARISON:  01/09/2023 x-ray FINDINGS: Overlapping cast in place obscures underlying bone detail. There is oblique displaced distal fibular metaphyseal fracture. Fracture line still present. Level of displacement is increased from previous slightly. Separate fracture of the medial malleolus is near anatomic. Preserved ankle mortise. Imaging was obtained to aid in treatment. IMPRESSION: Fracture still present along the distal fibula and medial malleolus. Cast in place. The level of the displacement of the distal fibular fracture appears increased from the prior x-ray slightly. Electronically Signed   By: Karen Kays M.D.   On: 01/17/2023 12:31        Scheduled Meds:  azithromycin  500 mg Oral Daily   cefdinir  300 mg Oral Q12H   cyanocobalamin  500 mcg Oral Daily   enalapril  20 mg Oral BID   enoxaparin (LOVENOX) injection  55 mg Subcutaneous Q24H   fluticasone furoate-vilanterol  1 puff Inhalation Daily   ipratropium-albuterol  3 mL Nebulization BID   levothyroxine  175 mcg Oral Q0600   pantoprazole  40 mg Oral BID AC   senna  1 tablet Oral BID   Continuous Infusions:     LOS: 2 days    Time spent: 40 minutes    Ramiro Harvest, MD [facility]   To contact the attending provider between 7A-7P or the covering provider during after hours 7P-7A, please log into the web site [email or web address] and  access using universal [place] password for that web site. If you do not have the password, please call the hospital operator.  01/18/2023, 12:01 PM

## 2023-01-18 NOTE — [place] FL2 (Addendum)
[place] MEDICAID FL2 LEVEL OF CARE FORM     IDENTIFICATION  Patient Name: Valerie Bowman Birthdate: 1935-04-18 Sex: female Admission Date (Current Location): 01/15/2023  [facility] and [place] Number:  Producer, television/film/video and Address:  [facility],  [address] [place], [place] [address]      Provider Number: [id number]  Attending Physician Name and Address:  Rodolph Bong, MD  Relative Name and Phone Number:  Buel Ream (daughter) Ph: [phone]    Current Level of Care: Hospital Recommended Level of Care: Skilled Nursing Facility Prior Approval Number:    Date Approved/Denied:   PASRR Number: [id number]  Discharge Plan: SNF    Current Diagnoses: Patient Active Problem List   Diagnosis Date Noted   Acute metabolic encephalopathy 01/16/2023   AMS (altered mental status) 01/16/2023   Abnormal chest x-ray with multiple lung nodules 01/16/2023   Altered mental status 01/15/2023   Chronic kidney disease, stage 3a (HCC) 01/15/2023   Closed left ankle fracture 01/10/2023   Chronic cough 01/15/2022   OSA (obstructive sleep apnea) 12/19/2021   Chronic respiratory failure with hypoxia and hypercapnia (HCC) 08/23/2021   Pain in right shoulder 03/20/2021   SOB (shortness of breath) 02/28/2021   Acquired hypothyroidism    Essential hypertension    Chronic diastolic heart failure (HCC)    COPD, severe (HCC) 02/27/2021   Low back pain 02/20/2021    Orientation RESPIRATION BLADDER Height & Weight     Self, Time, Place  O2 (2L/min) Incontinent Weight: 255 lb 4.7 oz (115.8 kg) Height:  5\' 6"  (167.6 cm)  BEHAVIORAL SYMPTOMS/MOOD NEUROLOGICAL BOWEL NUTRITION STATUS      Incontinent Diet (nectar thick liquids)  AMBULATORY STATUS COMMUNICATION OF NEEDS Skin   Extensive Assist Verbally Normal                       Personal Care Assistance Level of Assistance  Bathing, Feeding, Dressing Bathing Assistance: Maximum assistance Feeding assistance:  Independent Dressing Assistance: Maximum assistance     Functional Limitations Info  Sight, Hearing, Speech Sight Info: Impaired Hearing Info: Impaired Speech Info: Adequate    SPECIAL CARE FACTORS FREQUENCY  PT (By licensed PT), OT (By licensed OT)     PT Frequency: 5x/wk OT Frequency: 5x/wk            Contractures Contractures Info: Not present    Additional Factors Info  Code Status, Allergies Code Status Info: DNR Allergies Info: Duloxetine Hcl, Oxybutynin, Penicillins, Sulfa Antibiotics           Current Medications (01/20/2023):  This is the current hospital active medication list Current Facility-Administered Medications  Medication Dose Route Frequency Provider Last Rate Last Admin   acetaminophen (TYLENOL) tablet 650 mg  650 mg Oral Q6H PRN Charlsie Quest, MD       Or   acetaminophen (TYLENOL) suppository 650 mg  650 mg Rectal Q6H PRN Darreld Mclean R, MD       albuterol (PROVENTIL) (2.5 MG/3ML) 0.083% nebulizer solution 2.5 mg  2.5 mg Nebulization Q6H PRN Darreld Mclean R, MD   2.5 mg at 01/19/23 2011   azithromycin (ZITHROMAX) tablet 500 mg  500 mg Oral Daily Darreld Mclean R, MD   500 mg at 01/19/23 2219   bisacodyl (DULCOLAX) EC tablet 5 mg  5 mg Oral Daily PRN Charlsie Quest, MD       cefdinir (OMNICEF) capsule 300 mg  300 mg Oral Q12H Rodolph Bong, MD  300 mg at 01/20/23 1191   cyanocobalamin (VITAMIN B12) tablet 500 mcg  500 mcg Oral Daily Darreld Mclean R, MD   500 mcg at 01/20/23 0909   enalapril (VASOTEC) tablet 20 mg  20 mg Oral BID Darreld Mclean R, MD   20 mg at 01/20/23 0909   enoxaparin (LOVENOX) injection 55 mg  55 mg Subcutaneous Q24H Darreld Mclean R, MD   55 mg at 01/20/23 0909   fluticasone furoate-vilanterol (BREO ELLIPTA) 100-25 MCG/ACT 1 puff  1 puff Inhalation Daily Charlsie Quest, MD   1 puff at 01/20/23 0727   ipratropium-albuterol (DUONEB) 0.5-2.5 (3) MG/3ML nebulizer solution 3 mL  3 mL Nebulization BID Rodolph Bong, MD   3  mL at 01/20/23 0725   levothyroxine (SYNTHROID) tablet 175 mcg  175 mcg Oral Q0600 Rodolph Bong, MD   175 mcg at 01/20/23 0646   ondansetron (ZOFRAN) tablet 4 mg  4 mg Oral Q6H PRN Charlsie Quest, MD       Or   ondansetron (ZOFRAN) injection 4 mg  4 mg Intravenous Q6H PRN Charlsie Quest, MD       pantoprazole (PROTONIX) EC tablet 40 mg  40 mg Oral BID AC Darreld Mclean R, MD   40 mg at 01/20/23 4782   senna (SENOKOT) tablet 8.6 mg  1 tablet Oral BID Darreld Mclean R, MD   8.6 mg at 01/20/23 9562   senna-docusate (Senokot-S) tablet 1 tablet  1 tablet Oral QHS PRN Charlsie Quest, MD         Discharge Medications: Please see discharge summary for a list of discharge medications.  Relevant Imaging Results:  Relevant Lab Results:   Additional Information SSN: [id number]  Amada Jupiter, LCSW

## 2023-01-19 DIAGNOSIS — I10 Essential (primary) hypertension: Secondary | ICD-10-CM | POA: Diagnosis not present

## 2023-01-19 DIAGNOSIS — J9611 Chronic respiratory failure with hypoxia: Secondary | ICD-10-CM | POA: Diagnosis not present

## 2023-01-19 DIAGNOSIS — R4182 Altered mental status, unspecified: Secondary | ICD-10-CM | POA: Diagnosis not present

## 2023-01-19 DIAGNOSIS — N1831 Chronic kidney disease, stage 3a: Secondary | ICD-10-CM | POA: Diagnosis not present

## 2023-01-19 LAB — CBC WITH DIFFERENTIAL/PLATELET
Abs Immature Granulocytes: 0.03 10*3/uL (ref 0.00–0.07)
Basophils Absolute: 0 10*3/uL (ref 0.0–0.1)
Basophils Relative: 0 %
Eosinophils Absolute: 0.1 10*3/uL (ref 0.0–0.5)
Eosinophils Relative: 2 %
HCT: 28 % — ABNORMAL LOW (ref 36.0–46.0)
Hemoglobin: 8.5 g/dL — ABNORMAL LOW (ref 12.0–15.0)
Immature Granulocytes: 0 %
Lymphocytes Relative: 8 %
Lymphs Abs: 0.7 10*3/uL (ref 0.7–4.0)
MCH: 29.6 pg (ref 26.0–34.0)
MCHC: 30.4 g/dL (ref 30.0–36.0)
MCV: 97.6 fL (ref 80.0–100.0)
Monocytes Absolute: 0.8 10*3/uL (ref 0.1–1.0)
Monocytes Relative: 10 %
Neutro Abs: 6.6 10*3/uL (ref 1.7–7.7)
Neutrophils Relative %: 80 %
Platelets: 303 10*3/uL (ref 150–400)
RBC: 2.87 MIL/uL — ABNORMAL LOW (ref 3.87–5.11)
RDW: 15.6 % — ABNORMAL HIGH (ref 11.5–15.5)
WBC: 8.3 10*3/uL (ref 4.0–10.5)
nRBC: 0 % (ref 0.0–0.2)

## 2023-01-19 LAB — BASIC METABOLIC PANEL
Anion gap: 8 (ref 5–15)
BUN: 11 mg/dL (ref 8–23)
CO2: 36 mmol/L — ABNORMAL HIGH (ref 22–32)
Calcium: 8.8 mg/dL — ABNORMAL LOW (ref 8.9–10.3)
Chloride: 97 mmol/L — ABNORMAL LOW (ref 98–111)
Creatinine, Ser: 0.83 mg/dL (ref 0.44–1.00)
GFR, Estimated: >60 mL/min (ref >60)
Glucose, Bld: 119 mg/dL — ABNORMAL HIGH (ref 70–99)
Potassium: 3.8 mmol/L (ref 3.5–5.1)
Sodium: 141 mmol/L (ref 135–145)

## 2023-01-19 MED ORDER — FUROSEMIDE 10 MG/ML IJ SOLN
20.0000 mg | Freq: Once | INTRAMUSCULAR | Status: AC
  Administered 2023-01-19: 20 mg via INTRAVENOUS
  Filled 2023-01-19: qty 2

## 2023-01-19 NOTE — Plan of Care (Signed)
  Problem: Respiratory: Goal: Ability to maintain adequate ventilation will improve Outcome: Progressing Goal: Ability to maintain a clear airway will improve Outcome: Progressing   Problem: Health Behavior/Discharge Planning: Goal: Ability to manage health-related needs will improve Outcome: Progressing   Problem: Clinical Measurements: Goal: Will remain free from infection Outcome: Progressing Goal: Respiratory complications will improve Outcome: Progressing Goal: Cardiovascular complication will be avoided Outcome: Progressing   Problem: Activity: Goal: Risk for activity intolerance will decrease Outcome: Progressing   Problem: Nutrition: Goal: Adequate nutrition will be maintained Outcome: Progressing   Problem: Elimination: Goal: Will not experience complications related to bowel motility Outcome: Progressing Goal: Will not experience complications related to urinary retention Outcome: Progressing   Problem: Pain Management: Goal: General experience of comfort will improve Outcome: Progressing   Problem: Safety: Goal: Ability to remain free from injury will improve Outcome: Progressing   Problem: Skin Integrity: Goal: Risk for impaired skin integrity will decrease Outcome: Progressing

## 2023-01-19 NOTE — TOC Progression Note (Signed)
Transition of Care [facility]) - Progression Note    Patient Details  Name: Valerie Bowman MRN: [id number] Date of Birth: September 13, 1935  [facility] of [facility]) CM/SW Contact  Georgie Chard, LCSW Phone Number: 01/19/2023, 9:50 AM  Clinical Narrative:    At this time patient only has one bed offer. CSW has reached out to [employer] [employer] [facility] as well [facility] to review. At this time there has been N/A. CSW has left HIPAA V/M. TOC will continue to follow as patient is not medically stable for DC as of now.    Expected Discharge Plan: Skilled Nursing Facility Barriers to Discharge: No Barriers Identified  Expected Discharge Plan and Services In-house Referral: Clinical Social Work Discharge Planning Services: NA Post Acute Care Choice: Skilled Nursing Facility Living arrangements for the past 2 months: Assisted Living Facility                 DME Arranged: N/A DME Agency: NA                   Social Determinants of Health (SDOH) Interventions SDOH Screenings   Food Insecurity: No Food Insecurity (01/16/2023)  Housing: Low Risk  (01/16/2023)  Transportation Needs: No Transportation Needs (01/16/2023)  Utilities: Not At Risk (01/16/2023)  Financial Resource Strain: Low Risk  (09/10/2020)   [facility]  Physical Activity: Inactive (09/10/2020)   Received from [facility]  Social Connections: Moderately Isolated (09/10/2020)   Received from [facility]  Stress: No Stress Concern Present (09/10/2020)   Received from [facility]  Tobacco Use: Medium Risk (01/15/2023)    Readmission Risk Interventions     No data to display

## 2023-01-19 NOTE — Progress Notes (Signed)
PROGRESS NOTE    Valerie Bowman  UVO:[id number] DOB: 28-Feb-1936 DOA: 01/15/2023 PCP: Smiley Houseman, NP    Chief Complaint  Patient presents with   Altered Mental Status    Brief Narrative:  Patient is a 87 year old female history of COPD, chronic respiratory failure with hypoxia and hypercapnia on 2 L nasal cannula at baseline, chronic HFpEF, CKD stage IIIa, hypertension, hyperlipidemia, anemia, OSA on trilogy vent nightly presented to the ED with altered mental status after recent discharge hospitalization 01/09/2023-01/14/2023 for close left ankle fracture after mechanical fall.   Assessment & Plan:   Principal Problem:   Altered mental status Active Problems:   Chronic respiratory failure with hypoxia and hypercapnia (HCC)   COPD, severe (HCC)   Acquired hypothyroidism   Essential hypertension   OSA (obstructive sleep apnea)   Closed left ankle fracture   Chronic kidney disease, stage 3a (HCC)   Acute metabolic encephalopathy   AMS (altered mental status)   Abnormal chest x-ray with multiple lung nodules  #1 altered mental status -Likely multifactorial secondary to potential pulmonary infection, narcotic pain medications, possible nonadherence to trilogy vent overnight. -Patient noted to have reported some hallucinations which has since improved.. -Patient more alert today oriented to self place and time.  Patient knows who the president is currently and who the incoming president is going to be. -Urinalysis nitrite negative, leukocytes negative. -Urine strep pneumococcus antigen is negative. -CT head negative for any acute abnormalities. -CT angiogram chest negative for PE, moderate bilateral bronchial wall thickening can be seen in setting of bronchitis, solid pulmonary nodules in the right upper lobe and right middle lobe possibly infectious follow-up CT chest recommended 3 months to ensure resolution, mildly enlarged bilateral hilar lymph nodes likely reactive. -Patient  noted to have been on oxycodone prior to admission which has been discontinued and likely will not be resumed on discharge.   -Continue empiric antibiotics for possible pulmonary infection.   -Patient more alert today, answering questions appropriately.   -Saline lock IV fluids.   -Patient received Lasix 20 mg IV x 1 on 01/18/2023. -Lasix 20 mg IV x 1.   -Continue BiPAP nightly -IV fluids, supportive care.  2.  Right upper lobe and right middle lobe nodule/bibasilar atelectasis -CT angiogram chest with right upper lobe and right middle lobe pulmonary nodules possibly infectious as well as changes of bronchitis noted. -Urine strep pneumococcus antigen negative. -Urine Legionella antigen negative. -Respiratory viral panel negative. -SARS coronavirus 2 PCR negative. -Was on empiric IV Rocephin/azithromycin. -Transitioned to [facility] and continue azithromycin to complete a 5-day course of antibiotic treatment. -Continue DuoNebs. -Patient received Lasix 20 mg IV x 1 on 01/18/2023. -Lasix 20 mg IV x 1 today. -Supportive care.  3.  Chronic hypoxic and hypercapnic respiratory failure/COPD -Patient at baseline on 2 L home O2. -Patient with improvement with wheezing after being started on scheduled DuoNebs.   -Continue scheduled DuoNebs, PPI, Breo Ellipta.   -BiPAP nightly.   -Saline lock IV fluids. -Supportive care.  4.  OSA -Patient noted to be on trilogy vent nightly. -BiPAP nightly while in-house.  5.  Closed left ankle fracture -Patient recently admitted for closed left ankle fracture and noted to have been assessed by orthopedics who recommended conservative management at that time. -Patient currently in short leg splint. -Orthopedics recommended NWB of left lower extremity for 4 to 6 weeks with follow-up in the office midweek next week. -DC oxycodone as felt may be contributing to patient's altered mental status. -PT/OT. -Patient underwent  plain films of the left ankle on 01/17/2023  which showed fracture still present along the distal fibula and medial malleolus.   -Cast in place.  Level of displacement of distal fibular fracture appears increased from prior x-rays slightly. -Patient assessed by orthopedics, Dr. Magnus Ivan who reviewed the films and recommended NWB for up to 6 weeks on the left ankle, close outpatient follow-up in 2 weeks postdischarge to remove the cast and to likely placed 1 more cast on the ankle. -Due to presentation with lethargy orthopedics recommending Tylenol as needed for pain and Ultram as needed for severe pain.  6.  CKD stage IIIa -Stable. -Saline lock IV fluids.  7.  Hypertension -Continue enalapril.  8.  Hypothyroidism -Synthroid.   9.  Normocytic anemia -Hemoglobin was initially slightly trending down to 7.7, felt likely dilutional effect.  Patient received a dose of IV Lasix yesterday, 01/18/2023.   -Hemoglobin currently stable at 8.5.  -No overt bleeding. -Follow.   DVT prophylaxis: Lovenox Code Status: DNR Family Communication: Updated patient.  No family at bedside. Disposition: Likely SNF  Status is: Inpatient The patient will require care spanning > 2 midnights and should be moved to inpatient because: Severity of illness   Consultants:  None  Procedures:  CT angiogram chest 01/15/2023 CT head 01/15/2023 Chest x-ray 01/15/2023 Lower extremity Dopplers 01/15/2023  Antimicrobials:  Anti-infectives (From admission, onward)    Start     Dose/Rate Route Frequency Ordered Stop   01/18/23 2000  cefdinir (OMNICEF) capsule 300 mg        300 mg Oral Every 12 hours 01/18/23 0841 01/21/23 2159   01/16/23 2200  azithromycin (ZITHROMAX) tablet 500 mg        500 mg Oral Daily 01/15/23 2144 01/21/23 2159   01/16/23 2000  cefTRIAXone (ROCEPHIN) 2 g in sodium chloride 0.9 % 100 mL IVPB  Status:  Discontinued        2 g 200 mL/hr over 30 Minutes Intravenous Every 24 hours 01/15/23 2144 01/18/23 0841   01/15/23 2000  cefTRIAXone  (ROCEPHIN) 1 g in sodium chloride 0.9 % 100 mL IVPB        1 g 200 mL/hr over 30 Minutes Intravenous  Once 01/15/23 1950 01/15/23 2114   01/15/23 2000  azithromycin (ZITHROMAX) 500 mg in dextrose 5 % 250 mL IVPB        500 mg 250 mL/hr over 60 Minutes Intravenous  Once 01/15/23 1950 01/15/23 2223         Subjective: Sleeping but easily arousable.  Overall states she is feeling better today.  Denies any chest pain or worsening shortness of breath.  Feels wheezing has improved.  Denies any hallucinations today.   Objective: Vitals:   01/19/23 0638 01/19/23 0639 01/19/23 1000 01/19/23 1154  BP: 110/62 110/62 (!) 103/52 (!) 116/52  Pulse: 61 91 93 65  Resp: 18 18    Temp: 98.7 F (37.1 C) 98.7 F (37.1 C)    TempSrc: Oral Oral    SpO2: 92% 96%  94%  Weight:      Height:        Intake/Output Summary (Last 24 hours) at 01/19/2023 1311 Last data filed at 01/18/2023 1750 Gross per 24 hour  Intake 354 ml  Output --  Net 354 ml   Filed Weights   01/17/23 0519 01/18/23 0500 01/19/23 0500  Weight: 115.1 kg 116.6 kg 116.6 kg    Examination:  General exam: NAD. Respiratory system: Scattered coarse breath sounds.  Minimal expiratory  wheezing.  Fair air movement.  Speaking in full sentences. Cardiovascular system: RRR no murmurs rubs or gallops.  No JVD.  No pitting lower extremity edema.   Gastrointestinal system: Abdomen is soft, nontender, nondistended, positive bowel sounds.  No rebound.  No guarding.    Central nervous system: Alert and oriented to self place and time.  Moving extremities spontaneously.. No focal neurological deficits. Extremities: Left lower extremity in short cast. Skin: No rashes, lesions or ulcers Psychiatry: Judgement and insight appear fair. Mood & affect appropriate.     Data Reviewed: I have personally reviewed following labs and imaging studies  CBC: Recent Labs  Lab 01/15/23 1307 01/16/23 1053 01/17/23 0610 01/18/23 0700 01/19/23 0545   WBC 9.6 11.2* 8.0 8.5 8.3  NEUTROABS 7.5  --  6.3  --  6.6  HGB 8.4* 8.7* 7.9* 7.7* 8.5*  HCT 27.1* 28.0* 25.8* 25.0* 28.0*  MCV 96.1 95.6 97.0 96.9 97.6  PLT 218 271 230 282 303    Basic Metabolic Panel: Recent Labs  Lab 01/15/23 1307 01/16/23 1053 01/17/23 0610 01/18/23 0700 01/19/23 0545  NA 138 139 140 143 141  K 4.1 3.4* 3.9 3.8 3.8  CL 97* 96* 99 100 97*  CO2 32 32 33* 33* 36*  GLUCOSE 104* 171* 118* 121* 119*  BUN 17 15 13 11 11   CREATININE 0.99 0.72 0.77 0.64 0.83  CALCIUM 8.9 8.9 8.5* 8.6* 8.8*  MG  --   --  2.1  --   --     GFR: Estimated Creatinine Clearance: 64.3 mL/min (by C-G formula based on SCr of 0.83 mg/dL).  Liver Function Tests: Recent Labs  Lab 01/15/23 1307  AST 18  ALT 14  ALKPHOS 78  BILITOT 0.9  PROT 6.5  ALBUMIN 2.6*    CBG: Recent Labs  Lab 01/16/23 1600  GLUCAP 105*     Recent Results (from the past 240 hour(s))  Resp panel by RT-PCR (RSV, Flu A&B, Covid) Anterior Nasal Swab     Status: None   Collection Time: 01/09/23 11:20 PM   Specimen: Anterior Nasal Swab  Result Value Ref Range Status   SARS Coronavirus 2 by RT PCR NEGATIVE NEGATIVE Final    Comment: (NOTE) SARS-CoV-2 target nucleic acids are NOT DETECTED.  The SARS-CoV-2 RNA is generally detectable in upper respiratory specimens during the acute phase of infection. The lowest concentration of SARS-CoV-2 viral copies this assay can detect is 138 copies/mL. A negative result does not preclude SARS-Cov-2 infection and should not be used as the sole basis for treatment or other patient management decisions. A negative result may occur with  improper specimen collection/handling, submission of specimen other than nasopharyngeal swab, presence of viral mutation(s) within the areas targeted by this assay, and inadequate number of viral copies(<138 copies/mL). A negative result must be combined with clinical observations, patient history, and  epidemiological information. The expected result is Negative.  Fact Sheet for Patients:  BloggerCourse.com  Fact Sheet for Healthcare Providers:  SeriousBroker.it  This test is no t yet approved or cleared by the [place] FDA and  has been authorized for detection and/or diagnosis of SARS-CoV-2 by FDA under an Emergency Use Authorization (EUA). This EUA will remain  in effect (meaning this test can be used) for the duration of the COVID-19 declaration under Section 564(b)(1) of the Act, 21 U.S.C.section 360bbb-3(b)(1), unless the authorization is terminated  or revoked sooner.       Influenza A by PCR NEGATIVE NEGATIVE Final  Influenza B by PCR NEGATIVE NEGATIVE Final    Comment: (NOTE) The Xpert Xpress SARS-CoV-2/FLU/RSV plus assay is intended as an aid in the diagnosis of influenza from Nasopharyngeal swab specimens and should not be used as a sole basis for treatment. Nasal washings and aspirates are unacceptable for Xpert Xpress SARS-CoV-2/FLU/RSV testing.  Fact Sheet for Patients: BloggerCourse.com  Fact Sheet for Healthcare Providers: SeriousBroker.it  This test is not yet approved or cleared by the [place] FDA and has been authorized for detection and/or diagnosis of SARS-CoV-2 by FDA under an Emergency Use Authorization (EUA). This EUA will remain in effect (meaning this test can be used) for the duration of the COVID-19 declaration under Section 564(b)(1) of the Act, 21 U.S.C. section 360bbb-3(b)(1), unless the authorization is terminated or revoked.     Resp Syncytial Virus by PCR NEGATIVE NEGATIVE Final    Comment: (NOTE) Fact Sheet for Patients: BloggerCourse.com  Fact Sheet for Healthcare Providers: SeriousBroker.it  This test is not yet approved or cleared by the [place] FDA and has been  authorized for detection and/or diagnosis of SARS-CoV-2 by FDA under an Emergency Use Authorization (EUA). This EUA will remain in effect (meaning this test can be used) for the duration of the COVID-19 declaration under Section 564(b)(1) of the Act, 21 U.S.C. section 360bbb-3(b)(1), unless the authorization is terminated or revoked.  Performed at [facility], [address], [place], [place] [address]   Respiratory (~20 pathogens) panel by PCR     Status: None   Collection Time: 01/16/23 12:02 PM   Specimen: Nasopharyngeal Swab; Respiratory  Result Value Ref Range Status   Adenovirus NOT DETECTED NOT DETECTED Final   Coronavirus 229E NOT DETECTED NOT DETECTED Final    Comment: (NOTE) The Coronavirus on the Respiratory Panel, DOES NOT test for the novel  Coronavirus (2019 nCoV)    Coronavirus HKU1 NOT DETECTED NOT DETECTED Final   Coronavirus NL63 NOT DETECTED NOT DETECTED Final   Coronavirus OC43 NOT DETECTED NOT DETECTED Final   Metapneumovirus NOT DETECTED NOT DETECTED Final   Rhinovirus / Enterovirus NOT DETECTED NOT DETECTED Final   Influenza A NOT DETECTED NOT DETECTED Final   Influenza B NOT DETECTED NOT DETECTED Final   Parainfluenza Virus 1 NOT DETECTED NOT DETECTED Final   Parainfluenza Virus 2 NOT DETECTED NOT DETECTED Final   Parainfluenza Virus 3 NOT DETECTED NOT DETECTED Final   Parainfluenza Virus 4 NOT DETECTED NOT DETECTED Final   Respiratory Syncytial Virus NOT DETECTED NOT DETECTED Final   Bordetella pertussis NOT DETECTED NOT DETECTED Final   Bordetella Parapertussis NOT DETECTED NOT DETECTED Final   Chlamydophila pneumoniae NOT DETECTED NOT DETECTED Final   Mycoplasma pneumoniae NOT DETECTED NOT DETECTED Final    Comment: Performed at [facility], [address] [place], [place] [address]  SARS Coronavirus 2 by RT PCR (hospital order, performed in [facility] hospital lab) *cepheid single result test*     Status: None   Collection  Time: 01/16/23 12:02 PM  Result Value Ref Range Status   SARS Coronavirus 2 by RT PCR NEGATIVE NEGATIVE Final    Comment: (NOTE) SARS-CoV-2 target nucleic acids are NOT DETECTED.  The SARS-CoV-2 RNA is generally detectable in upper and lower respiratory specimens during the acute phase of infection. The lowest concentration of SARS-CoV-2 viral copies this assay can detect is 250 copies / mL. A negative result does not preclude SARS-CoV-2 infection and should not be used as the sole basis for  treatment or other patient management decisions.  A negative result may occur with improper specimen collection / handling, submission of specimen other than nasopharyngeal swab, presence of viral mutation(s) within the areas targeted by this assay, and inadequate number of viral copies (<250 copies / mL). A negative result must be combined with clinical observations, patient history, and epidemiological information.  Fact Sheet for Patients:   RoadLapTop.co.za  Fact Sheet for Healthcare Providers: http://kim-miller.com/  This test is not yet approved or  cleared by the [place] FDA and has been authorized for detection and/or diagnosis of SARS-CoV-2 by FDA under an Emergency Use Authorization (EUA).  This EUA will remain in effect (meaning this test can be used) for the duration of the COVID-19 declaration under Section 564(b)(1) of the Act, 21 U.S.C. section 360bbb-3(b)(1), unless the authorization is terminated or revoked sooner.  Performed at [facility], [address] [place], [place] [address]          Radiology Studies: DG Swallowing Func-Speech Pathology  Result Date: 01/17/2023 Table formatting from the original result was not included. Modified Barium Swallow Study Patient Details Name: Sherye Janis MRN: [id number] Date of Birth: November 24, 1935 Today's Date: 01/17/2023 HPI/PMH: HPI: Doran Dec is a 87 y.o. female  with medical history significant for COPD, chronic respiratory failure with hypoxia and hypercapnia on 2 L O2 via [place] at baseline, chronic HFpEF, CKD stage IIIa, HTN, HLD, anemia, OSA on trilogy vent nightly who presented to the ED from SNF for evaluation of altered mental status. .10/31-11/07/2022 was here.   PT/OT recommended SNF and patient was discharged to SNF.  Patient uses trilogy vent at night at baseline. She was not tolerating throughout the night and kept taking it off.She had a similar episode in September when she had the COVID-19 viral infection which eventually resolved on its own.Patient reports a chronic cough productive of white sputum which has not changed from baseline.  She denies chest pain, dyspnea, abdominal pain, dysuria, diarrhea.  Pt has undergone BSE yesterday - and RN reports pt with overtly coughing and ongoing concern for aspiration.  Thus SLP spoke to MD and he agreed to order MBS.  Pt had prior MBS and was coughing without asp observed.  Transient penetration previously noted. Clinical Impression: Clinical Impression: Patient presents with mild oropharynbeal dysphagia -  This represents a diminished ability and swallowing compared to prior MBS. Her oral dysphagia dysphagia is due to impaired coordination/control- allowing spillage of liquids into pharynx-- trachea poorly controlled.  Audible aspiration of thin liquid and trace silent aspiration of thin and large bolus of nectar occured.  Pharyngeal swallow dysfunction chacterized by impaired timing of swallow, inadequacy of laryngeal vestibule closure and elevation.  Postures including chin tuck and separately head of the bed reclined to approximately 45 degrees prevents patient from aspirating liquids.  Patient was hesitant to conduct either strategy stating she looks like a woodpecker and/or she feels uncomfortable swallowing in this position.  Small single sips of nectar thick liquid was tolerated without aspiration.  Though  aspiration was not gross consistent aspiration over time likely can contribute to potential pulmonary infections.  As patient is currently in the hospital recommend nectar thick liquids during meals and implement [employer] protocol.  Medicine with pure whole advised. Patient will benefit from follow-up SLP to address dysphagia and train compensation strategies for maximal airway protection.  Using teach back with recorded video patient educated to recommendations.  Of note cough was correlated to aspiration during today's  evaluation-again different than prior findings. Factors that may increase risk of adverse event in presence of aspiration Rubye Oaks & Clearance Coots 2021): Factors that may increase risk of adverse event in presence of aspiration Rubye Oaks & Clearance Coots 2021): Frail or deconditioned; Respiratory or GI disease Recommendations/Plan: Swallowing Evaluation Recommendations Swallowing Evaluation Recommendations Recommendations: PO diet PO Diet Recommendation: Regular; Mildly thick liquids (Level 2, nectar thick) Liquid Administration via: Cup; Straw Medication Administration: Whole meds with puree Supervision: Intermittent supervision/cueing for swallowing strategies Swallowing strategies  : Small bites/sips; Slow rate Postural changes: Stay upright 30-60 min after meals Oral care recommendations: Oral care BID (2x/day) Treatment Plan Treatment Plan Treatment recommendations: Therapy as outlined in treatment plan below Follow-up recommendations: Skilled nursing-short term rehab (<3 hours/day) Functional status assessment: Patient has had a recent decline in their functional status and demonstrates the ability to make significant improvements in function in a reasonable and predictable amount of time. Treatment frequency: Min 1x/week Treatment duration: 1 week Interventions: Aspiration precaution training; Compensatory techniques; Patient/family education; Oropharyngeal exercises Recommendations Recommendations for  follow up therapy are one component of a multi-disciplinary discharge planning process, led by the attending physician.  Recommendations may be updated based on patient status, additional functional criteria and insurance authorization. Assessment: Orofacial Exam: Orofacial Exam Oral Cavity: Oral Hygiene: WFL Oral Cavity - Dentition: Adequate natural dentition Orofacial Anatomy: WFL Oral Motor/Sensory Function: Other (comment) (right anterior labial seal impaired with  bolus consumption) Anatomy: Anatomy: WFL Boluses Administered: Boluses Administered Boluses Administered: Mildly thick liquids (Level 2, nectar thick); Thin liquids (Level 0); Puree; Solid  Oral Impairment Domain: Oral Impairment Domain Lip Closure: Escape from interlabial space or lateral juncture, no extension beyond vermillion border Tongue control during bolus hold: Posterior escape of less than half of bolus Bolus preparation/mastication: Slow prolonged chewing/mashing with complete recollection Bolus transport/lingual motion: Delayed initiation of tongue motion (oral holding) Oral residue: Trace residue lining oral structures Location of oral residue : Tongue Initiation of pharyngeal swallow : Pyriform sinuses (larynx)  Pharyngeal Impairment Domain: Pharyngeal Impairment Domain Soft palate elevation: No bolus between soft palate (SP)/pharyngeal wall (PW) Laryngeal elevation: Partial superior movement of thyroid cartilage/partial approximation of arytenoids to epiglottic petiole Anterior hyoid excursion: Partial anterior movement Epiglottic movement: Complete inversion Laryngeal vestibule closure: Incomplete, narrow column air/contrast in laryngeal vestibule Pharyngeal stripping wave : Present - complete Pharyngeal contraction (A/P view only): N/A Pharyngoesophageal segment opening: -- (difficult to view) Tongue base retraction: No contrast between tongue base and posterior pharyngeal wall (PPW) Pharyngeal residue: Trace residue within or on  pharyngeal structures Location of pharyngeal residue: Valleculae  Esophageal Impairment Domain: Esophageal Impairment Domain Esophageal clearance upright position: Complete clearance, esophageal coating Pill: Pill Consistency administered: Puree Puree: WFL Penetration/Aspiration Scale Score: Penetration/Aspiration Scale Score 1.  Material does not enter airway: Puree; Solid; Pill 7.  Material enters airway, passes BELOW cords and not ejected out despite cough attempt by patient: Mildly thick liquids (Level 2, nectar thick); Thin liquids (Level 0) Compensatory Strategies: Compensatory Strategies Compensatory strategies: Yes Chin tuck: Effective (when performed adequately) Effective Chin Tuck: Thin liquid (Level 0); Mildly thick liquid (Level 2, nectar thick)   General Information: No data recorded Diet Prior to this Study: Regular; Thin liquids (Level 0)   Temperature : Normal   Respiratory Status: WFL   Supplemental O2: Nasal cannula   History of Recent Intubation: No  Behavior/Cognition: Alert; Cooperative; Distractible; Other (Comment) (sleepy) Self-Feeding Abilities: Able to self-feed Baseline vocal quality/speech: Normal Volitional Cough: Able to elicit Volitional Swallow: Able to elicit  Exam Limitations: Limited visibility Goal Planning: Prognosis for improved oropharyngeal function: Fair Barriers to Reach Goals: Other (Comment) (advanced age) No data recorded Patient/Family Stated Goal: to get better Consulted and agree with results and recommendations: Patient; Nurse Pain: Pain Assessment Pain Assessment: No/denies pain Faces Pain Scale: 2 Pain Location: leg Pain Descriptors / Indicators: Aching Pain Intervention(s): Limited activity within patient's tolerance End of Session: Start Time:SLP Start Time (ACUTE ONLY): 1400 Stop Time: SLP Stop Time (ACUTE ONLY): 1425 Time Calculation:SLP Time Calculation (min) (ACUTE ONLY): 25 min Charges: SLP Evaluations $ SLP Speech Visit: 1 Visit SLP Evaluations $BSS Swallow:  1 Procedure $MBS Swallow: 1 Procedure $Swallowing Treatment: 1 Procedure SLP visit diagnosis: SLP Visit Diagnosis: Dysphagia, oropharyngeal phase (R13.12) Past Medical History: Past Medical History: Diagnosis Date  Acquired hypothyroidism   Chronic diastolic heart failure (HCC)   COPD (chronic obstructive pulmonary disease) (HCC)   Essential hypertension   Hyperlipidemia   Urinary incontinence  Past Surgical History: No past surgical history on file. Chales Abrahams 01/17/2023, 3:55 PM Rolena Infante, MS [facility] SLP Acute Rehab Services Office [phone]        Scheduled Meds:  azithromycin  500 mg Oral Daily   cefdinir  300 mg Oral Q12H   cyanocobalamin  500 mcg Oral Daily   enalapril  20 mg Oral BID   enoxaparin (LOVENOX) injection  55 mg Subcutaneous Q24H   fluticasone furoate-vilanterol  1 puff Inhalation Daily   ipratropium-albuterol  3 mL Nebulization BID   levothyroxine  175 mcg Oral Q0600   pantoprazole  40 mg Oral BID AC   senna  1 tablet Oral BID   Continuous Infusions:     LOS: 3 days    Time spent: 35 minutes    Ramiro Harvest, MD [facility]   To contact the attending provider between 7A-7P or the covering provider during after hours 7P-7A, please log into the web site [email or web address] and access using universal [place] password for that web site. If you do not have the password, please call the hospital operator.  01/19/2023, 1:11 PM

## 2023-01-19 NOTE — Plan of Care (Signed)
?  Problem: Clinical Measurements: ?Goal: Ability to maintain a body temperature in the normal range will improve ?Outcome: Progressing ?  ?Problem: Respiratory: ?Goal: Ability to maintain adequate ventilation will improve ?Outcome: Progressing ?Goal: Ability to maintain a clear airway will improve ?Outcome: Progressing ?  ?Problem: Activity: ?Goal: Ability to tolerate increased activity will improve ?Outcome: Not Progressing ?  ?

## 2023-01-20 DIAGNOSIS — N1831 Chronic kidney disease, stage 3a: Secondary | ICD-10-CM | POA: Diagnosis not present

## 2023-01-20 DIAGNOSIS — G9341 Metabolic encephalopathy: Secondary | ICD-10-CM

## 2023-01-20 DIAGNOSIS — J9611 Chronic respiratory failure with hypoxia: Secondary | ICD-10-CM | POA: Diagnosis not present

## 2023-01-20 DIAGNOSIS — G4733 Obstructive sleep apnea (adult) (pediatric): Secondary | ICD-10-CM | POA: Diagnosis not present

## 2023-01-20 DIAGNOSIS — R4182 Altered mental status, unspecified: Secondary | ICD-10-CM | POA: Diagnosis not present

## 2023-01-20 LAB — CBC
HCT: 27.7 % — ABNORMAL LOW (ref 36.0–46.0)
Hemoglobin: 8.2 g/dL — ABNORMAL LOW (ref 12.0–15.0)
MCH: 29.1 pg (ref 26.0–34.0)
MCHC: 29.6 g/dL — ABNORMAL LOW (ref 30.0–36.0)
MCV: 98.2 fL (ref 80.0–100.0)
Platelets: 303 10*3/uL (ref 150–400)
RBC: 2.82 MIL/uL — ABNORMAL LOW (ref 3.87–5.11)
RDW: 15.6 % — ABNORMAL HIGH (ref 11.5–15.5)
WBC: 8.1 10*3/uL (ref 4.0–10.5)
nRBC: 0 % (ref 0.0–0.2)

## 2023-01-20 LAB — BASIC METABOLIC PANEL
Anion gap: 8 (ref 5–15)
BUN: 17 mg/dL (ref 8–23)
CO2: 36 mmol/L — ABNORMAL HIGH (ref 22–32)
Calcium: 8.8 mg/dL — ABNORMAL LOW (ref 8.9–10.3)
Chloride: 94 mmol/L — ABNORMAL LOW (ref 98–111)
Creatinine, Ser: 0.9 mg/dL (ref 0.44–1.00)
GFR, Estimated: >60 mL/min (ref >60)
Glucose, Bld: 108 mg/dL — ABNORMAL HIGH (ref 70–99)
Potassium: 3.8 mmol/L (ref 3.5–5.1)
Sodium: 138 mmol/L (ref 135–145)

## 2023-01-20 NOTE — Progress Notes (Signed)
   01/20/23 2200  BiPAP/CPAP/SIPAP  BiPAP/CPAP/SIPAP Pt Type Adult  BiPAP/CPAP/SIPAP DREAMSTATIOND (Bi-Level)  Mask Type Full face mask  Mask Size Medium  Respiratory Rate 18 breaths/min  IPAP 12 cmH20  EPAP 6 cmH2O  Flow Rate 2 lpm  Patient Home Equipment No  Auto Titrate No (Bi-Level)   Pt. Placed on BiPAP(Bi-Level) via Dreamstation BiPAP with 2 lpm Oxygen added into circuit, tolerating well.

## 2023-01-20 NOTE — Plan of Care (Signed)
°  Problem: Activity: Goal: Ability to tolerate increased activity will improve Outcome: Progressing   Problem: Clinical Measurements: Goal: Ability to maintain a body temperature in the normal range will improve Outcome: Progressing   Problem: Respiratory: Goal: Ability to maintain adequate ventilation will improve Outcome: Progressing Goal: Ability to maintain a clear airway will improve Outcome: Progressing   Problem: Respiratory: Goal: Ability to maintain a clear airway will improve Outcome: Progressing

## 2023-01-20 NOTE — Progress Notes (Addendum)
PROGRESS NOTE    Valerie Bowman  VHQ:[id number] DOB: 01-Oct-1935 DOA: 01/15/2023 PCP: Smiley Houseman, NP    Chief Complaint  Patient presents with   Altered Mental Status    Brief Narrative:  Patient is a 87 year old female history of COPD, chronic respiratory failure with hypoxia and hypercapnia on 2 L nasal cannula at baseline, chronic HFpEF, CKD stage IIIa, hypertension, hyperlipidemia, anemia, OSA on trilogy vent nightly presented to the ED with altered mental status after recent discharge hospitalization 01/09/2023-01/14/2023 for close left ankle fracture after mechanical fall.   Assessment & Plan:   Principal Problem:   Altered mental status Active Problems:   Chronic respiratory failure with hypoxia and hypercapnia (HCC)   COPD, severe (HCC)   Acquired hypothyroidism   Essential hypertension   OSA (obstructive sleep apnea)   Closed left ankle fracture   Chronic kidney disease, stage 3a (HCC)   Acute metabolic encephalopathy   AMS (altered mental status)   Abnormal chest x-ray with multiple lung nodules  #1 altered mental status/acute metabolic encephalopathy -Likely multifactorial secondary to potential pulmonary infection, narcotic pain medications, possible nonadherence to trilogy vent overnight. -Patient noted to have reported some hallucinations which has since improved.. -Patient more alert today oriented to self place and time.  Patient knows who the president is currently and who the incoming president is going to be. -Urinalysis nitrite negative, leukocytes negative. -Urine strep pneumococcus antigen is negative. -CT head negative for any acute abnormalities. -CT angiogram chest negative for PE, moderate bilateral bronchial wall thickening can be seen in setting of bronchitis, solid pulmonary nodules in the right upper lobe and right middle lobe possibly infectious follow-up CT chest recommended 3 months to ensure resolution, mildly enlarged bilateral hilar lymph nodes  likely reactive. -Patient noted to have been on oxycodone prior to admission which has been discontinued and likely will not be resumed on discharge.   -Continue empiric antibiotics for possible pulmonary infection.   -Patient more alert today, answering questions appropriately.   -Saline lock IV fluids.   -Patient received Lasix 20 mg IV x 1 on 01/18/2023 and 01/19/2023. -Continue BiPAP nightly -supportive care.  2.  Right upper lobe and right middle lobe nodule/bibasilar atelectasis/probable pneumonia -CT angiogram chest with right upper lobe and right middle lobe pulmonary nodules possibly infectious as well as changes of bronchitis noted. -Urine strep pneumococcus antigen negative. -Urine Legionella antigen negative. -Respiratory viral panel negative. -SARS coronavirus 2 PCR negative. -Was on empiric IV Rocephin/azithromycin. -Transitioned to [facility] and continued on azithromycin to complete a 5-day course of antibiotic treatment. -Continue DuoNebs. -Patient received Lasix 20 mg IV x 1 on 01/18/2023, 01/19/2023. -Urine output of 1 L over the past 24 hours. -Supportive care.  3.  Chronic hypoxic and hypercapnic respiratory failure/COPD -Patient at baseline on 2 L home O2. -Patient with improvement with wheezing after being started on scheduled DuoNebs.   -Continue scheduled DuoNebs, PPI, Breo Ellipta.   -BiPAP nightly.   -Saline lock IV fluids. -Supportive care.  4.  OSA -Patient noted to be on trilogy vent nightly. -BiPAP nightly while in-house.  5.  Closed left ankle fracture -Patient recently admitted for closed left ankle fracture and noted to have been assessed by orthopedics who recommended conservative management at that time. -Patient currently in short leg splint. -Orthopedics recommended NWB of left lower extremity for 4 to 6 weeks with follow-up in the office midweek next week. -DC oxycodone as felt may be contributing to patient's altered mental  status. -PT/OT. -Patient  underwent plain films of the left ankle on 01/17/2023 which showed fracture still present along the distal fibula and medial malleolus.   -Cast in place.  Level of displacement of distal fibular fracture appears increased from prior x-rays slightly. -Patient assessed by orthopedics, Dr. Magnus Ivan who reviewed the films and recommended NWB for up to 6 weeks on the left ankle, close outpatient follow-up in 2 weeks postdischarge to remove the cast and to likely placed 1 more cast on the ankle. -Due to presentation with lethargy orthopedics recommending Tylenol as needed for pain and Ultram as needed for severe pain.  6.  CKD stage IIIa -Stable. -Saline lock IV fluids.  7.  Hypertension -Enalapril.   8.  Hypothyroidism -Continue Synthroid.     9.  Normocytic anemia -Hemoglobin was initially slightly trending down to 7.7, felt likely dilutional effect.  Patient received a dose of IV Lasix, 01/18/2023 with good urine output..   -Hemoglobin currently stable at 8.2.  -No overt bleeding. -Follow.  10.  Obesity class III -BMI 41.21 kg/m. -Lifestyle modification -Follow-up with PCP   DVT prophylaxis: Lovenox Code Status: DNR Family Communication: Updated patient and daughter at bedside. Disposition: Medically stable as of 01/20/2023.  SNF when bed available.    Status is: Inpatient The patient will require care spanning > 2 midnights and should be moved to inpatient because: Severity of illness   Consultants:  Orthopedics: Dr. Magnus Ivan  Procedures:  CT angiogram chest 01/15/2023 CT head 01/15/2023 Chest x-ray 01/15/2023 Lower extremity Dopplers 01/15/2023  Antimicrobials:  Anti-infectives (From admission, onward)    Start     Dose/Rate Route Frequency Ordered Stop   01/18/23 2000  cefdinir (OMNICEF) capsule 300 mg        300 mg Oral Every 12 hours 01/18/23 0841 01/21/23 2159   01/16/23 2200  azithromycin (ZITHROMAX) tablet 500 mg        500 mg Oral Daily  01/15/23 2144 01/21/23 2159   01/16/23 2000  cefTRIAXone (ROCEPHIN) 2 g in sodium chloride 0.9 % 100 mL IVPB  Status:  Discontinued        2 g 200 mL/hr over 30 Minutes Intravenous Every 24 hours 01/15/23 2144 01/18/23 0841   01/15/23 2000  cefTRIAXone (ROCEPHIN) 1 g in sodium chloride 0.9 % 100 mL IVPB        1 g 200 mL/hr over 30 Minutes Intravenous  Once 01/15/23 1950 01/15/23 2114   01/15/23 2000  azithromycin (ZITHROMAX) 500 mg in dextrose 5 % 250 mL IVPB        500 mg 250 mL/hr over 60 Minutes Intravenous  Once 01/15/23 1950 01/15/23 2223         Subjective: Sleeping but arousable.  Denies any chest pain, no shortness of breath, no abdominal pain.  Denies any visual or auditory hallucinations.  Daughter at bedside.   Objective: Vitals:   01/19/23 1950 01/20/23 0636 01/20/23 0725 01/20/23 1228  BP: 96/60 121/61  (!) 122/55  Pulse: 67 68  71  Resp: 16 16  16   Temp: 98.4 F (36.9 C) 97.8 F (36.6 C)  98.2 F (36.8 C)  TempSrc:      SpO2: 96% (!) 89% 94% 98%  Weight:  115.8 kg    Height:        Intake/Output Summary (Last 24 hours) at 01/20/2023 1808 Last data filed at 01/20/2023 1120 Gross per 24 hour  Intake 120 ml  Output 400 ml  Net -280 ml   Filed Weights   01/18/23  0500 01/19/23 0500 01/20/23 0636  Weight: 116.6 kg 116.6 kg 115.8 kg    Examination:  General exam: NAD. Respiratory system: Decreased scattered coarse breath sounds.  Minimal expiratory wheezing.  Fair air movement.  Speaking in full sentences.  Cardiovascular system: Regular rate rhythm no murmurs rubs or gallops.  No JVD.  No pitting lower extremity edema.  Gastrointestinal system: Abdomen is soft, nontender, nondistended, positive bowel sounds.  No rebound.  No guarding.    Central nervous system: Alert and oriented to self place and time.  Moving extremities spontaneously.. No focal neurological deficits. Extremities: Left lower extremity in short cast. Skin: No rashes, lesions or  ulcers Psychiatry: Judgement and insight appear fair. Mood & affect appropriate.     Data Reviewed: I have personally reviewed following labs and imaging studies  CBC: Recent Labs  Lab 01/15/23 1307 01/16/23 1053 01/17/23 0610 01/18/23 0700 01/19/23 0545 01/20/23 0514  WBC 9.6 11.2* 8.0 8.5 8.3 8.1  NEUTROABS 7.5  --  6.3  --  6.6  --   HGB 8.4* 8.7* 7.9* 7.7* 8.5* 8.2*  HCT 27.1* 28.0* 25.8* 25.0* 28.0* 27.7*  MCV 96.1 95.6 97.0 96.9 97.6 98.2  PLT 218 271 230 282 303 303    Basic Metabolic Panel: Recent Labs  Lab 01/16/23 1053 01/17/23 0610 01/18/23 0700 01/19/23 0545 01/20/23 0514  NA 139 140 143 141 138  K 3.4* 3.9 3.8 3.8 3.8  CL 96* 99 100 97* 94*  CO2 32 33* 33* 36* 36*  GLUCOSE 171* 118* 121* 119* 108*  BUN 15 13 11 11 17   CREATININE 0.72 0.77 0.64 0.83 0.90  CALCIUM 8.9 8.5* 8.6* 8.8* 8.8*  MG  --  2.1  --   --   --     GFR: Estimated Creatinine Clearance: 59.1 mL/min (by C-G formula based on SCr of 0.9 mg/dL).  Liver Function Tests: Recent Labs  Lab 01/15/23 1307  AST 18  ALT 14  ALKPHOS 78  BILITOT 0.9  PROT 6.5  ALBUMIN 2.6*    CBG: Recent Labs  Lab 01/16/23 1600  GLUCAP 105*     Recent Results (from the past 240 hour(s))  Respiratory (~20 pathogens) panel by PCR     Status: None   Collection Time: 01/16/23 12:02 PM   Specimen: Nasopharyngeal Swab; Respiratory  Result Value Ref Range Status   Adenovirus NOT DETECTED NOT DETECTED Final   Coronavirus 229E NOT DETECTED NOT DETECTED Final    Comment: (NOTE) The Coronavirus on the Respiratory Panel, DOES NOT test for the novel  Coronavirus (2019 nCoV)    Coronavirus HKU1 NOT DETECTED NOT DETECTED Final   Coronavirus NL63 NOT DETECTED NOT DETECTED Final   Coronavirus OC43 NOT DETECTED NOT DETECTED Final   Metapneumovirus NOT DETECTED NOT DETECTED Final   Rhinovirus / Enterovirus NOT DETECTED NOT DETECTED Final   Influenza A NOT DETECTED NOT DETECTED Final   Influenza B NOT  DETECTED NOT DETECTED Final   Parainfluenza Virus 1 NOT DETECTED NOT DETECTED Final   Parainfluenza Virus 2 NOT DETECTED NOT DETECTED Final   Parainfluenza Virus 3 NOT DETECTED NOT DETECTED Final   Parainfluenza Virus 4 NOT DETECTED NOT DETECTED Final   Respiratory Syncytial Virus NOT DETECTED NOT DETECTED Final   Bordetella pertussis NOT DETECTED NOT DETECTED Final   Bordetella Parapertussis NOT DETECTED NOT DETECTED Final   Chlamydophila pneumoniae NOT DETECTED NOT DETECTED Final   Mycoplasma pneumoniae NOT DETECTED NOT DETECTED Final    Comment: Performed at [facility]  [facility], [address], [place], [place] [address]  SARS Coronavirus 2 by RT PCR (hospital order, performed in [facility] hospital lab) *cepheid single result test*     Status: None   Collection Time: 01/16/23 12:02 PM  Result Value Ref Range Status   SARS Coronavirus 2 by RT PCR NEGATIVE NEGATIVE Final    Comment: (NOTE) SARS-CoV-2 target nucleic acids are NOT DETECTED.  The SARS-CoV-2 RNA is generally detectable in upper and lower respiratory specimens during the acute phase of infection. The lowest concentration of SARS-CoV-2 viral copies this assay can detect is 250 copies / mL. A negative result does not preclude SARS-CoV-2 infection and should not be used as the sole basis for treatment or other patient management decisions.  A negative result may occur with improper specimen collection / handling, submission of specimen other than nasopharyngeal swab, presence of viral mutation(s) within the areas targeted by this assay, and inadequate number of viral copies (<250 copies / mL). A negative result must be combined with clinical observations, patient history, and epidemiological information.  Fact Sheet for Patients:   RoadLapTop.co.za  Fact Sheet for Healthcare Providers: http://kim-miller.com/  This test is not yet approved or  cleared by the [place] FDA  and has been authorized for detection and/or diagnosis of SARS-CoV-2 by FDA under an Emergency Use Authorization (EUA).  This EUA will remain in effect (meaning this test can be used) for the duration of the COVID-19 declaration under Section 564(b)(1) of the Act, 21 U.S.C. section 360bbb-3(b)(1), unless the authorization is terminated or revoked sooner.  Performed at [facility], [address] [place], [place] [address]          Radiology Studies: No results found.      Scheduled Meds:  azithromycin  500 mg Oral Daily   cefdinir  300 mg Oral Q12H   cyanocobalamin  500 mcg Oral Daily   enalapril  20 mg Oral BID   enoxaparin (LOVENOX) injection  55 mg Subcutaneous Q24H   fluticasone furoate-vilanterol  1 puff Inhalation Daily   ipratropium-albuterol  3 mL Nebulization BID   levothyroxine  175 mcg Oral Q0600   pantoprazole  40 mg Oral BID AC   senna  1 tablet Oral BID   Continuous Infusions:     LOS: 4 days    Time spent: 35 minutes    Ramiro Harvest, MD [facility]   To contact the attending provider between 7A-7P or the covering provider during after hours 7P-7A, please log into the web site [email or web address] and access using universal [place] password for that web site. If you do not have the password, please call the hospital operator.  01/20/2023, 6:08 PM

## 2023-01-20 NOTE — Progress Notes (Signed)
Speech Language Pathology Treatment: Dysphagia  Patient Details Name: Stachia Keirstead MRN: [id number] DOB: March 11, 1936 Today's Date: 01/20/2023 Time: 4098-1191 SLP Time Calculation (min) (ACUTE ONLY): 25 min  Assessment / Plan / Recommendation Clinical Impression  Patient seen by SLP for skilled treatment focused on dysphagia goals. Patient's daughter was present as well. Patient appeared somewhat fatigued but was able to maintain adequate alertness throughout session. Daughter and patient both reported that overall PO intake has been minimal and that patient does not like the thickened liquids.  During discussion, patient admits that at baseline she eats and drinks too quickly and drinks large sips. When SLP educating patient on aspiration risk and current recommendations, she was somewhat dismissive and did not appear to have adequate acceptance of aspiration risks and consequences. SLP trialed a 10cc flow rate restricted cup (Provale) with thin liquids (juice). Patient exhibited a consistent, slightly delayed cough with each sip. SLP then assessed patient's toleration of nectar thick juice from a regular cup. Patient did not exhibit any immediate or delayed coughing with several, non-consecutive sips. SLP discussed with patient and daughter recommendation to continue with nectar thick liquids at this time, continue with water sips in between meals PRN. SLP will continue to follow and recommending f/u at next venue of care.   HPI HPI: Demii Leder is a 87 y.o. female with medical history significant for COPD, chronic respiratory failure with hypoxia and hypercapnia on 2 L O2 via [place] at baseline, chronic HFpEF, CKD stage IIIa, HTN, HLD, anemia, OSA on trilogy vent nightly who presented to the ED from SNF for evaluation of altered mental status. .10/31-11/07/2022 was here.   PT/OT recommended SNF and patient was discharged to SNF.  Patient uses trilogy vent at night at baseline. She was not tolerating throughout  the night and kept taking it off.She had a similar episode in September when she had the COVID-19 viral infection which eventually resolved on its own.Patient reports a chronic cough productive of white sputum which has not changed from baseline.  She denies chest pain, dyspnea, abdominal pain, dysuria, diarrhea.  Pt has undergone BSE yesterday - and RN reports pt with overtly coughing and ongoing concern for aspiration.  Thus SLP spoke to MD and he agreed to order MBS.  Pt had prior MBS and was coughing without asp observed.  Transient penetration previously noted.      SLP Plan  Continue with current plan of care      Recommendations for follow up therapy are one component of a multi-disciplinary discharge planning process, led by the attending physician.  Recommendations may be updated based on patient status, additional functional criteria and insurance authorization.    Recommendations  Diet recommendations: Nectar-thick liquid;Regular Liquids provided via: Cup;Straw Medication Administration: Whole meds with puree Supervision: Patient able to self feed;Intermittent supervision to cue for compensatory strategies Compensations: Slow rate;Small sips/bites Postural Changes and/or Swallow Maneuvers: Seated upright 90 degrees                  Oral care BID   Set up Supervision/Assistance Dysphagia, oropharyngeal phase (R13.12)     Continue with current plan of care    Angela Nevin, MA, CCC-SLP Speech Therapy

## 2023-01-20 NOTE — Progress Notes (Signed)
Cpap(dreamstation) on standby

## 2023-01-20 NOTE — Progress Notes (Signed)
   01/20/23 0003  BiPAP/CPAP/SIPAP  BiPAP/CPAP/SIPAP Pt Type Adult  BiPAP/CPAP/SIPAP DREAMSTATIOND (bileval ventilation)  Mask Type Full face mask  Mask Size Medium  Respiratory Rate 18 breaths/min  IPAP 12 cmH20  EPAP 6 cmH2O  Flow Rate 2 lpm  Patient Home Equipment No  Auto Titrate No (bileval)

## 2023-01-21 DIAGNOSIS — R4182 Altered mental status, unspecified: Secondary | ICD-10-CM | POA: Diagnosis not present

## 2023-01-21 DIAGNOSIS — N1831 Chronic kidney disease, stage 3a: Secondary | ICD-10-CM | POA: Diagnosis not present

## 2023-01-21 DIAGNOSIS — G4733 Obstructive sleep apnea (adult) (pediatric): Secondary | ICD-10-CM | POA: Diagnosis not present

## 2023-01-21 DIAGNOSIS — J9611 Chronic respiratory failure with hypoxia: Secondary | ICD-10-CM | POA: Diagnosis not present

## 2023-01-21 LAB — BASIC METABOLIC PANEL
Anion gap: 7 (ref 5–15)
BUN: 15 mg/dL (ref 8–23)
CO2: 38 mmol/L — ABNORMAL HIGH (ref 22–32)
Calcium: 8.9 mg/dL (ref 8.9–10.3)
Chloride: 98 mmol/L (ref 98–111)
Creatinine, Ser: 0.69 mg/dL (ref 0.44–1.00)
GFR, Estimated: >60 mL/min (ref >60)
Glucose, Bld: 105 mg/dL — ABNORMAL HIGH (ref 70–99)
Potassium: 3.7 mmol/L (ref 3.5–5.1)
Sodium: 143 mmol/L (ref 135–145)

## 2023-01-21 MED ORDER — ENOXAPARIN SODIUM 60 MG/0.6ML IJ SOSY: 55.0000 mg | PREFILLED_SYRINGE | INTRAMUSCULAR | Status: DC

## 2023-01-21 MED ORDER — ACETAMINOPHEN 325 MG PO TABS: 650.0000 mg | ORAL_TABLET | Freq: Four times a day (QID) | ORAL | Status: DC | PRN

## 2023-01-21 MED ORDER — TRAMADOL HCL 50 MG PO TABS: 25.0000 mg | ORAL_TABLET | Freq: Two times a day (BID) | ORAL | 0 refills | Status: DC | PRN

## 2023-01-21 MED ORDER — ALBUTEROL SULFATE (2.5 MG/3ML) 0.083% IN NEBU: INHALATION_SOLUTION | RESPIRATORY_TRACT | 0 refills | Status: DC

## 2023-01-21 MED ORDER — GUAIFENESIN ER 600 MG PO TB12: 600.0000 mg | ORAL_TABLET | Freq: Two times a day (BID) | ORAL | 0 refills | Status: AC

## 2023-01-21 NOTE — Discharge Summary (Addendum)
Physician Discharge Summary  Valerie Bowman PPI:[id number] DOB: [birth year] DOA: 01/15/2023  PCP: Smiley Houseman, NP  Admit date: 01/15/2023 Discharge date: 01/21/2023  Time spent: 60 minutes  Recommendations for Outpatient Follow-up:  Follow-up with speech therapy at facility for evaluation of dysphagia and further diet advancement.. Follow-up with MD at SNF.  Patient will need a basic metabolic profile done in 1 week to follow-up on electrolytes and renal function. Follow-up with Dr. Magnus Ivan, orthopedics in 1 week.   Discharge Diagnoses:  Principal Problem:   Altered mental status Active Problems:   Chronic respiratory failure with hypoxia and hypercapnia (HCC)   COPD, severe (HCC)   Acquired hypothyroidism   Essential hypertension   OSA (obstructive sleep apnea)   Closed left ankle fracture   Chronic kidney disease, stage 3a (HCC)   Acute metabolic encephalopathy   AMS (altered mental status)   Abnormal chest x-ray with multiple lung nodules   Discharge Condition: Stable and improved.  Diet recommendation: Regular diet with nectar thick liquids and Frazier water protocol.  Filed Weights   01/19/23 0500 01/20/23 0636 01/21/23 0517  Weight: 116.6 kg 115.8 kg 115.4 kg    History of present illness:  HPI per Dr. Alison Stalling Valerie Bowman is a 87 y.o. female with medical history significant for COPD, chronic respiratory failure with hypoxia and hypercapnia on 2 L O2 via [place] at baseline, chronic HFpEF, CKD stage IIIa, HTN, HLD, anemia, OSA on trilogy vent nightly who presented to the ED from SNF for evaluation of altered mental status.  History is supplemented by patient's daughter at bedside.   Patient recently admitted 01/09/2023-01/14/2023 for closed left ankle fracture occurring after mechanical fall.  She was seen by orthopedics who recommended conservative management.  Short leg cast was placed.  Patient also noted to have AKI with creatinine improved from 1.84-1.16 on discharge.  Lasix  and HCTZ were held.  PT/OT recommended SNF and patient was discharged to SNF yesterday.   Daughter states that patient was given oxycodone last night for pain.  Patient uses trilogy vent at night at baseline.  She was not tolerating throughout the night and kept taking it off.   This morning she was noted to be excessively somnolent.  Daughter went into check on her and patient was minimally arousable.  She was brought to the ED for further evaluation.  Family states that patient has been hallucinating while in the ED, speaking to people who are not present.  She has been lashing out at family.  She had a similar episode in September when she had the COVID-19 viral infection which eventually resolved on its own.   Patient reports a chronic cough productive of white sputum which has not changed from baseline.  She denies chest pain, dyspnea, abdominal pain, dysuria, diarrhea.   ED Course  Labs/Imaging on admission: I have personally reviewed following labs and imaging studies.   Initial vitals showed BP 120/49, pulse 80, RR 20, temp 99.2 F, SpO2 94% on 2 L O2 via [place].   Labs show sodium 138, potassium 4.1, bicarb 32, BUN 17, creatinine 0.99, serum glucose 104, LFTs within normal limits, WBC 9.6, hemoglobin 8.4, platelets 218,000, TSH 0.428, free T4 in process.   VBG pH 7.4, pCO2 56, pO2 41.  D-dimer 1.77.  Urinalysis negative for UTI.   LLE venous ultrasound negative for evidence of DVT.   CTA chest negative for evidence of central pulmonary embolus.  Moderate bilateral bronchial wall thickening seen.  Solid pulmonary nodules of  the right upper and right middle lobe, possibly infectious, largest measures up to 13 mm noted.  Mildly enlarged bilateral hilar lymph nodes which are likely reactive.  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   CT head without contrast negative for acute or cranial pathology.  Mild age-related atrophy and chronic microvascular changes  seen.   Patient was given IV ceftriaxone and azithromycin.  The hospitalist service was consulted to admit for further evaluation and management.   Hospital Course:  #1 altered mental status/acute metabolic encephalopathy -Likely multifactorial secondary to potential pulmonary infection, narcotic pain medications, possible nonadherence to trilogy vent overnight. -Patient noted to have reported some hallucinations which has since improved and had resolved by day of discharge... -Urinalysis nitrite negative, leukocytes negative. -Urine strep pneumococcus antigen is negative. -CT head negative for any acute abnormalities. -CT angiogram chest negative for PE, moderate bilateral bronchial wall thickening can be seen in setting of bronchitis, solid pulmonary nodules in the right upper lobe and right middle lobe possibly infectious follow-up CT chest recommended 3 months to ensure resolution, mildly enlarged bilateral hilar lymph nodes likely reactive. -Patient noted to have been on oxycodone prior to admission which has been discontinued and will not be resumed on discharge.   -Patient completed course of antibiotic treatment during the hospitalization due to concern for probable pulmonary infection.   -Patient improved clinically and was back to baseline by day of discharge.  -Outpatient follow-up.    2.  Right upper lobe and right middle lobe nodule/bibasilar atelectasis/probable pneumonia -CT angiogram chest with right upper lobe and right middle lobe pulmonary nodules possibly infectious as well as changes of bronchitis noted. -Urine strep pneumococcus antigen negative. -Urine Legionella antigen negative. -Respiratory viral panel negative. -SARS coronavirus 2 PCR negative. -Was on empiric IV Rocephin/azithromycin. -Transitioned to [facility] and continued on azithromycin and completed course of antibiotic treatment.  -Patient was maintained on DuoNebs. -Patient received Lasix 20 mg IV x 1 on  01/18/2023, 01/19/2023. -Patient with good urine output.   -Outpatient follow-up.     3.  Chronic hypoxic and hypercapnic respiratory failure/COPD -Patient at baseline on 2 L home O2. -Patient with improvement with wheezing after being started on scheduled DuoNebs during the hospitalization.   -Patient maintained on Breo Ellipta, PPI as well as BiPAP nightly. -Patient was discharged in stable and improved condition. -Outpatient follow-up.   4.  OSA -Patient noted to be on trilogy vent nightly. -BiPAP nightly while in-house. -Patient to resume trilogy vent on discharge.   5.  Closed left ankle fracture -Patient recently admitted for closed left ankle fracture and noted to have been assessed by orthopedics who recommended conservative management at that time. -Patient currently in short leg splint. -Orthopedics recommended NWB of left lower extremity for 4 to 6 weeks with follow-up in the office midweek next week. -Discontinued oxycodone as felt may be contributing to patient's altered mental status. -PT/OT. -Patient underwent plain films of the left ankle on 01/17/2023 which showed fracture still present along the distal fibula and medial malleolus.   -Cast in place.  Level of displacement of distal fibular fracture appears increased from prior x-rays slightly. -Patient assessed by orthopedics, Dr. Magnus Ivan who reviewed the films and recommended NWB for up to 6 weeks on the left ankle, close outpatient follow-up in 2 weeks postdischarge to remove the cast and to likely placed 1 more cast on the ankle. -Due to presentation with lethargy orthopedics recommending Tylenol as needed for pain and  Ultram as needed for severe pain. -Patient will be discharged on Lovenox for DVT prophylaxis.   6.  CKD stage IIIa -Stable.   7.  Hypertension -Patient maintained on home regimen enalapril.    8.  Hypothyroidism -Patient maintained on home regimen Synthroid. .      9.  Normocytic  anemia -Hemoglobin was initially slightly trending down to 7.7, felt likely dilutional effect.  Patient received a dose of IV Lasix, 01/18/2023 with good urine output..   -Hemoglobin stabilized at 8.2 by day of discharge.   -Patient with no overt bleeding.   -Outpatient follow-up.    10.  Obesity class III -BMI 41.21 kg/m. -Lifestyle modification -Follow-up with PCP  Procedures: CT angiogram chest 01/15/2023 CT head 01/15/2023 Chest x-ray 01/15/2023 Lower extremity Dopplers 01/15/2023  Consultations: Orthopedics: Dr. Magnus Ivan   Discharge Exam: Vitals:   01/21/23 1005 01/21/23 1107  BP: (!) 125/49   Pulse:    Resp:    Temp:    SpO2:  94%    General: NAD Cardiovascular: RRR no murmurs rubs or gallops.  No JVD.  No lower extremity edema. Respiratory: Some scattered coarse breath sounds otherwise clear.  No crackles.  Speaking in full sentences.  Discharge Instructions   Discharge Instructions     Diet - low sodium heart healthy   Complete by: As directed    Heart healthy diet with nectar thick liquids.  Frazier water protocol.   Increase activity slowly   Complete by: As directed    NWB to LLE      Allergies as of 01/21/2023       Reactions   Duloxetine Hcl Other (See Comments)   Oxybutynin Other (See Comments)   Oxycodone Other (See Comments)   AMS, lethargy   Penicillins Other (See Comments)   Listed on MAR as allergy Unknown reaction   Sulfa Antibiotics Other (See Comments)   Listed on MAR as allergy Unknown reaction        Medication List     STOP taking these medications    oxyCODONE 5 MG immediate release tablet Commonly known as: Oxy IR/ROXICODONE       TAKE these medications    acetaminophen 325 MG tablet Commonly known as: TYLENOL Take 2 tablets (650 mg total) by mouth every 6 (six) hours as needed for mild pain (pain score 1-3) (or Fever >/= 101).   albuterol 108 (90 Base) MCG/ACT inhaler Commonly known as: VENTOLIN HFA Inhale 2  puffs into the lungs every 6 (six) hours as needed for wheezing or shortness of breath. What changed: Another medication with the same name was changed. Make sure you understand how and when to take each.   albuterol (2.5 MG/3ML) 0.083% nebulizer solution Commonly known as: PROVENTIL Take 3 mLs (2.5 mg total) by nebulization 2 (two) times daily for 3 days, THEN 3 mLs (2.5 mg total) every 4 (four) hours as needed. Start taking on: January 21, 2023 What changed: See the new instructions.   ALIVE WOMENS 50+ GUMMY PO Take 1 tablet by mouth in the morning.   Artificial Tears 0.2-0.2-1 % Soln Generic drug: Glycerin-Hypromellose-PEG 400 Place 1 drop into both eyes 2 (two) times daily.   aspirin EC 81 MG tablet Take 81 mg by mouth in the morning.   benzonatate 200 MG capsule Commonly known as: TESSALON Take 1 capsule (200 mg total) by mouth 3 (three) times daily as needed for cough.   cetirizine 5 MG tablet Commonly known as: ZYRTEC Take 5 mg  by mouth every evening.   cyanocobalamin 500 MCG tablet Commonly known as: VITAMIN B12 Take 1 tablet (500 mcg total) by mouth daily. What changed: when to take this   Delsym 30 MG/5ML liquid Generic drug: dextromethorphan Take 10 mLs by mouth every 12 (twelve) hours as needed for cough.   diclofenac Sodium 1 % Gel Commonly known as: VOLTAREN Apply 1 Application topically every 6 (six) hours as needed (pain).   enalapril 20 MG tablet Commonly known as: VASOTEC Take 20 mg by mouth 2 (two) times daily. Hold for SBP < 110   enoxaparin 60 MG/0.6ML injection Commonly known as: LOVENOX Inject 0.55 mLs (55 mg total) into the skin daily. Start taking on: January 22, 2023   fluticasone 50 MCG/ACT nasal spray Commonly known as: FLONASE Place 1 spray into both nostrils daily as needed for allergies or rhinitis.   guaiFENesin 600 MG 12 hr tablet Commonly known as: MUCINEX Take 1 tablet (600 mg total) by mouth 2 (two) times daily for 3 days.    pantoprazole 40 MG tablet Commonly known as: Protonix Take 1 tablet (40 mg total) by mouth 2 (two) times daily before a meal.   polyethylene glycol 17 g packet Commonly known as: MIRALAX / GLYCOLAX Take 17 g by mouth daily as needed for mild constipation or moderate constipation.   senna 8.6 MG Tabs tablet Commonly known as: SENOKOT Take 8.6 mg by mouth 2 (two) times daily.   Synthroid 175 MCG tablet Generic drug: levothyroxine Take 175 mcg by mouth daily.   traMADol 50 MG tablet Commonly known as: Ultram Take 0.5 tablets (25 mg total) by mouth every 12 (twelve) hours as needed for severe pain (pain score 7-10).   Trelegy Ellipta 100-62.5-25 MCG/ACT Aepb Generic drug: Fluticasone-Umeclidin-Vilant Inhale 1 puff into the lungs daily. What changed: when to take this       Allergies  Allergen Reactions   Duloxetine Hcl Other (See Comments)   Oxybutynin Other (See Comments)   Oxycodone Other (See Comments)    AMS, lethargy   Penicillins Other (See Comments)    Listed on MAR as allergy Unknown reaction    Sulfa Antibiotics Other (See Comments)    Listed on MAR as allergy Unknown reaction    Contact information for follow-up providers     MD AT SNF Follow up.          Kathryne Hitch, MD. Schedule an appointment as soon as possible for a visit in 1 week(s).   Specialty: Orthopedic Surgery Why: F/U IN 1 WEEKS. Contact information: [address] [place] [address] [phone]         Speech therapy Follow up.               Contact information for after-discharge care     Destination     [facility] Preferred SNF .   Service: Skilled Nursing Contact information: [address] [place] [address] [phone]                      The results of significant diagnostics from this hospitalization (including imaging, microbiology, ancillary and laboratory) are listed below  for reference.    Significant Diagnostic Studies: DG Swallowing Func-Speech Pathology  Result Date: 01/17/2023 Table formatting from the original result was not included. Modified Barium Swallow Study Patient Details Name: Ashli Renter MRN: [id number] Date of Birth: 19-Aug-1935 Today's Date: 01/17/2023 HPI/PMH: HPI: Annslee Mcfee is a 87 y.o.  female with medical history significant for COPD, chronic respiratory failure with hypoxia and hypercapnia on 2 L O2 via [place] at baseline, chronic HFpEF, CKD stage IIIa, HTN, HLD, anemia, OSA on trilogy vent nightly who presented to the ED from SNF for evaluation of altered mental status. .10/31-11/07/2022 was here.   PT/OT recommended SNF and patient was discharged to SNF.  Patient uses trilogy vent at night at baseline. She was not tolerating throughout the night and kept taking it off.She had a similar episode in September when she had the COVID-19 viral infection which eventually resolved on its own.Patient reports a chronic cough productive of white sputum which has not changed from baseline.  She denies chest pain, dyspnea, abdominal pain, dysuria, diarrhea.  Pt has undergone BSE yesterday - and RN reports pt with overtly coughing and ongoing concern for aspiration.  Thus SLP spoke to MD and he agreed to order MBS.  Pt had prior MBS and was coughing without asp observed.  Transient penetration previously noted. Clinical Impression: Clinical Impression: Patient presents with mild oropharynbeal dysphagia -  This represents a diminished ability and swallowing compared to prior MBS. Her oral dysphagia dysphagia is due to impaired coordination/control- allowing spillage of liquids into pharynx-- trachea poorly controlled.  Audible aspiration of thin liquid and trace silent aspiration of thin and large bolus of nectar occured.  Pharyngeal swallow dysfunction chacterized by impaired timing of swallow, inadequacy of laryngeal vestibule closure and elevation.  Postures including chin tuck  and separately head of the bed reclined to approximately 45 degrees prevents patient from aspirating liquids.  Patient was hesitant to conduct either strategy stating she looks like a woodpecker and/or she feels uncomfortable swallowing in this position.  Small single sips of nectar thick liquid was tolerated without aspiration.  Though aspiration was not gross consistent aspiration over time likely can contribute to potential pulmonary infections.  As patient is currently in the hospital recommend nectar thick liquids during meals and implement [employer] protocol.  Medicine with pure whole advised. Patient will benefit from follow-up SLP to address dysphagia and train compensation strategies for maximal airway protection.  Using teach back with recorded video patient educated to recommendations.  Of note cough was correlated to aspiration during today's evaluation-again different than prior findings. Factors that may increase risk of adverse event in presence of aspiration Rubye Oaks & Clearance Coots 2021): Factors that may increase risk of adverse event in presence of aspiration Rubye Oaks & Clearance Coots 2021): Frail or deconditioned; Respiratory or GI disease Recommendations/Plan: Swallowing Evaluation Recommendations Swallowing Evaluation Recommendations Recommendations: PO diet PO Diet Recommendation: Regular; Mildly thick liquids (Level 2, nectar thick) Liquid Administration via: Cup; Straw Medication Administration: Whole meds with puree Supervision: Intermittent supervision/cueing for swallowing strategies Swallowing strategies  : Small bites/sips; Slow rate Postural changes: Stay upright 30-60 min after meals Oral care recommendations: Oral care BID (2x/day) Treatment Plan Treatment Plan Treatment recommendations: Therapy as outlined in treatment plan below Follow-up recommendations: Skilled nursing-short term rehab (<3 hours/day) Functional status assessment: Patient has had a recent decline in their functional status  and demonstrates the ability to make significant improvements in function in a reasonable and predictable amount of time. Treatment frequency: Min 1x/week Treatment duration: 1 week Interventions: Aspiration precaution training; Compensatory techniques; Patient/family education; Oropharyngeal exercises Recommendations Recommendations for follow up therapy are one component of a multi-disciplinary discharge planning process, led by the attending physician.  Recommendations may be updated based on patient status, additional functional criteria and insurance authorization. Assessment: Orofacial Exam: Orofacial  Exam Oral Cavity: Oral Hygiene: WFL Oral Cavity - Dentition: Adequate natural dentition Orofacial Anatomy: WFL Oral Motor/Sensory Function: Other (comment) (right anterior labial seal impaired with  bolus consumption) Anatomy: Anatomy: WFL Boluses Administered: Boluses Administered Boluses Administered: Mildly thick liquids (Level 2, nectar thick); Thin liquids (Level 0); Puree; Solid  Oral Impairment Domain: Oral Impairment Domain Lip Closure: Escape from interlabial space or lateral juncture, no extension beyond vermillion border Tongue control during bolus hold: Posterior escape of less than half of bolus Bolus preparation/mastication: Slow prolonged chewing/mashing with complete recollection Bolus transport/lingual motion: Delayed initiation of tongue motion (oral holding) Oral residue: Trace residue lining oral structures Location of oral residue : Tongue Initiation of pharyngeal swallow : Pyriform sinuses (larynx)  Pharyngeal Impairment Domain: Pharyngeal Impairment Domain Soft palate elevation: No bolus between soft palate (SP)/pharyngeal wall (PW) Laryngeal elevation: Partial superior movement of thyroid cartilage/partial approximation of arytenoids to epiglottic petiole Anterior hyoid excursion: Partial anterior movement Epiglottic movement: Complete inversion Laryngeal vestibule closure: Incomplete,  narrow column air/contrast in laryngeal vestibule Pharyngeal stripping wave : Present - complete Pharyngeal contraction (A/P view only): N/A Pharyngoesophageal segment opening: -- (difficult to view) Tongue base retraction: No contrast between tongue base and posterior pharyngeal wall (PPW) Pharyngeal residue: Trace residue within or on pharyngeal structures Location of pharyngeal residue: Valleculae  Esophageal Impairment Domain: Esophageal Impairment Domain Esophageal clearance upright position: Complete clearance, esophageal coating Pill: Pill Consistency administered: Puree Puree: WFL Penetration/Aspiration Scale Score: Penetration/Aspiration Scale Score 1.  Material does not enter airway: Puree; Solid; Pill 7.  Material enters airway, passes BELOW cords and not ejected out despite cough attempt by patient: Mildly thick liquids (Level 2, nectar thick); Thin liquids (Level 0) Compensatory Strategies: Compensatory Strategies Compensatory strategies: Yes Chin tuck: Effective (when performed adequately) Effective Chin Tuck: Thin liquid (Level 0); Mildly thick liquid (Level 2, nectar thick)   General Information: No data recorded Diet Prior to this Study: Regular; Thin liquids (Level 0)   Temperature : Normal   Respiratory Status: WFL   Supplemental O2: Nasal cannula   History of Recent Intubation: No  Behavior/Cognition: Alert; Cooperative; Distractible; Other (Comment) (sleepy) Self-Feeding Abilities: Able to self-feed Baseline vocal quality/speech: Normal Volitional Cough: Able to elicit Volitional Swallow: Able to elicit Exam Limitations: Limited visibility Goal Planning: Prognosis for improved oropharyngeal function: Fair Barriers to Reach Goals: Other (Comment) (advanced age) No data recorded Patient/Family Stated Goal: to get better Consulted and agree with results and recommendations: Patient; Nurse Pain: Pain Assessment Pain Assessment: No/denies pain Faces Pain Scale: 2 Pain Location: leg Pain Descriptors /  Indicators: Aching Pain Intervention(s): Limited activity within patient's tolerance End of Session: Start Time:SLP Start Time (ACUTE ONLY): 1400 Stop Time: SLP Stop Time (ACUTE ONLY): 1425 Time Calculation:SLP Time Calculation (min) (ACUTE ONLY): 25 min Charges: SLP Evaluations $ SLP Speech Visit: 1 Visit SLP Evaluations $BSS Swallow: 1 Procedure $MBS Swallow: 1 Procedure $Swallowing Treatment: 1 Procedure SLP visit diagnosis: SLP Visit Diagnosis: Dysphagia, oropharyngeal phase (R13.12) Past Medical History: Past Medical History: Diagnosis Date  Acquired hypothyroidism   Chronic diastolic heart failure (HCC)   COPD (chronic obstructive pulmonary disease) (HCC)   Essential hypertension   Hyperlipidemia   Urinary incontinence  Past Surgical History: No past surgical history on file. Chales Abrahams 01/17/2023, 3:55 PM Rolena Infante, MS [facility] SLP Acute Rehab Services Office [phone]   DG Ankle Complete Left  Result Date: 01/17/2023 CLINICAL DATA:  Close left ankle fracture EXAM: LEFT ANKLE COMPLETE - 3 VIEW COMPARISON:  01/09/2023  x-ray FINDINGS: Overlapping cast in place obscures underlying bone detail. There is oblique displaced distal fibular metaphyseal fracture. Fracture line still present. Level of displacement is increased from previous slightly. Separate fracture of the medial malleolus is near anatomic. Preserved ankle mortise. Imaging was obtained to aid in treatment. IMPRESSION: Fracture still present along the distal fibula and medial malleolus. Cast in place. The level of the displacement of the distal fibular fracture appears increased from the prior x-ray slightly. Electronically Signed   By: Karen Kays M.D.   On: 01/17/2023 12:31   VAS [name] LOWER EXTREMITY VENOUS (DVT) (7a-7p)  Result Date: 01/15/2023  Lower Venous DVT Study Patient Name:  TASMIN ZAHRADKA  Date of Exam:   01/15/2023 Medical Rec #: [id number]   Accession #:    [id number] Date of Birth: Mar 10, 1936    Patient Gender: F Patient Age:   25  years Exam Location:  [facility] Procedure:      VAS [name] LOWER EXTREMITY VENOUS (DVT) Referring Phys: Molly Maduro LOCKWOOD --------------------------------------------------------------------------------  Indications: Edema.  Risk Factors: Surgery Trauma. Limitations: Body habitus, poor ultrasound/tissue interface and patient positioning, patient immobility, patient pain tolerance, hard cast. Comparison Study: No prior studies. Performing Technologist: Chanda Busing RVT  Examination Guidelines: A complete evaluation includes B-mode imaging, spectral Doppler, color Doppler, and power Doppler as needed of all accessible portions of each vessel. Bilateral testing is considered an integral part of a complete examination. Limited examinations for reoccurring indications may be performed as noted. The reflux portion of the exam is performed with the patient in reverse Trendelenburg.  +-----+---------------+---------+-----------+----------+--------------+ RIGHTCompressibilityPhasicitySpontaneityPropertiesThrombus Aging +-----+---------------+---------+-----------+----------+--------------+ CFV  Full           Yes      Yes                                 +-----+---------------+---------+-----------+----------+--------------+   +---------+---------------+---------+-----------+----------+-------------------+ LEFT     CompressibilityPhasicitySpontaneityPropertiesThrombus Aging      +---------+---------------+---------+-----------+----------+-------------------+ CFV      Full           Yes      Yes                                      +---------+---------------+---------+-----------+----------+-------------------+ SFJ      Full                                                             +---------+---------------+---------+-----------+----------+-------------------+ FV Prox  Full                                                              +---------+---------------+---------+-----------+----------+-------------------+ FV Mid                  Yes      Yes                                      +---------+---------------+---------+-----------+----------+-------------------+  FV Distal               Yes      Yes                                      +---------+---------------+---------+-----------+----------+-------------------+ POP                     Yes      Yes                                      +---------+---------------+---------+-----------+----------+-------------------+ PTV      Full                                                             +---------+---------------+---------+-----------+----------+-------------------+ PERO                                                  Not well visualized +---------+---------------+---------+-----------+----------+-------------------+     Summary: RIGHT: - No evidence of common femoral vein obstruction.   LEFT: - There is no evidence of deep vein thrombosis in the lower extremity. However, portions of this examination were limited- see technologist comments above.  - No cystic structure found in the popliteal fossa.  *See table(s) above for measurements and observations. Electronically signed by Coral Else MD on 01/15/2023 at 9:29:03 PM.    Final    CT Head Wo Contrast  Result Date: 01/15/2023 CLINICAL DATA:  Altered mental status. EXAM: CT HEAD WITHOUT CONTRAST TECHNIQUE: Contiguous axial images were obtained from the base of the skull through the vertex without intravenous contrast. RADIATION DOSE REDUCTION: This exam was performed according to the departmental dose-optimization program which includes automated exposure control, adjustment of the mA and/or kV according to patient size and/or use of iterative reconstruction technique. COMPARISON:  Head CT dated 08/26/2021. FINDINGS: Brain: Mild age-related atrophy and chronic microvascular ischemic changes. There is  no acute intracranial hemorrhage. No mass effect or midline shift. No extra-axial fluid collection. Vascular: No hyperdense vessel or unexpected calcification. Skull: Normal. Negative for fracture or focal lesion. Sinuses/Orbits: No acute finding. Other: None IMPRESSION: 1. No acute intracranial pathology. 2. Mild age-related atrophy and chronic microvascular ischemic changes. Electronically Signed   By: Elgie Collard M.D.   On: 01/15/2023 17:59   CT Angio Chest PE W/Cm &/Or Wo Cm  Result Date: 01/15/2023 CLINICAL DATA:  Mental status change EXAM: CT ANGIOGRAPHY CHEST WITH CONTRAST TECHNIQUE: Multidetector CT imaging of the chest was performed using the standard protocol during bolus administration of intravenous contrast. Multiplanar CT image reconstructions and MIPs were obtained to evaluate the vascular anatomy. RADIATION DOSE REDUCTION: This exam was performed according to the departmental dose-optimization program which includes automated exposure control, adjustment of the mA and/or kV according to patient size and/or use of iterative reconstruction technique. CONTRAST:  75mL OMNIPAQUE IOHEXOL 350 MG/ML SOLN COMPARISON:  None Available. FINDINGS: Cardiovascular: No evidence of central pulmonary embolus. Limits evaluation of the segmental and subsegmental pulmonary  arteries due to bolus timing and motion artifact. Mild cardiomegaly. Normal caliber thoracic aorta with moderate atherosclerotic disease. Mild coronary artery calcifications. Mediastinum/Nodes: Esophagus thyroid are unremarkable. Mildly enlarged bilateral hilar lymph nodes which are likely reactive. Reference right hilar lymph node measuring 13 mm in short axis on series 6, image 52. Lungs/Pleura: Central airways are patent. Moderate bilateral bronchial wall thickening. Clustered solid nodules of the right upper lobe largest measures 13 x 8 mm on series 14, image 58. Additional solid nodule of the right middle lobe measuring 7 mm on series 14,  image 84. Left-greater-than-right atelectasis of the lower lobes. No pleural effusion or pneumothorax. 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 Musculoskeletal: No chest wall abnormality. No acute or significant osseous findings. Review of the MIP images confirms the above findings. IMPRESSION: 1. No evidence of central pulmonary embolus. Limits evaluation of the segmental and subsegmental pulmonary arteries due to bolus timing and motion artifact. 2. Moderate bilateral bronchial wall thickening, findings can be seen in the setting of bronchitis. 3. Solid pulmonary nodules of the right upper lobe and right middle lobe, possibly infectious, largest measures up to 13 mm. Follow-up chest CT is recommended in 3 months to ensure resolution. 4. Mildly enlarged bilateral hilar lymph nodes which are likely reactive. 5. 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 6. Aortic Atherosclerosis (ICD10-I70.0). Electronically Signed   By: Allegra Lai M.D.   On: 01/15/2023 16:35   DG Chest Port 1 View  Result Date: 01/15/2023 CLINICAL DATA:  Fatigue. EXAM: PORTABLE CHEST 1 VIEW COMPARISON:  January 09, 2023. FINDINGS: Stable cardiomegaly. Minimal bibasilar subsegmental atelectasis is noted. The visualized skeletal structures are unremarkable. IMPRESSION: Minimal bibasilar subsegmental atelectasis. Electronically Signed   By: Lupita Raider M.D.   On: 01/15/2023 16:26   US RENAL  Result Date: 01/12/2023 CLINICAL DATA:  Acute kidney insufficiency EXAM: RENAL / URINARY TRACT ULTRASOUND COMPLETE COMPARISON:  None Available. FINDINGS: Right Kidney: Renal measurements: 8.2 x 4.2 x 3.8 cm = volume: 68.3 mL. There is some parenchymal atrophy of the right kidney. No collecting system dilatation or perinephric fluid. Left Kidney: Renal measurements: 9.0 x 5.7 x 5.5 cm = volume: 146 mL. Echogenicity within normal limits. No mass or hydronephrosis visualized.  Bladder: Underdistended. Other: Limited by overlapping bowel gas and soft tissue. IMPRESSION: Right-sided renal atrophy.  No collecting system dilatation. Electronically Signed   By: Karen Kays M.D.   On: 01/12/2023 12:58   CT ANKLE LEFT WO CONTRAST  Result Date: 01/10/2023 CLINICAL DATA:  Ankle fracture status post recent fall. EXAM: CT OF THE LEFT ANKLE WITHOUT CONTRAST TECHNIQUE: Multidetector CT imaging of the left ankle was performed according to the standard protocol. Multiplanar CT image reconstructions were also generated. RADIATION DOSE REDUCTION: This exam was performed according to the departmental dose-optimization program which includes automated exposure control, adjustment of the mA and/or kV according to patient size and/or use of iterative reconstruction technique. COMPARISON:  Radiographs 01/09/2023. FINDINGS: Bones/Joint/Cartilage The ankle is splinted. The bones are demineralized. There is a trimalleolar fracture. Oblique fracture of the distal fibular metadiaphysis demonstrates up to 5 mm of posterolateral displacement. Oblique fracture through the base of the medial malleolus extends into the metaphysis and demonstrates up to 5 mm of displacement. There is a comminuted fracture of the posterior malleolus with extension into the distal tibiofibular joint. This fracture demonstrates up to 5 mm of impaction of the distal articular  surface of the tibial plafond. The talar dome is intact and located. There is mild lateral subluxation of the talus relative to the tibial plafond. The additional tarsal bones appear intact. Ligaments Suboptimally assessed by CT. Muscles and Tendons As evaluated by CT, the ankle tendons appear intact and normally located. No entrapment of the tendons in the fractures is seen. There is a prominent type 1 accessory navicular. Soft tissues Moderate soft tissue swelling around the ankle without evidence of focal hematoma, foreign body or soft tissue emphysema.  IMPRESSION: 1. Trimalleolar fracture of the left ankle as described. 2. Mild lateral subluxation of the talus relative to the tibial plafond. 3. The talar dome is intact and located. 4. The ankle tendons appear intact and normally located. Electronically Signed   By: Carey Bullocks M.D.   On: 01/10/2023 15:55   DG Chest 1 View  Result Date: 01/09/2023 CLINICAL DATA:  Fall cough EXAM: CHEST  1 VIEW COMPARISON:  08/23/2021 FINDINGS: Cardiomegaly. Streaky atelectasis at the bases. No consolidation, pleural effusion or pneumothorax. Aortic atherosclerosis IMPRESSION: Cardiomegaly with streaky atelectasis at the bases. Electronically Signed   By: Jasmine Pang M.D.   On: 01/09/2023 23:46   DG Knee Complete 4 Views Left  Result Date: 01/09/2023 CLINICAL DATA:  Fall EXAM: LEFT KNEE - COMPLETE 4+ VIEW COMPARISON:  None Available. FINDINGS: No fracture or malalignment. Mild patellofemoral medial joint space degenerative change. No significant effusion IMPRESSION: No acute osseous abnormality. Electronically Signed   By: Jasmine Pang M.D.   On: 01/09/2023 23:45   DG Ankle Complete Left  Result Date: 01/09/2023 CLINICAL DATA:  Mechanical fall ankle deformity EXAM: LEFT ANKLE COMPLETE - 3+ VIEW COMPARISON:  None Available. FINDINGS: Acute fracture involving distal shaft of fibula with about 1/2 shaft diameter posterior displacement of distal fibular fracture fragment. Acute mildly displaced medial malleolar fracture is well. Mild lateral subluxation of talar dome with respect to the distal tibia. Copious soft tissue swelling IMPRESSION: Acute displaced distal fibular and medial malleolar fractures. Mild lateral subluxation of talar dome with respect to the distal tibia. Electronically Signed   By: Jasmine Pang M.D.   On: 01/09/2023 23:44    Microbiology: Recent Results (from the past 240 hour(s))  Respiratory (~20 pathogens) panel by PCR     Status: None   Collection Time: 01/16/23 12:02 PM   Specimen:  Nasopharyngeal Swab; Respiratory  Result Value Ref Range Status   Adenovirus NOT DETECTED NOT DETECTED Final   Coronavirus 229E NOT DETECTED NOT DETECTED Final    Comment: (NOTE) The Coronavirus on the Respiratory Panel, DOES NOT test for the novel  Coronavirus (2019 nCoV)    Coronavirus HKU1 NOT DETECTED NOT DETECTED Final   Coronavirus NL63 NOT DETECTED NOT DETECTED Final   Coronavirus OC43 NOT DETECTED NOT DETECTED Final   Metapneumovirus NOT DETECTED NOT DETECTED Final   Rhinovirus / Enterovirus NOT DETECTED NOT DETECTED Final   Influenza A NOT DETECTED NOT DETECTED Final   Influenza B NOT DETECTED NOT DETECTED Final   Parainfluenza Virus 1 NOT DETECTED NOT DETECTED Final   Parainfluenza Virus 2 NOT DETECTED NOT DETECTED Final   Parainfluenza Virus 3 NOT DETECTED NOT DETECTED Final   Parainfluenza Virus 4 NOT DETECTED NOT DETECTED Final   Respiratory Syncytial Virus NOT DETECTED NOT DETECTED Final   Bordetella pertussis NOT DETECTED NOT DETECTED Final   Bordetella Parapertussis NOT DETECTED NOT DETECTED Final   Chlamydophila pneumoniae NOT DETECTED NOT DETECTED Final   Mycoplasma pneumoniae NOT DETECTED  NOT DETECTED Final    Comment: Performed at [facility], [address], [place], [place] [address]  SARS Coronavirus 2 by RT PCR (hospital order, performed in [facility] hospital lab) *cepheid single result test*     Status: None   Collection Time: 01/16/23 12:02 PM  Result Value Ref Range Status   SARS Coronavirus 2 by RT PCR NEGATIVE NEGATIVE Final    Comment: (NOTE) SARS-CoV-2 target nucleic acids are NOT DETECTED.  The SARS-CoV-2 RNA is generally detectable in upper and lower respiratory specimens during the acute phase of infection. The lowest concentration of SARS-CoV-2 viral copies this assay can detect is 250 copies / mL. A negative result does not preclude SARS-CoV-2 infection and should not be used as the sole basis for treatment or other patient management  decisions.  A negative result may occur with improper specimen collection / handling, submission of specimen other than nasopharyngeal swab, presence of viral mutation(s) within the areas targeted by this assay, and inadequate number of viral copies (<250 copies / mL). A negative result must be combined with clinical observations, patient history, and epidemiological information.  Fact Sheet for Patients:   RoadLapTop.co.za  Fact Sheet for Healthcare Providers: http://kim-miller.com/  This test is not yet approved or  cleared by the [place] FDA and has been authorized for detection and/or diagnosis of SARS-CoV-2 by FDA under an Emergency Use Authorization (EUA).  This EUA will remain in effect (meaning this test can be used) for the duration of the COVID-19 declaration under Section 564(b)(1) of the Act, 21 U.S.C. section 360bbb-3(b)(1), unless the authorization is terminated or revoked sooner.  Performed at [facility], [address], [place], [place] [address]      Labs: Basic Metabolic Panel: Recent Labs  Lab 01/17/23 0610 01/18/23 0700 01/19/23 0545 01/20/23 0514 01/21/23 0512  NA 140 143 141 138 143  K 3.9 3.8 3.8 3.8 3.7  CL 99 100 97* 94* 98  CO2 33* 33* 36* 36* 38*  GLUCOSE 118* 121* 119* 108* 105*  BUN 13 11 11 17 15   CREATININE 0.77 0.64 0.83 0.90 0.69  CALCIUM 8.5* 8.6* 8.8* 8.8* 8.9  MG 2.1  --   --   --   --    Liver Function Tests: Recent Labs  Lab 01/15/23 1307  AST 18  ALT 14  ALKPHOS 78  BILITOT 0.9  PROT 6.5  ALBUMIN 2.6*   No results for input(s): "LIPASE", "AMYLASE" in the last 168 hours. No results for input(s): "AMMONIA" in the last 168 hours. CBC: Recent Labs  Lab 01/15/23 1307 01/16/23 1053 01/17/23 0610 01/18/23 0700 01/19/23 0545 01/20/23 0514  WBC 9.6 11.2* 8.0 8.5 8.3 8.1  NEUTROABS 7.5  --  6.3  --  6.6  --   HGB 8.4* 8.7* 7.9* 7.7* 8.5* 8.2*  HCT  27.1* 28.0* 25.8* 25.0* 28.0* 27.7*  MCV 96.1 95.6 97.0 96.9 97.6 98.2  PLT 218 271 230 282 303 303   Cardiac Enzymes: No results for input(s): "CKTOTAL", "CKMB", "CKMBINDEX", "TROPONINI" in the last 168 hours. BNP: BNP (last 3 results) No results for input(s): "BNP" in the last 8760 hours.  ProBNP (last 3 results) No results for input(s): "PROBNP" in the last 8760 hours.  CBG: Recent Labs  Lab 01/16/23 1600  GLUCAP 105*       Signed:  Ramiro Harvest MD.  [facility] 01/21/2023, 1:18 PM

## 2023-01-21 NOTE — TOC CM/SW Note (Signed)
 CMS list of facilities and star ratings provided to pt to review for facility preference.       [facility] [address] [place], [place] [address] [phone] Overall rating ??  Below average  [facility] at the [facility] [address] [place], [place] [address] [phone] Overall rating ?? Below average  [facility] [address] [place], [place] [address] [phone] Overall rating?? Below average  [facility] [address] [place], [place] [address] [phone] Overall rating ? Much below average  [facility] [address] [place], [place] [address] [phone] Overall rating ??? Average  [facility] and [facility] [address] [place], [place] [address] [phone] Overall rating ? Much below average   [facility] [address] [place], [place] [address] [phone] Overall rating ? Much below average  [employer] [facility] [employer] [address] [place], [place] [address] [phone] Overall rating ??? Average  [facility] [address] [place], [place] [address] [phone] Overall rating ? Much below average  [facility] [address] [place], [place] [address] [phone] Overall rating ? Much below average  [facility] [address] [place], [place] [address] [phone] Overall rating ???? Above average  [facility] [address] [place], [place] [address] [phone] Overall rating ????? Much above average  [facility] [address] [place], [place] [address] [phone] Overall rating ???? Above average  [facility] [address] [place], [place]  [address] [phone] Overall rating ????? Much above average  [facility] [address] [place], [place] [address] [phone] Overall rating ????  [facility] [address] [place], [place] [address] [phone] Overall rating ????? Much above average  [place] [facility] [address] [place], [place] [address] [phone] Overall rating ????? Much above average  [facility] [address] [place], [place] [address] [phone] Overall rating ? Much below average  [facility] [name] [place], [place] [address] [phone] Overall rating ??? Average  [facility] [address] [place], [place] [address] [phone] Overall rating ????? Much above average  [facility] [address] [place], [place] [address] [phone] Overall rating ??? Average  [facility] [address] [place], [place] [address] [phone] Overall rating ?? Below average  [facility] [address] [place], [place] [address] [phone] Overall rating ??? Average  [facility] [address] [place], [place] [address] [phone] Overall rating ????? Much above average  [facility] [address] [place], [place] [address] [phone] Overall rating ? Much below average  [facility]	[place] at the [facility] at [facility], [place] [address] [phone] Overall rating ????? Much above average  [facility] [address] [place], [place] [address] [phone] Overall rating ? Much below average  [facility] [address] [place], [place] [address] [phone] Overall rating ??? Average  [facility]  [facility] [address] [place], [place] [address] [phone] Overall rating ??  Below average  [facility] [address] [place], [place] [address] [phone] Overall rating ? Much below average  [employer] [facility] [address] [place], [place] [address] [phone] Overall rating ??? Average  [facility] [address] [place], [place] [address] [phone] Overall rating ? Much below average  [facility] [address] [place], [place] [address] [phone] Overall rating ??? Average  [facility] [address] [place], [place] [address] [phone] Overall rating ? Much below average  [place] [address] [place], [place] [address] [phone] Overall rating ????? Much above average  [facility] [address] [place], [place] [address] [phone] Overall rating ? Much below average  [facility] [address] [place], [place] [address] [phone] Overall rating ???? Above average  [facility] [address] [place], [place] [address] [phone] Overall rating ????? Above average  [facility] [address] [place], [place] [address] [phone] Overall rating ???? Above average  [facility] [address] [place], [place] [address] [phone] Overall rating ????? Much above average  [facility] [address] [place], [place] [address] [phone] Overall rating ? Much below average  [facility] [address] [place], [place] [address] [phone] Overall rating ????? Much above average  [facility] [address] [place], [place] [address] [phone] Overall rating ? Much below  average  [facility] [address] [place], [place] [address] [phone] Overall rating ????? Much above average  [facility] [address] [place], [place] [address] [phone] Overall rating ????? Much above average  [facility] [address] [place], [place] [address] [phone] Overall rating ??? Average  [facility] [address] [place], [place] [address] [phone] Overall rating ??? Average  [facility] [address] [place], [place] [address] [phone] Overall rating ???? Above average  [facility] [address] [place], [place] [address] [phone] Overall rating ?? Below average  [facility] [address] [place], [place] [address] [phone] Overall rating ???? Above average  [facility] [address] [place], [place] [address] [phone] Overall rating ?? Below average  [facility] [address] [place], [place] [address] [phone] Overall rating ??

## 2023-01-21 NOTE — Progress Notes (Signed)
This nurse has attempted to call for report twice to the receiving facility. Front desk answered both times and this nurse was forwarded to the phone of receiving nurse with no answer both times.

## 2023-01-21 NOTE — Progress Notes (Signed)
Physical Therapy Treatment Patient Details Name: Valerie Bowman MRN: [id number] DOB: September 02, 1935 Today's Date: 01/21/2023   History of Present Illness 87 yo female presents to therapy following hospital admission on 01/15/2023 due to AMS and lethargy with pt transitioning from SNF for short term rehab following hospitalization   10/31-11/5 secondary to mechanical fall at [facility]. Pt found to have  RUL and RML pulmonary nodules on CTA and possible infectious process.Fall resulted in L ankle trimalleolar fx, orthopedics recommends conservative measures at this time due to extensive edema. Pt is L LE NWB. Pt PMH includes but is not limited to: falls, hypothyroidism, COPD, HTN, chronic HFpEF and GERD.    PT Comments  Pt tolerated sitting edge of bed for 6 minutes and performed sitting balance exercises for core strengthening. Sitting tolerance limited by fatigue. Maximove for bed to chair transfer. Pt performed seated BUE/LE strengthening exercises. SpO2 94% on 2L at rest, 90% on 2L with activity. Patient will benefit from continued inpatient follow up therapy, <3 hours/day.     If plan is discharge home, recommend the following: Two people to help with walking and/or transfers;Two people to help with bathing/dressing/bathroom;Assistance with cooking/housework;Assistance with feeding;Assist for transportation;Direct supervision/assist for financial management;Direct supervision/assist for medications management;Help with stairs or ramp for entrance;Supervision due to cognitive status   Can travel by private vehicle        Equipment Recommendations  None recommended by PT    Recommendations for Other Services       Precautions / Restrictions Precautions Precautions: Fall Required Braces or Orthoses: Splint/Cast Splint/Cast: cast L ankle Splint/Cast - Date Prophylactic Dressing Applied (if applicable): 01/13/23 Restrictions Weight Bearing Restrictions: Yes LLE Weight Bearing: Non weight  bearing     Mobility  Bed Mobility Overal bed mobility: Needs Assistance Bed Mobility: Rolling, Sit to Supine, Supine to Sit Rolling: Max assist, Used rails   Supine to sit: +2 for physical assistance, +2 for safety/equipment, HOB elevated, Mod assist, Used rails Sit to supine: +2 for physical assistance, +2 for safety/equipment, Mod assist   General bed mobility comments: pt required cues and increased time for all functional mobiltiy, assist to raise trunk, then assist for BLEs into bed, pt able to initiate B LE movement toward EOB. Pt sat EOB x 6 minutes, tolerance limited by fatigue.    Transfers Overall transfer level: Needs assistance     Sit to Stand: Via lift equipment           General transfer comment: bed to recliner with maximove Transfer via Lift Equipment: Maximove  Ambulation/Gait                   Stairs             Wheelchair Mobility     Tilt Bed    Modified Rankin (Stroke Patients Only)       Balance Overall balance assessment: Needs assistance, History of Falls Sitting-balance support: Feet supported, No upper extremity supported   Sitting balance - Comments: pt sat EOB x 6 minutes, performed forward reaching activities and LAQs, mild posterior lean but pt able to self correct, SpO2 90% on 2L while sitting, 94% on 2L at rest in supine                                    Cognition Arousal: Lethargic Behavior During Therapy: Flat affect Overall Cognitive Status: Impaired/Different from baseline  Orientation Level: Situation, Disoriented to   Memory: Decreased recall of precautions, Decreased short-term memory Following Commands: Follows one step commands consistently Safety/Judgement: Decreased awareness of deficits              Exercises General Exercises - Upper Extremity Shoulder Flexion: AROM, Both, 10 reps, Seated General Exercises - Lower Extremity Long Arc Quad: AROM,  Both, 10 reps, Seated Heel Slides: AROM, Left, 10 reps, Supine    General Comments        Pertinent Vitals/Pain Pain Assessment Pain Assessment: No/denies pain    Home Living                          Prior Function            PT Goals (current goals can now be found in the care plan section) Acute Rehab PT Goals Patient Stated Goal: to go back to ALF, likes to play bingo and Skipbo PT Goal Formulation: With patient/family Time For Goal Achievement: 01/24/23 Potential to Achieve Goals: Fair Progress towards PT goals: Progressing toward goals    Frequency    Min 1X/week      PT Plan      Co-evaluation              AM-PAC PT "6 Clicks" Mobility   Outcome Measure  Help needed turning from your back to your side while in a flat bed without using bedrails?: A Lot Help needed moving from lying on your back to sitting on the side of a flat bed without using bedrails?: Total Help needed moving to and from a bed to a chair (including a wheelchair)?: Total Help needed standing up from a chair using your arms (e.g., wheelchair or bedside chair)?: Total Help needed to walk in hospital room?: Total Help needed climbing 3-5 steps with a railing? : Total 6 Click Score: 7    End of Session Equipment Utilized During Treatment: Oxygen Activity Tolerance: Patient limited by fatigue Patient left: in chair;with chair alarm set;with call bell/phone within reach;with family/visitor present Nurse Communication: Mobility status;Need for lift equipment;Weight bearing status PT Visit Diagnosis: Unsteadiness on feet (R26.81);Other abnormalities of gait and mobility (R26.89);Muscle weakness (generalized) (M62.81);History of falling (Z91.81);Difficulty in walking, not elsewhere classified (R26.2)     Time: 7829-5621 PT Time Calculation (min) (ACUTE ONLY): 26 min  Charges:    $Gait Training: 8-22 mins $Therapeutic Activity: 8-22 mins PT General Charges $$ ACUTE PT  VISIT: 1 Visit                     Tamala Ser PT 01/21/2023  Acute Rehabilitation Services  Office [phone]

## 2023-01-21 NOTE — Progress Notes (Signed)
Speech Language Pathology Treatment: Dysphagia  Patient Details Name: Valerie Bowman MRN: [id number] DOB: December 30, 1935 Today's Date: 01/21/2023 Time: 1201-1227 SLP Time Calculation (min) (ACUTE ONLY): 26 min  Assessment / Plan / Recommendation Clinical Impression  Patient seen to assess po tolerance, address dysphagia treatment and educate pt's daughter, Valerie Bowman.  Pt sleepy sitting upright in chair. Pt reports she does not like her thickened liquids - and advises she is not drinking them. Daughter states that she did drink an entire thickened drink today.  SLP advised pt to her dehydration risk without adequate liquid consumption and that [place] water protocol is in place.   Pt observed consuming thin water from 10 cc provale cup - delayed cough noted - approx 30 seconds after intake.   Pt reports she has been coughing with intake for a long time and she has tried various temperature, food items to see if it would lessen her cough to no avail.   Informed her to her increased risk of PNA is she is aspirating - due to her respiratory disease and lack of mobility.   Using teach back, all education completed.  Made copy of pt's MBS report and reviewed precautions using teach back with Valerie Bowman and pt.  Encouraged pt to use respiratory apparatuses to help with pulmonary clearance. Daughter inquired if pt would improve to baseline, advised uncertain given her ankle fx and lack of mobility with possible deconditioning.       HPI HPI: Valerie Bowman is a 87 y.o. female with medical history significant for COPD, chronic respiratory failure with hypoxia and hypercapnia on 2 L O2 via [place] at baseline, chronic HFpEF, CKD stage IIIa, HTN, HLD, anemia, OSA on trilogy vent nightly who presented to the ED from SNF for evaluation of altered mental status. .10/31-11/07/2022 was here.   PT/OT recommended SNF and patient was discharged to SNF.  Patient uses trilogy vent at night at baseline. She was not tolerating throughout the night  and kept taking it off.She had a similar episode in September when she had the COVID-19 viral infection which eventually resolved on its own.Patient reports a chronic cough productive of white sputum which has not changed from baseline.  She denies chest pain, dyspnea, abdominal pain, dysuria, diarrhea.  Pt has undergone BSE yesterday - and RN reports pt with overtly coughing and ongoing concern for aspiration.  Thus SLP spoke to MD and he agreed to order MBS.  Pt had prior MBS and was coughing without asp observed.  Transient penetration previously noted.      SLP Plan  Continue with current plan of care      Recommendations for follow up therapy are one component of a multi-disciplinary discharge planning process, led by the attending physician.  Recommendations may be updated based on patient status, additional functional criteria and insurance authorization.    Recommendations  Diet recommendations: Regular;Nectar-thick liquid (frazier water protocol) Liquids provided via: Cup;Straw Medication Administration: Whole meds with puree Supervision: Patient able to self feed;Intermittent supervision to cue for compensatory strategies Compensations: Slow rate;Small sips/bites Postural Changes and/or Swallow Maneuvers: Seated upright 90 degrees                  Oral care BID   Set up Supervision/Assistance Dysphagia, oropharyngeal phase (R13.12)     Continue with current plan of care    Valerie Infante, MS [facility] SLP Acute Rehab Services Office [phone]  Valerie Bowman  01/21/2023, 12:34 PM

## 2023-01-21 NOTE — NC FL2 (Signed)
[place] MEDICAID FL2 LEVEL OF CARE FORM     IDENTIFICATION  Patient Name: Valerie Bowman Birthdate: March 10, 1936 Sex: female Admission Date (Current Location): 01/15/2023  [facility] and [place] Number:  Producer, television/film/video and Address:  [facility],  [address] [place], [place] [address]      Provider Number: [id number]  Attending Physician Name and Address:  Rodolph Bong, MD  Relative Name and Phone Number:  Buel Ream (daughter) Ph: [phone]    Current Level of Care: Hospital Recommended Level of Care: Skilled Nursing Facility Prior Approval Number:    Date Approved/Denied:   PASRR Number: [id number]  Discharge Plan: SNF    Current Diagnoses: Patient Active Problem List   Diagnosis Date Noted   Acute metabolic encephalopathy 01/16/2023   AMS (altered mental status) 01/16/2023   Abnormal chest x-ray with multiple lung nodules 01/16/2023   Altered mental status 01/15/2023   Chronic kidney disease, stage 3a (HCC) 01/15/2023   Closed left ankle fracture 01/10/2023   Chronic cough 01/15/2022   OSA (obstructive sleep apnea) 12/19/2021   Chronic respiratory failure with hypoxia and hypercapnia (HCC) 08/23/2021   Pain in right shoulder 03/20/2021   SOB (shortness of breath) 02/28/2021   Acquired hypothyroidism    Essential hypertension    Chronic diastolic heart failure (HCC)    COPD, severe (HCC) 02/27/2021   Low back pain 02/20/2021    Orientation RESPIRATION BLADDER Height & Weight     Self, Time, Place  O2 (2L/min) Incontinent Weight: 254 lb 6.6 oz (115.4 kg) Height:  5\' 6"  (167.6 cm)  BEHAVIORAL SYMPTOMS/MOOD NEUROLOGICAL BOWEL NUTRITION STATUS      Incontinent Diet (nectar thick liquids)  AMBULATORY STATUS COMMUNICATION OF NEEDS Skin   Extensive Assist Verbally Normal                       Personal Care Assistance Level of Assistance  Bathing, Feeding, Dressing Bathing Assistance: Maximum assistance Feeding assistance:  Independent Dressing Assistance: Maximum assistance     Functional Limitations Info  Sight, Hearing, Speech Sight Info: Impaired Hearing Info: Impaired Speech Info: Adequate    SPECIAL CARE FACTORS FREQUENCY  PT (By licensed PT), OT (By licensed OT), Speech therapy     PT Frequency: 5x/wk OT Frequency: 5x/wk     Speech Therapy Frequency: 5x/wk      Contractures Contractures Info: Not present    Additional Factors Info  Code Status, Allergies Code Status Info: DNR Allergies Info: Duloxetine Hcl, Oxybutynin, Penicillins, Sulfa Antibiotics           Current Medications (01/21/2023):  This is the current hospital active medication list Current Facility-Administered Medications  Medication Dose Route Frequency Provider Last Rate Last Admin   acetaminophen (TYLENOL) tablet 650 mg  650 mg Oral Q6H PRN Charlsie Quest, MD       Or   acetaminophen (TYLENOL) suppository 650 mg  650 mg Rectal Q6H PRN Darreld Mclean R, MD       albuterol (PROVENTIL) (2.5 MG/3ML) 0.083% nebulizer solution 2.5 mg  2.5 mg Nebulization Q6H PRN Darreld Mclean R, MD   2.5 mg at 01/19/23 2011   bisacodyl (DULCOLAX) EC tablet 5 mg  5 mg Oral Daily PRN Charlsie Quest, MD       cyanocobalamin (VITAMIN B12) tablet 500 mcg  500 mcg Oral Daily Darreld Mclean R, MD   500 mcg at 01/21/23 1005   enalapril (VASOTEC) tablet 20 mg  20 mg Oral BID  Darreld Mclean R, MD   20 mg at 01/21/23 1005   enoxaparin (LOVENOX) injection 55 mg  55 mg Subcutaneous Q24H Darreld Mclean R, MD   55 mg at 01/21/23 1006   fluticasone furoate-vilanterol (BREO ELLIPTA) 100-25 MCG/ACT 1 puff  1 puff Inhalation Daily Charlsie Quest, MD   1 puff at 01/21/23 0737   ipratropium-albuterol (DUONEB) 0.5-2.5 (3) MG/3ML nebulizer solution 3 mL  3 mL Nebulization BID Rodolph Bong, MD   3 mL at 01/21/23 0737   levothyroxine (SYNTHROID) tablet 175 mcg  175 mcg Oral Q0600 Rodolph Bong, MD   175 mcg at 01/21/23 0518   ondansetron (ZOFRAN) tablet  4 mg  4 mg Oral Q6H PRN Charlsie Quest, MD       Or   ondansetron (ZOFRAN) injection 4 mg  4 mg Intravenous Q6H PRN Charlsie Quest, MD       pantoprazole (PROTONIX) EC tablet 40 mg  40 mg Oral BID AC Darreld Mclean R, MD   40 mg at 01/21/23 0831   senna (SENOKOT) tablet 8.6 mg  1 tablet Oral BID Darreld Mclean R, MD   8.6 mg at 01/21/23 1007   senna-docusate (Senokot-S) tablet 1 tablet  1 tablet Oral QHS PRN Charlsie Quest, MD         Discharge Medications: Please see discharge summary for a list of discharge medications.  Relevant Imaging Results:  Relevant Lab Results:   Additional Information SSN: [id number]  Otelia Santee, LCSW

## 2023-01-21 NOTE — Plan of Care (Signed)

## 2023-01-21 NOTE — TOC Transition Note (Signed)
Transition of Care [facility]) - CM/SW Discharge Note   Patient Details  Name: Valerie Bowman MRN: [id number] Date of Birth: 1935-06-10  [facility] CM/SW Contact:  Otelia Santee, LCSW Phone Number: 01/21/2023, 9:15 AM   Clinical Narrative:       Final next level of care: Skilled Nursing Facility Barriers to Discharge: No Barriers Identified   Patient Goals and CMS Choice CMS Medicare.gov Compare Post Acute Care list provided to::  (NA) Choice offered to / list presented to :  (NA)  Discharge Placement  Reviewed bed offers with pt's daughter. Pt's daughter has accepted bed offer at [facility]. Pt able to transfer to SNF today. Pt will be going to room 1201P. RN to call report to [phone]. DC packet with signed DNR placed at RN station. PTAR called for transportation at 1:10pm.             Patient chooses bed at: [facility]     Patient and family notified of of transfer: 01/21/23  Discharge Plan and Services Additional resources added to the After Visit Summary for   In-house Referral: Clinical Social Work Discharge Planning Services: NA Post Acute Care Choice: Skilled Nursing Facility          DME Arranged: N/A DME Agency: NA                  Social Determinants of Health (SDOH) Interventions SDOH Screenings   Food Insecurity: No Food Insecurity (01/16/2023)  Housing: Low Risk  (01/16/2023)  Transportation Needs: No Transportation Needs (01/16/2023)  Utilities: Not At Risk (01/16/2023)  Financial Resource Strain: Low Risk  (09/10/2020)   Received from [facility]  Physical Activity: Inactive (09/10/2020)   Received from [facility]  Social Connections: Moderately Isolated (09/10/2020)   Received from [facility]  Stress: No Stress Concern Present (09/10/2020)   Received from [facility]  Tobacco Use: Medium Risk (01/15/2023)     Readmission Risk Interventions     No data to display

## 2023-01-29 ENCOUNTER — Encounter: Payer: Self-pay | Admitting: Orthopaedic Surgery

## 2023-01-29 ENCOUNTER — Other Ambulatory Visit (INDEPENDENT_AMBULATORY_CARE_PROVIDER_SITE_OTHER): Payer: Self-pay

## 2023-01-29 ENCOUNTER — Ambulatory Visit (INDEPENDENT_AMBULATORY_CARE_PROVIDER_SITE_OTHER): Payer: Medicare Other | Admitting: Orthopaedic Surgery

## 2023-01-29 ENCOUNTER — Encounter: Payer: Self-pay | Admitting: Internal Medicine

## 2023-01-29 DIAGNOSIS — S82892D Other fracture of left lower leg, subsequent encounter for closed fracture with routine healing: Secondary | ICD-10-CM | POA: Diagnosis not present

## 2023-01-29 DIAGNOSIS — M25572 Pain in left ankle and joints of left foot: Secondary | ICD-10-CM | POA: Diagnosis not present

## 2023-01-29 DIAGNOSIS — G4733 Obstructive sleep apnea (adult) (pediatric): Secondary | ICD-10-CM

## 2023-01-29 NOTE — Progress Notes (Signed)
The patient is an 87 year old female who is almost 3 weeks out from a mechanical fall in which she sustained a complex left ankle fracture.  Due to significant comorbidities and oxygen requirements, we elected to treat this nonoperatively and she and her daughter agree with this.  We placed her in a well-padded cast after she has been in the splint for multiple days.  We are pleased with her alignment in the cast.  She still has no complaints of the cast other than feeling heavy.  This is her first follow-up since seeing her in the hospital as a consultation.  On exam her cast is intact and it is clean.  I can put my fingers around the top of the cast and did not find any evidence proximally of any wounds or cast wear onto the soft tissue.  Her toes are well-perfused.  She says it does not feel like there is any significant issues going on in the ankle in terms of increased pain or wounds.  She is quite sleepy in the office today but has not been using her oxygen is much as night as she showed her mother says.  X-rays of the ankle showed the ankle alignment is acceptable especially at the ankle mortise.  There is a lateral and medial malleolus fracture and likely something going on slightly posteriorly suggesting a Tri [place] fracture.  It is then again a alignment that should allow for healing for just stability purposes since she is such a low mobility type of patient.  We will leave her in that cast for 2 more weeks.  When we see her back in 2 weeks we need to have that cast removed and then get a repeat 3 views of her left ankle.  At that visit we can determine whether or not she needs to go back into a cast versus a cam boot.

## 2023-01-31 NOTE — Telephone Encounter (Signed)
Patient is not tolerating trelegy ventilatory at night well. [employer] came and adjust it but she is still having issues. Daughter would like advise on what to do. Please call and advise [phone]

## 2023-02-04 NOTE — Telephone Encounter (Signed)
Please advise, thank you.

## 2023-02-04 NOTE — Telephone Encounter (Signed)
Called and spoke with daughter [name]. I am recommending hospice care which is going to arrange through her current skilled nursing.

## 2023-02-12 ENCOUNTER — Other Ambulatory Visit (INDEPENDENT_AMBULATORY_CARE_PROVIDER_SITE_OTHER): Payer: Medicare Other

## 2023-02-12 ENCOUNTER — Ambulatory Visit: Payer: Medicare Other | Admitting: Orthopaedic Surgery

## 2023-02-12 ENCOUNTER — Encounter: Payer: Self-pay | Admitting: Orthopaedic Surgery

## 2023-02-12 DIAGNOSIS — M25572 Pain in left ankle and joints of left foot: Secondary | ICD-10-CM | POA: Diagnosis not present

## 2023-02-12 NOTE — Addendum Note (Signed)
Addended by: Bonney Leitz on: 02/12/2023 06:36 PM   Modules accepted: Orders

## 2023-02-12 NOTE — Telephone Encounter (Signed)
Yes ok to discontinue home vent. She is going to hospice and no longer needs to use this.

## 2023-02-12 NOTE — Progress Notes (Signed)
The patient is an 87 year old female who is almost 5 weeks out from a complex left ankle fracture.  This was more of a trimalleolar fracture.  We have needed to treat this conservatively with casting given the patient's significant comorbidities.  Her daughter is with her today.  She is staying in a nursing care facility.  She is denying any left ankle pain and more of her issues are related to her declining health.  We did have the cast removed today.  Her skin is intact over her left ankle.  Clinically is well located.  She is having no pain when I stressed the left ankle.  3 views of left ankle show fortunately the ankle is located and the mortise is near congruent.  There is interval healing of the fracture.  We will still have her nonweightbearing for the next 4 weeks.  However I do believe putting her in a cam walking boot would be worthwhile and instead of a cast.  We will see her back in 4 weeks with 3 views of the left ankle.  Her daughter agrees with the treatment plan as well and I did fill out instructions for the skilled nursing facility.

## 2023-02-25 ENCOUNTER — Ambulatory Visit: Payer: Medicare Other | Admitting: Internal Medicine

## 2023-03-12 DEATH — deceased

## 2023-03-13 ENCOUNTER — Ambulatory Visit: Payer: Medicare Other | Admitting: Orthopaedic Surgery
